# Patient Record
Sex: Female | Born: 1950 | Race: White | Hispanic: No | Marital: Married | Smoking: Never smoker
Health system: Southern US, Community
[De-identification: ages and names within clinical notes are randomized; demographics above are authoritative.]

## PROBLEM LIST (undated history)

## (undated) DIAGNOSIS — N189 Chronic kidney disease, unspecified: Secondary | ICD-10-CM

## (undated) DIAGNOSIS — Z8669 Personal history of other diseases of the nervous system and sense organs: Secondary | ICD-10-CM

## (undated) DIAGNOSIS — H269 Unspecified cataract: Secondary | ICD-10-CM

## (undated) DIAGNOSIS — E785 Hyperlipidemia, unspecified: Secondary | ICD-10-CM

## (undated) DIAGNOSIS — H9190 Unspecified hearing loss, unspecified ear: Secondary | ICD-10-CM

## (undated) DIAGNOSIS — Z9889 Other specified postprocedural states: Secondary | ICD-10-CM

## (undated) DIAGNOSIS — Z8619 Personal history of other infectious and parasitic diseases: Secondary | ICD-10-CM

## (undated) DIAGNOSIS — R112 Nausea with vomiting, unspecified: Secondary | ICD-10-CM

## (undated) DIAGNOSIS — M8580 Other specified disorders of bone density and structure, unspecified site: Secondary | ICD-10-CM

## (undated) DIAGNOSIS — T8859XA Other complications of anesthesia, initial encounter: Secondary | ICD-10-CM

## (undated) HISTORY — DX: Unspecified cataract: H26.9

## (undated) HISTORY — PX: INNER EAR SURGERY: SHX679

## (undated) HISTORY — DX: Unspecified hearing loss, unspecified ear: H91.90

## (undated) HISTORY — DX: Personal history of other infectious and parasitic diseases: Z86.19

## (undated) HISTORY — DX: Other specified disorders of bone density and structure, unspecified site: M85.80

## (undated) HISTORY — DX: Chronic kidney disease, unspecified: N18.9

## (undated) HISTORY — DX: Hyperlipidemia, unspecified: E78.5

## (undated) HISTORY — PX: COLONOSCOPY: SHX174

## (undated) HISTORY — DX: Personal history of other diseases of the nervous system and sense organs: Z86.69

---

## 1999-11-30 ENCOUNTER — Other Ambulatory Visit: Admission: RE | Admit: 1999-11-30 | Discharge: 1999-11-30 | Payer: Self-pay | Admitting: Obstetrics and Gynecology

## 2001-01-20 ENCOUNTER — Other Ambulatory Visit: Admission: RE | Admit: 2001-01-20 | Discharge: 2001-01-20 | Payer: Self-pay | Admitting: Obstetrics and Gynecology

## 2005-06-30 ENCOUNTER — Emergency Department (HOSPITAL_COMMUNITY): Admission: EM | Admit: 2005-06-30 | Discharge: 2005-06-30 | Payer: Self-pay | Admitting: Emergency Medicine

## 2006-03-06 ENCOUNTER — Encounter: Admission: RE | Admit: 2006-03-06 | Discharge: 2006-03-06 | Payer: Self-pay | Admitting: Internal Medicine

## 2006-04-15 ENCOUNTER — Other Ambulatory Visit: Admission: RE | Admit: 2006-04-15 | Discharge: 2006-04-15 | Payer: Self-pay | Admitting: Internal Medicine

## 2006-04-15 ENCOUNTER — Encounter: Payer: Self-pay | Admitting: Internal Medicine

## 2006-04-15 ENCOUNTER — Ambulatory Visit: Payer: Self-pay | Admitting: Internal Medicine

## 2006-04-15 LAB — CONVERTED CEMR LAB: Pap Smear: NORMAL

## 2008-07-01 ENCOUNTER — Ambulatory Visit: Payer: Self-pay | Admitting: Internal Medicine

## 2008-07-01 DIAGNOSIS — H9190 Unspecified hearing loss, unspecified ear: Secondary | ICD-10-CM | POA: Insufficient documentation

## 2008-07-01 DIAGNOSIS — M79609 Pain in unspecified limb: Secondary | ICD-10-CM | POA: Insufficient documentation

## 2008-07-01 DIAGNOSIS — G4700 Insomnia, unspecified: Secondary | ICD-10-CM

## 2008-07-05 ENCOUNTER — Telehealth: Payer: Self-pay | Admitting: Internal Medicine

## 2008-07-05 LAB — CONVERTED CEMR LAB
Basophils Absolute: 0 10*3/uL (ref 0.0–0.1)
Basophils Relative: 1 % (ref 0–1)
Free T4: 1.1 ng/dL (ref 0.89–1.80)
HCT: 39.9 % (ref 36.0–46.0)
Lymphocytes Relative: 36 % (ref 12–46)
MCHC: 32.3 g/dL (ref 30.0–36.0)
MCV: 91.5 fL (ref 78.0–100.0)
Monocytes Absolute: 0.5 10*3/uL (ref 0.1–1.0)
Neutro Abs: 3 10*3/uL (ref 1.7–7.7)
Neutrophils Relative %: 52 % (ref 43–77)
T3, Free: 2.9 pg/mL (ref 2.3–4.2)

## 2008-08-01 ENCOUNTER — Ambulatory Visit: Payer: Self-pay | Admitting: Internal Medicine

## 2008-08-09 ENCOUNTER — Telehealth: Payer: Self-pay | Admitting: Family Medicine

## 2008-08-16 ENCOUNTER — Telehealth: Payer: Self-pay | Admitting: Internal Medicine

## 2008-08-16 ENCOUNTER — Encounter: Payer: Self-pay | Admitting: Internal Medicine

## 2008-08-26 ENCOUNTER — Telehealth: Payer: Self-pay | Admitting: *Deleted

## 2008-08-26 ENCOUNTER — Ambulatory Visit: Payer: Self-pay | Admitting: Internal Medicine

## 2008-08-26 DIAGNOSIS — J069 Acute upper respiratory infection, unspecified: Secondary | ICD-10-CM | POA: Insufficient documentation

## 2008-10-03 ENCOUNTER — Telehealth: Payer: Self-pay | Admitting: Internal Medicine

## 2009-02-21 ENCOUNTER — Ambulatory Visit: Payer: Self-pay | Admitting: Internal Medicine

## 2009-02-21 LAB — CONVERTED CEMR LAB
AST: 23 units/L (ref 0–37)
Albumin: 4.2 g/dL (ref 3.5–5.2)
Bilirubin Urine: NEGATIVE
Bilirubin, Direct: 0.1 mg/dL (ref 0.0–0.3)
Blood in Urine, dipstick: NEGATIVE
CO2: 31 meq/L (ref 19–32)
Cholesterol: 227 mg/dL — ABNORMAL HIGH (ref 0–200)
Creatinine, Ser: 0.9 mg/dL (ref 0.4–1.2)
Eosinophils Relative: 2.9 % (ref 0.0–5.0)
GFR calc non Af Amer: 68.37 mL/min (ref >60)
Glucose, Bld: 102 mg/dL — ABNORMAL HIGH (ref 70–99)
Glucose, Urine, Semiquant: NEGATIVE
Monocytes Absolute: 0.4 10*3/uL (ref 0.1–1.0)
Monocytes Relative: 8.8 % (ref 3.0–12.0)
Neutrophils Relative %: 48.2 % (ref 43.0–77.0)
Sodium: 146 meq/L — ABNORMAL HIGH (ref 135–145)
Total Bilirubin: 0.8 mg/dL (ref 0.3–1.2)
Total Protein: 7.2 g/dL (ref 6.0–8.3)
Urobilinogen, UA: 0.2
VLDL: 29.8 mg/dL (ref 0.0–40.0)
WBC: 4.4 10*3/uL — ABNORMAL LOW (ref 4.5–10.5)

## 2009-02-28 ENCOUNTER — Encounter: Payer: Self-pay | Admitting: Internal Medicine

## 2009-02-28 ENCOUNTER — Other Ambulatory Visit: Admission: RE | Admit: 2009-02-28 | Discharge: 2009-02-28 | Payer: Self-pay | Admitting: Internal Medicine

## 2009-02-28 ENCOUNTER — Ambulatory Visit: Payer: Self-pay | Admitting: Internal Medicine

## 2009-02-28 DIAGNOSIS — R82998 Other abnormal findings in urine: Secondary | ICD-10-CM

## 2009-02-28 DIAGNOSIS — T50995A Adverse effect of other drugs, medicaments and biological substances, initial encounter: Secondary | ICD-10-CM

## 2009-02-28 DIAGNOSIS — N952 Postmenopausal atrophic vaginitis: Secondary | ICD-10-CM

## 2009-03-01 ENCOUNTER — Encounter: Payer: Self-pay | Admitting: Internal Medicine

## 2009-03-01 LAB — CONVERTED CEMR LAB
Creatinine, Urine: 175.8 mg/dL
Total Protein, Urine: 3

## 2009-03-13 ENCOUNTER — Telehealth: Payer: Self-pay | Admitting: *Deleted

## 2009-09-13 ENCOUNTER — Telehealth: Payer: Self-pay | Admitting: *Deleted

## 2010-03-16 ENCOUNTER — Telehealth: Payer: Self-pay | Admitting: *Deleted

## 2010-04-16 ENCOUNTER — Telehealth: Payer: Self-pay | Admitting: *Deleted

## 2010-04-25 ENCOUNTER — Ambulatory Visit: Payer: Self-pay | Admitting: Internal Medicine

## 2010-04-25 LAB — CONVERTED CEMR LAB
AST: 25 units/L (ref 0–37)
Albumin: 4.7 g/dL (ref 3.5–5.2)
Basophils Absolute: 0 10*3/uL (ref 0.0–0.1)
Basophils Relative: 0.9 % (ref 0.0–3.0)
Bilirubin, Direct: 0.1 mg/dL (ref 0.0–0.3)
Blood in Urine, dipstick: NEGATIVE
Chloride: 104 meq/L (ref 96–112)
Eosinophils Absolute: 0.2 10*3/uL (ref 0.0–0.7)
Eosinophils Relative: 3.9 % (ref 0.0–5.0)
GFR calc non Af Amer: 74.76 mL/min (ref >60)
Glucose, Bld: 100 mg/dL — ABNORMAL HIGH (ref 70–99)
Glucose, Urine, Semiquant: NEGATIVE
Hemoglobin: 13.9 g/dL (ref 12.0–15.0)
Ketones, urine, test strip: NEGATIVE
Lymphocytes Relative: 41 % (ref 12.0–46.0)
MCHC: 34.3 g/dL (ref 30.0–36.0)
Nitrite: NEGATIVE
Platelets: 321 10*3/uL (ref 150.0–400.0)
Protein, U semiquant: NEGATIVE
Sodium: 143 meq/L (ref 135–145)
Specific Gravity, Urine: >=1.03
TSH: 1.37 microintl units/mL (ref 0.35–5.50)
Total CHOL/HDL Ratio: 4
WBC Urine, dipstick: NEGATIVE

## 2010-05-01 ENCOUNTER — Ambulatory Visit: Payer: Self-pay | Admitting: Internal Medicine

## 2010-05-01 DIAGNOSIS — E785 Hyperlipidemia, unspecified: Secondary | ICD-10-CM | POA: Insufficient documentation

## 2010-06-06 ENCOUNTER — Emergency Department (HOSPITAL_BASED_OUTPATIENT_CLINIC_OR_DEPARTMENT_OTHER): Admission: EM | Admit: 2010-06-06 | Discharge: 2010-06-06 | Payer: Self-pay | Admitting: Emergency Medicine

## 2010-06-11 ENCOUNTER — Encounter (INDEPENDENT_AMBULATORY_CARE_PROVIDER_SITE_OTHER): Payer: Self-pay | Admitting: *Deleted

## 2010-08-21 ENCOUNTER — Encounter: Payer: Self-pay | Admitting: Internal Medicine

## 2010-09-24 ENCOUNTER — Telehealth: Payer: Self-pay | Admitting: *Deleted

## 2010-11-18 ENCOUNTER — Encounter: Payer: Self-pay | Admitting: Internal Medicine

## 2010-11-23 ENCOUNTER — Telehealth: Payer: Self-pay | Admitting: *Deleted

## 2010-11-27 NOTE — Progress Notes (Signed)
Summary: refill on zolpidem   Phone Note From Pharmacy   [id number] Reason for Call: Needs renewal Details for Reason: zolpidem 10mg  Summary of Call: last filled on 08/21/10 #30 Initial call taken by: Romualdo Bolk, CMA (AAMA),  September 24, 2010 10:56 AM  Follow-up for Phone Call        refill x 2   Additional Follow-up for Phone Call Additional follow up Details #1::        Rx faxed back to pharmacy. Additional Follow-up by: Romualdo Bolk, CMA (AAMA),  September 24, 2010 1:03 PM    Prescriptions: AMBIEN 10 MG TABS (ZOLPIDEM TARTRATE) 1 by mouth once daily  #30 x 1   Entered by:   Romualdo Bolk, CMA (AAMA)   Authorized by:   Madelin Headings MD   Signed by:   Romualdo Bolk, CMA (AAMA) on 09/24/2010   Method used:   Handwritten   RxID:   [id number]

## 2010-11-27 NOTE — Assessment & Plan Note (Signed)
Summary: CPX // RS   Vital Signs:  Patient profile:   60 year old female Menstrual status:  postmenopausal Height:      63.25 inches Weight:      167 pounds BMI:     29.46 Pulse rate:   84 / minute Pulse rhythm:   regular BP sitting:   110 / 80  (left arm) Cuff size:   regular  Vitals Entered By: Raechel Ache, RN (May 01, 2010 10:15 AM)  Nutrition Counseling: Patient's BMI is greater than 25 and therefore counseled on weight management options. CC: CPX, labs done. LMP - Character: unsure Menarche (age onset years): 11    Menstrual Status postmenopausal Last PAP Result NEGATIVE FOR INTRAEPITHELIAL LESIONS OR MALIGNANCY.   History of Present Illness: Tracy Pena comes in today  for preventive visit . Since last visit  here  there have been no major changes in health status   Sleep: taking ambien every night   and helps .  Not as much exercise and eating more sweets at times . no major health concerns . To get mammo next week  Never had a colonscopy although referred in the past .  delayed for various reasons.  Preventive Screening-Counseling & Management  Alcohol-Tobacco     Alcohol drinks/day: 1     Alcohol type: wine     Smoking Status: never  Caffeine-Diet-Exercise     Caffeine use/day: 2 cups coffee     Does Patient Exercise: yes     Type of exercise: biking, walking     Times/week: 3  Hep-HIV-STD-Contraception     Dental Visit-last 6 months yes     Sun Exposure-Excessive: no  Safety-Violence-Falls     Seat Belt Use: yes     Firearms in the Home: firearms in the home     Firearm Counseling: not indicated; uses recommended firearm safety measures     Smoke Detectors: yes      Blood Transfusions:  no.    Allergies: No Known Drug Allergies  Past History:  Past medical, surgical, family and social histories (including risk factors) reviewed, and no changes noted (except as noted below).  Past Medical History: Chickenpox as a  child childbirth  Decreased hearing       Past Surgical History: Reviewed history from 07/01/2008 and no changes required. G3 P3 Ear Surgery at [facility] for cholesteatoma  artificial ear drum  Family History: Reviewed history from 02/28/2009 and no changes required. Family History Lung cancer- Mother died Father older age Thyroid  daughter graves.   other thyroid in family No osteoporosis  No premature vascular disease  No colon cancer   Social History: Reviewed history from 02/28/2009 and no changes required. Former Smoker- tried retired x 2 years  HHof 2  and dog    used to sleep 6-8 hours pm  help take care of grandchild born in February. off an on   Caffeine use/day:  2 cups coffee Dental Care w/in 6 mos.:  yes Sun Exposure-Excessive:  no Blood Transfusions:  no  Review of Systems  The patient denies anorexia, fever, weight loss, vision loss, hoarseness, chest pain, syncope, dyspnea on exertion, peripheral edema, prolonged cough, headaches, hemoptysis, abdominal pain, melena, hematochezia, severe indigestion/heartburn, hematuria, incontinence, genital sores, muscle weakness, suspicious skin lesions, transient blindness, difficulty walking, depression, unusual weight change, abnormal bleeding, enlarged lymph nodes, angioedema, and breast masses.         decrease hearing no change  Physical Exam General Appearance: well  developed, well nourished, no acute distress Eyes: conjunctiva and lids normal, PERRLA, EOMI, fWNL Ears, Nose, Mouth, Throat: TM clear, nares clear, oral exam WNL Neck: supple, no lymphadenopathy, no thyromegaly, no JVD Respiratory: clear to auscultation and percussion, respiratory effort normal Cardiovascular: regular rate and rhythm, S1-S2, no murmur, rub or gallop, no bruits, peripheral pulses normal and symmetric, no cyanosis, clubbing, edema or varicosities Chest: no scars, masses, tenderness; no asymmetry, skin changes, nipple discharge    Gastrointestinal: soft, non-tender; no hepatosplenomegaly, masses; active bowel sounds all quadrants,  Lymphatic: no cervical, axillary or inguinal adenopathy Musculoskeletal: gait normal, muscle tone and strength WNL, no joint swelling, effusions, discoloration, crepitus  Skin: clear, good turgor, color WNL, no  lesions, or ulcerations Neurologic: normal mental status, normal reflexes, normal strength, sensation, and motion Psychiatric: alert; oriented to person, place and time Other Exam:  see labs   elevated TG  ldl  rest normal     Impression & Recommendations:  Problem # 1:  Preventive Health Care (ICD-V70.0) Discussed nutrition,exercise,diet,healthy weight, vitamin D and calcium.  never had dexa no family hx  will get dexa next year   needs colonscopy   Problem # 2:  SLEEPLESSNESS (ICD-780.52) counseled  continue  Her updated medication list for this problem includes:    Ambien 10 Mg Tabs (Zolpidem tartrate) .Marland Kitchen... 1 by mouth once daily  Problem # 3:  HYPERLIPIDEMIA (ICD-272.4) counseled   Labs Reviewed: SGOT: 25 (04/25/2010)   SGPT: 20 (04/25/2010)   HDL:64.80 (04/25/2010), 55.10 (02/21/2009)  Chol:247 (04/25/2010), 227 (02/21/2009)  Trig:186.0 (04/25/2010), 149.0 (02/21/2009)  Complete Medication List: 1)  Ambien 10 Mg Tabs (Zolpidem tartrate) .Marland Kitchen.. 1 by mouth once daily  Other Orders: Gastroenterology Referral (GI)  Patient Instructions: 1)  get dexa next year  call for  RX .  2)  Increase exercise  ,decrease sugars and processed foods. 3)  weight loss would help. 4)  Will refer for screening colonoscopy 5)  Cpx with labs in a year   or as needed .       Preventive Care Screening  Prior Values:    Pap Smear:  NEGATIVE FOR INTRAEPITHELIAL LESIONS OR MALIGNANCY. (02/28/2009)    Mammogram:  normal (10/28/2005)    Last Tetanus Booster:  Historical (10/28/2004)

## 2010-11-27 NOTE — Progress Notes (Signed)
Summary: [employer] denied coverage of lunesta  Phone Note From Other Clinic   Caller: [employer] Call For: Tracy Pena  Summary of Call: [employer] denied coverage of lunesta.  Paperwork for denial sent to [facility]  Initial call taken by: Roselle Locus,  August 16, 2008 9:39 AM  Follow-up for Phone Call        I called pt's home number and pt aware of this and wants to try ambien. [facility] [address] Per Dr. Fabian Sharp- Ambien 10mg  1 by mouth at bedtime with 0 refills. Rx called in. Follow-up by: Romualdo Bolk, CMA,  August 18, 2008 12:25 PM    New/Updated Medications: AMBIEN 10 MG TABS (ZOLPIDEM TARTRATE) 1 by mouth once daily   Prescriptions: AMBIEN 10 MG TABS (ZOLPIDEM TARTRATE) 1 by mouth once daily  #30 x 0   Entered by:   Romualdo Bolk, CMA   Authorized by:   Madelin Headings MD   Signed by:   Romualdo Bolk, CMA on 08/18/2008   Method used:   Telephoned to ...       [facility] [phone]* (retail)       [address]       [place], [place]  [address]       Ph: [phone] or [phone]       Fax: [phone]   RxID:   [id number]

## 2010-11-27 NOTE — Progress Notes (Signed)
Summary: refill on zolpidem  Phone Note From Pharmacy   [id number] Reason for Call: Needs renewal Details for Reason: Zolpidem 10mg  Summary of Call: last filled on 03/16/10 #30 Initial call taken by: Romualdo Bolk, CMA (AAMA),  April 16, 2010 5:13 PM  Follow-up for Phone Call        ok to refill x 5  Follow-up by: Madelin Headings MD,  April 17, 2010 6:19 PM  Additional Follow-up for Phone Call Additional follow up Details #1::        Rx faxed to pharmacy. Additional Follow-up by: Romualdo Bolk, CMA (AAMA),  April 18, 2010 9:27 AM    Prescriptions: AMBIEN 10 MG TABS (ZOLPIDEM TARTRATE) 1 by mouth once daily  #30 x 4   Entered by:   Romualdo Bolk, CMA (AAMA)   Authorized by:   Madelin Headings MD   Signed by:   Romualdo Bolk, CMA (AAMA) on 04/18/2010   Method used:   Handwritten   RxID:   [id number]

## 2010-11-27 NOTE — Progress Notes (Signed)
Summary: refill on zolpidem  Phone Note From Pharmacy   [id number] Reason for Call: Needs renewal Details for Reason: zolpidem 10mg  Summary of Call: last filled on 02/12/10 #30 Initial call taken by: Romualdo Bolk, CMA (AAMA),  Mar 16, 2010 11:56 AM  Follow-up for Phone Call        ok x 1  Follow-up by: Madelin Headings MD,  Mar 16, 2010 4:49 PM  Additional Follow-up for Phone Call Additional follow up Details #1::        Rx faxed back to pharmacy. Additional Follow-up by: Romualdo Bolk, CMA (AAMA),  Mar 16, 2010 4:53 PM    Prescriptions: AMBIEN 10 MG TABS (ZOLPIDEM TARTRATE) 1 by mouth once daily  #30 x 0   Entered by:   Romualdo Bolk, CMA (AAMA)   Authorized by:   Madelin Headings MD   Signed by:   Romualdo Bolk, CMA (AAMA) on 03/16/2010   Method used:   Handwritten   RxID:   [id number]

## 2010-11-27 NOTE — Letter (Signed)
Summary: Previsit letter  [facility]  [address] [place], [place] [address]   Phone: [phone]  Fax: [phone]       06/11/2010 MRN: [id number]  Tracy Pena [address] [place], [place]  [address]  Dear [name],  Welcome to the Gastroenterology Division at [facility].    You are scheduled to see a nurse for your pre-procedure visit on 07/11/2010 at 10:30AM on the 3rd floor at [facility], [address]  We ask that you try to arrive at our office 15 minutes prior to your appointment time to allow for check-in.  Your nurse visit will consist of discussing your medical and surgical history, your immediate family medical history, and your medications.    Please bring a complete list of all your medications or, if you prefer, bring the medication bottles and we will list them.  We will need to be aware of both prescribed and over the counter drugs.  We will need to know exact dosage information as well.  If you are on blood thinners (Coumadin, Plavix, Aggrenox, Ticlid, etc.) please call our office today/prior to your appointment, as we need to consult with your physician about holding your medication.   Please be prepared to read and sign documents such as consent forms, a financial agreement, and acknowledgement forms.  If necessary, and with your consent, a friend or relative is welcome to sit-in on the nurse visit with you.  Please bring your insurance card so that we may make a copy of it.  If your insurance requires a referral to see a specialist, please bring your referral form from your primary care physician.  No co-pay is required for this nurse visit.     If you cannot keep your appointment, please call [phone] to cancel or reschedule prior to your appointment date.  This allows [place] the opportunity to schedule an appointment for another patient in need of care.    Thank you for choosing  Gastroenterology for your  medical needs.  We appreciate the opportunity to care for you.  Please visit [place] at our website  to learn more about our practice.                     Sincerely.                                                                                                                   The Gastroenterology Division

## 2010-11-29 NOTE — Progress Notes (Signed)
Summary: refill on zolpidem  Phone Note From Pharmacy   [id number] Reason for Call: Needs renewal Details for Reason: zolpidem 10mg  Summary of Call: last filled on 10/25/10 #30 Initial call taken by: Romualdo Bolk, CMA (AAMA),  November 23, 2010 4:13 PM  Follow-up for Phone Call        ok x 6   then due for yearly check  before  runs out. Follow-up by: Madelin Headings MD,  November 23, 2010 4:52 PM  Additional Follow-up for Phone Call Additional follow up Details #1::        Rx faxed to pharmacy. Additional Follow-up by: Romualdo Bolk, CMA (AAMA),  November 23, 2010 4:59 PM    Prescriptions: AMBIEN 10 MG TABS (ZOLPIDEM TARTRATE) 1 by mouth once daily  #30 x 5   Entered by:   Romualdo Bolk, CMA (AAMA)   Authorized by:   Madelin Headings MD   Signed by:   Romualdo Bolk, CMA (AAMA) on 11/23/2010   Method used:   Handwritten   RxID:   [id number]

## 2011-02-06 ENCOUNTER — Ambulatory Visit (AMBULATORY_SURGERY_CENTER): Payer: 59 | Admitting: *Deleted

## 2011-02-06 VITALS — Ht 64.0 in | Wt 163.0 lb

## 2011-02-06 DIAGNOSIS — Z1211 Encounter for screening for malignant neoplasm of colon: Secondary | ICD-10-CM

## 2011-02-06 MED ORDER — PEG-KCL-NACL-NASULF-NA ASC-C 100 G PO SOLR: ORAL | Status: DC

## 2011-02-19 ENCOUNTER — Encounter: Payer: Self-pay | Admitting: Internal Medicine

## 2011-02-20 ENCOUNTER — Encounter: Payer: Self-pay | Admitting: Internal Medicine

## 2011-02-20 ENCOUNTER — Ambulatory Visit (AMBULATORY_SURGERY_CENTER): Payer: 59 | Admitting: Internal Medicine

## 2011-02-20 VITALS — BP 149/62 | HR 75 | Temp 98.2°F | Resp 16 | Ht 64.5 in | Wt 160.0 lb

## 2011-02-20 DIAGNOSIS — Z1211 Encounter for screening for malignant neoplasm of colon: Secondary | ICD-10-CM

## 2011-02-20 MED ORDER — SODIUM CHLORIDE 0.9 % IV SOLN: 500.0000 mL | INTRAVENOUS | Status: DC

## 2011-02-20 NOTE — Patient Instructions (Signed)
Findings:  Normal  Recommendations:  High Fiber Diet                                   Repeat colonoscopy in 10 years

## 2011-02-21 ENCOUNTER — Telehealth: Payer: Self-pay | Admitting: *Deleted

## 2011-02-21 NOTE — Telephone Encounter (Signed)

## 2011-05-22 ENCOUNTER — Telehealth: Payer: Self-pay | Admitting: *Deleted

## 2011-05-22 MED ORDER — ZOLPIDEM TARTRATE 10 MG PO TABS: 10.0000 mg | ORAL_TABLET | Freq: Every evening | ORAL | Status: DC | PRN

## 2011-05-22 NOTE — Telephone Encounter (Signed)
Zolpidem 10mg  last filled on 04/23/11 #30 Nov 11/12 lov 05/01/10

## 2011-05-22 NOTE — Telephone Encounter (Signed)
Ok to refill x 3  Until next appt.  

## 2011-05-27 ENCOUNTER — Telehealth: Payer: Self-pay | Admitting: Internal Medicine

## 2011-05-27 NOTE — Telephone Encounter (Signed)
Pt needs refill on zolpidem (AMBIEN) 10 MG tablet is completely out and has not slept in days.

## 2011-05-27 NOTE — Telephone Encounter (Signed)
Pt aware that we called this in on 7/25. I told pt that I would call the pharmacy to check on this for her. I spoke with Maralyn Sago and they didn't receive it. I called it in.

## 2011-08-27 ENCOUNTER — Telehealth: Payer: Self-pay | Admitting: *Deleted

## 2011-08-27 NOTE — Telephone Encounter (Signed)
Refill on zolpidem 10 mg

## 2011-08-28 MED ORDER — ZOLPIDEM TARTRATE 10 MG PO TABS: 10.0000 mg | ORAL_TABLET | Freq: Every evening | ORAL | Status: DC | PRN

## 2011-08-29 ENCOUNTER — Other Ambulatory Visit (INDEPENDENT_AMBULATORY_CARE_PROVIDER_SITE_OTHER): Payer: 59

## 2011-08-29 DIAGNOSIS — Z0000 Encounter for general adult medical examination without abnormal findings: Secondary | ICD-10-CM

## 2011-08-29 LAB — POCT URINALYSIS DIPSTICK
Bilirubin, UA: NEGATIVE
Blood, UA: NEGATIVE
Ketones, UA: NEGATIVE
Leukocytes, UA: NEGATIVE
Nitrite, UA: NEGATIVE
Urobilinogen, UA: 0.2

## 2011-08-29 LAB — CBC WITH DIFFERENTIAL/PLATELET
Basophils Relative: 0.5 % (ref 0.0–3.0)
MCHC: 34.2 g/dL (ref 30.0–36.0)
Monocytes Relative: 10.6 % (ref 3.0–12.0)
Platelets: 309 10*3/uL (ref 150.0–400.0)
RBC: 4.36 Mil/uL (ref 3.87–5.11)
WBC: 5.6 10*3/uL (ref 4.5–10.5)

## 2011-08-29 LAB — BASIC METABOLIC PANEL
BUN: 20 mg/dL (ref 6–23)
CO2: 27 mEq/L (ref 19–32)
Chloride: 104 mEq/L (ref 96–112)
Glucose, Bld: 85 mg/dL (ref 70–99)
Potassium: 5.2 mEq/L — ABNORMAL HIGH (ref 3.5–5.1)
Sodium: 141 mEq/L (ref 135–145)

## 2011-08-29 LAB — HEPATIC FUNCTION PANEL
Alkaline Phosphatase: 70 U/L (ref 39–117)
Total Bilirubin: 0.5 mg/dL (ref 0.3–1.2)
Total Protein: 7.5 g/dL (ref 6.0–8.3)

## 2011-08-29 LAB — LIPID PANEL
Total CHOL/HDL Ratio: 3
Triglycerides: 170 mg/dL — ABNORMAL HIGH (ref 0.0–149.0)

## 2011-08-29 LAB — LDL CHOLESTEROL, DIRECT: Direct LDL: 139 mg/dL

## 2011-09-17 ENCOUNTER — Encounter: Payer: Self-pay | Admitting: Internal Medicine

## 2011-09-17 ENCOUNTER — Ambulatory Visit (INDEPENDENT_AMBULATORY_CARE_PROVIDER_SITE_OTHER): Payer: 59 | Admitting: Internal Medicine

## 2011-09-17 VITALS — BP 120/70 | HR 60 | Ht 63.25 in | Wt 165.0 lb

## 2011-09-17 DIAGNOSIS — H6690 Otitis media, unspecified, unspecified ear: Secondary | ICD-10-CM

## 2011-09-17 DIAGNOSIS — E785 Hyperlipidemia, unspecified: Secondary | ICD-10-CM

## 2011-09-17 DIAGNOSIS — Z136 Encounter for screening for cardiovascular disorders: Secondary | ICD-10-CM

## 2011-09-17 DIAGNOSIS — Z0000 Encounter for general adult medical examination without abnormal findings: Secondary | ICD-10-CM

## 2011-09-17 DIAGNOSIS — R945 Abnormal results of liver function studies: Secondary | ICD-10-CM

## 2011-09-17 DIAGNOSIS — H9190 Unspecified hearing loss, unspecified ear: Secondary | ICD-10-CM

## 2011-09-17 MED ORDER — AMOXICILLIN-POT CLAVULANATE 875-125 MG PO TABS: 1.0000 | ORAL_TABLET | Freq: Two times a day (BID) | ORAL | Status: AC

## 2011-09-17 NOTE — Patient Instructions (Addendum)
Take antibiotic.  For ear infection.   Continue lifestyle intervention healthy eating and exercise . Your exam is normal today except for your ear infection.  If not  Better at end of medicine we may need to have you  See ear doctor.

## 2011-09-17 NOTE — Progress Notes (Signed)
  Subjective:    Patient ID: Tracy Pena, female    DOB: 1950/11/04, 60 y.o.   MRN: [id number]  HPI  Patient comes in today for Preventive Health Care visit  Since last visit  Update: ocass burning stomach. No NVD no bleeding or progression Stiches  In hand  Had td (  68 ed. ) Ear declease hearing and some drainage   Sleep issues  Review of Systems ROS:  GEN/ HEENTNo fever, significant weight changes sweats headaches vision problems  CV/ PULM; No chest pain shortness of breath cough, syncope,edema  change in exercise tolerance. GI /GU: No adominal pain, vomiting, change in bowel habits. No blood in the stool. No significant GU symptoms. SKIN/HEME: ,no acute skin rashes suspicious lesions or bleeding. No lymphadenopathy, nodules, masses.  NEURO/ PSYCH:  No neurologic signs such as weakness numbness No depression anxiety. IMM/ Allergy: No unusual infections.  Allergy .   REST of 12 system review negative  Past history family history social history reviewed in the  Old electronic medical record. See media  data  Epic has not been updated yet     Objective:   Physical Exam Physical Exam: Vital signs reviewed WUJ:WJXB is a well-developed well-nourished alert cooperative  white female who appears her stated age in no acute distress.  HEENT: normocephalic  traumatic , Eyes: PERRL EOM's full, conjunctiva clear, Nares: paten,t no deformity discharge or tenderness., congested  Ears: no deformity EAC's discharge in ef tear  TMs distorted  landmarks. Mouth: clear OP, no lesions, edema.  Moist mucous membranes. Dentition in adequate repair. NECK: supple without masses, thyromegaly or bruits. CHEST/PULM:  Clear to auscultation and percussion breath sounds equal no wheeze , rales or rhonchi. No chest wall deformities or tenderness. CV: PMI is nondisplaced, S1 S2 no gallops, murmurs, rubs. Peripheral pulses are full without delay.No JVD .  Breast: normal by inspection . No dimpling,  discharge, masses, tenderness or discharge .  ABDOMEN: Bowel sounds normal nontender  No guard or rebound, no hepato splenomegal no CVA tenderness.  No hernia. Extremtities:  No clubbing cyanosis or edema, no acute joint swelling or redness no focal atrophy NEURO:  Oriented x3, cranial nerves 3-12 appear to be intact, no obvious focal weakness,gait within normal limits no abnormal reflexes or asymmetrical SKIN: No acute rashes normal turgor, color, no bruising or petechiae. PSYCH: Oriented, good eye contact, no obvious depression anxiety, cognition and judgment appear normal.  Lab Results  Component Value Date   WBC 5.6 08/29/2011   HGB 13.6 08/29/2011   HCT 39.8 08/29/2011   PLT 309.0 08/29/2011   GLUCOSE 85 08/29/2011   CHOL 226* 08/29/2011   TRIG 170.0* 08/29/2011   HDL 64.80 08/29/2011   LDLDIRECT 139.0 08/29/2011   ALT 29 08/29/2011   AST 42* 08/29/2011   NA 141 08/29/2011   K 5.2* 08/29/2011   CL 104 08/29/2011   CREATININE 0.8 08/29/2011   BUN 20 08/29/2011   CO2 27 08/29/2011   TSH 1.52 08/29/2011   EKG NSR nio acute changes     Assessment & Plan:  Preventive Health Care Counseled regarding healthy nutrition, exercise, sleep, injury prevention, calcium vit d and healthy weight . Last pap normal 2010 due next year  Ear infection hx of cholesteatoma surgery   Treat and if not better needs ent to see LFts minor change will repeat  Sleep stable  LIPIDS  Counseled.  lifestyle intervention healthy eating and exercise .

## 2011-09-22 DIAGNOSIS — Z0000 Encounter for general adult medical examination without abnormal findings: Secondary | ICD-10-CM | POA: Insufficient documentation

## 2011-09-30 ENCOUNTER — Other Ambulatory Visit: Payer: Self-pay | Admitting: *Deleted

## 2011-09-30 NOTE — Telephone Encounter (Signed)
Ok x 2  

## 2011-09-30 NOTE — Telephone Encounter (Signed)
Refill on ambien 10mg  LOV 09/17/11 NOV 10/17/11

## 2011-10-01 MED ORDER — ZOLPIDEM TARTRATE 10 MG PO TABS: 10.0000 mg | ORAL_TABLET | Freq: Every evening | ORAL | Status: DC | PRN

## 2011-10-01 NOTE — Telephone Encounter (Signed)
Rx called in 

## 2011-10-10 DIAGNOSIS — R945 Abnormal results of liver function studies: Secondary | ICD-10-CM | POA: Insufficient documentation

## 2011-10-11 ENCOUNTER — Encounter: Payer: Self-pay | Admitting: Internal Medicine

## 2011-10-17 ENCOUNTER — Other Ambulatory Visit (INDEPENDENT_AMBULATORY_CARE_PROVIDER_SITE_OTHER): Payer: 59

## 2011-10-17 DIAGNOSIS — C712 Malignant neoplasm of temporal lobe: Secondary | ICD-10-CM

## 2011-10-17 DIAGNOSIS — R945 Abnormal results of liver function studies: Secondary | ICD-10-CM

## 2011-10-17 DIAGNOSIS — K769 Liver disease, unspecified: Secondary | ICD-10-CM

## 2011-10-17 LAB — HEPATIC FUNCTION PANEL
Bilirubin, Direct: 0 mg/dL (ref 0.0–0.3)
Total Bilirubin: 0.5 mg/dL (ref 0.3–1.2)
Total Protein: 6.9 g/dL (ref 6.0–8.3)

## 2011-10-21 ENCOUNTER — Encounter: Payer: Self-pay | Admitting: *Deleted

## 2011-10-21 NOTE — Progress Notes (Signed)
Quick Note:    Letter sent to pt.  ______

## 2011-10-28 ENCOUNTER — Telehealth: Payer: Self-pay | Admitting: *Deleted

## 2011-10-28 NOTE — Telephone Encounter (Signed)
Pt requesting new rx of estrogen cream(unsure of the name).  Pt states she was prescribed this medication a few years ago and failed to mentioned this at her recent ov with Dr. Fabian Sharp.

## 2011-10-30 MED ORDER — ESTROGENS, CONJUGATED 0.625 MG/GM VA CREA: TOPICAL_CREAM | Freq: Every day | VAGINAL | Status: AC

## 2011-10-30 NOTE — Telephone Encounter (Signed)
I am not sure which cream she has not but we can prescribe Premarin vaginal cream  use a half a gram or a fingertip amount 3 times a week or as directed Dispense one tube refill x3

## 2011-10-30 NOTE — Telephone Encounter (Signed)
Rx sent to pharmacy   

## 2011-11-18 ENCOUNTER — Other Ambulatory Visit: Payer: Self-pay

## 2011-11-18 MED ORDER — ZOLPIDEM TARTRATE 10 MG PO TABS: 10.0000 mg | ORAL_TABLET | Freq: Every evening | ORAL | Status: DC | PRN

## 2011-11-18 NOTE — Telephone Encounter (Signed)
Rx called in to pharmacy for zolpidem 10 mg.

## 2012-03-30 ENCOUNTER — Other Ambulatory Visit: Payer: Self-pay

## 2012-03-30 MED ORDER — ZOLPIDEM TARTRATE 10 MG PO TABS: 10.0000 mg | ORAL_TABLET | Freq: Every evening | ORAL | Status: DC | PRN

## 2012-03-30 NOTE — Telephone Encounter (Signed)
Ok to refill x 6   have her schedule preventive visit and lab for November 2013 before she runs out.

## 2012-03-30 NOTE — Telephone Encounter (Signed)
Rx request for zolpidem tartrate 10 mg.  Pt last seen 11/18/2011.  #30x3 rf.  Pt last seen 09/17/11.  Pls advise.

## 2012-03-30 NOTE — Telephone Encounter (Signed)
Rx sent to pharmacy. Pt aware to schedule cpx and labs.

## 2012-09-11 ENCOUNTER — Other Ambulatory Visit (INDEPENDENT_AMBULATORY_CARE_PROVIDER_SITE_OTHER): Payer: 59

## 2012-09-11 DIAGNOSIS — Z0000 Encounter for general adult medical examination without abnormal findings: Secondary | ICD-10-CM

## 2012-09-11 LAB — BASIC METABOLIC PANEL
Chloride: 107 mEq/L (ref 96–112)
Creatinine, Ser: 0.9 mg/dL (ref 0.4–1.2)
Glucose, Bld: 100 mg/dL — ABNORMAL HIGH (ref 70–99)
Sodium: 141 mEq/L (ref 135–145)

## 2012-09-11 LAB — POCT URINALYSIS DIPSTICK
Blood, UA: NEGATIVE
Protein, UA: NEGATIVE
Spec Grav, UA: 1.025
Urobilinogen, UA: 0.2
pH, UA: 5.5

## 2012-09-11 LAB — HEPATIC FUNCTION PANEL
ALT: 19 U/L (ref 0–35)
AST: 22 U/L (ref 0–37)
Albumin: 4.3 g/dL (ref 3.5–5.2)
Alkaline Phosphatase: 55 U/L (ref 39–117)
Total Protein: 7.1 g/dL (ref 6.0–8.3)

## 2012-09-11 LAB — CBC WITH DIFFERENTIAL/PLATELET
Basophils Relative: 0.7 % (ref 0.0–3.0)
HCT: 39.4 % (ref 36.0–46.0)
Hemoglobin: 13.1 g/dL (ref 12.0–15.0)
Lymphocytes Relative: 43.8 % (ref 12.0–46.0)
Lymphs Abs: 2 10*3/uL (ref 0.7–4.0)
Monocytes Relative: 8.5 % (ref 3.0–12.0)
Neutro Abs: 1.9 10*3/uL (ref 1.4–7.7)
RBC: 4.34 Mil/uL (ref 3.87–5.11)

## 2012-09-11 LAB — LIPID PANEL
Total CHOL/HDL Ratio: 3
Triglycerides: 145 mg/dL (ref 0.0–149.0)

## 2012-09-11 LAB — LDL CHOLESTEROL, DIRECT: Direct LDL: 151.2 mg/dL

## 2012-09-11 LAB — TSH: TSH: 2.07 u[IU]/mL (ref 0.35–5.50)

## 2012-09-18 ENCOUNTER — Other Ambulatory Visit (HOSPITAL_COMMUNITY)
Admission: RE | Admit: 2012-09-18 | Discharge: 2012-09-18 | Disposition: A | Discharge status: Home / Self Care | Payer: 59 | Source: Ambulatory Visit | Attending: Internal Medicine | Admitting: Internal Medicine

## 2012-09-18 ENCOUNTER — Encounter: Payer: Self-pay | Admitting: Internal Medicine

## 2012-09-18 ENCOUNTER — Ambulatory Visit (INDEPENDENT_AMBULATORY_CARE_PROVIDER_SITE_OTHER): Payer: 59 | Admitting: Internal Medicine

## 2012-09-18 VITALS — BP 150/82 | HR 77 | Temp 97.9°F | Ht 62.75 in | Wt 149.0 lb

## 2012-09-18 DIAGNOSIS — N952 Postmenopausal atrophic vaginitis: Secondary | ICD-10-CM

## 2012-09-18 DIAGNOSIS — H9190 Unspecified hearing loss, unspecified ear: Secondary | ICD-10-CM

## 2012-09-18 DIAGNOSIS — Z01419 Encounter for gynecological examination (general) (routine) without abnormal findings: Secondary | ICD-10-CM | POA: Insufficient documentation

## 2012-09-18 DIAGNOSIS — Z1151 Encounter for screening for human papillomavirus (HPV): Secondary | ICD-10-CM | POA: Insufficient documentation

## 2012-09-18 DIAGNOSIS — E785 Hyperlipidemia, unspecified: Secondary | ICD-10-CM

## 2012-09-18 DIAGNOSIS — Z8669 Personal history of other diseases of the nervous system and sense organs: Secondary | ICD-10-CM

## 2012-09-18 DIAGNOSIS — Z0000 Encounter for general adult medical examination without abnormal findings: Secondary | ICD-10-CM

## 2012-09-18 DIAGNOSIS — G4700 Insomnia, unspecified: Secondary | ICD-10-CM

## 2012-09-18 DIAGNOSIS — R030 Elevated blood-pressure reading, without diagnosis of hypertension: Secondary | ICD-10-CM

## 2012-09-18 MED ORDER — TRAZODONE HCL 50 MG PO TABS: 25.0000 mg | ORAL_TABLET | Freq: Every evening | ORAL | Status: DC | PRN

## 2012-09-18 MED ORDER — ESTRADIOL 0.1 MG/GM VA CREA: TOPICAL_CREAM | VAGINAL | Status: DC

## 2012-09-18 MED ORDER — ZOLPIDEM TARTRATE 10 MG PO TABS: ORAL_TABLET | ORAL | Status: DC

## 2012-09-18 NOTE — Patient Instructions (Addendum)
Continue lifestyle intervention healthy eating and exercise . Intensify to help cholesterol. decrease the ambien to 5 mg    And then switch over the trazadone  May take a few weeks to see if can help.   Can try  Change estrogen topical  To estradiol and use least amount needed.    Get mammogram.   Get bp readings and if  Elevated  3 -5 separate times  FU with [place]. 140/90 is elevated .  Preventive Care for Adults, Female A healthy lifestyle and preventive care can promote health and wellness. Preventive health guidelines for women include the following key practices.  A routine yearly physical is a good way to check with your caregiver about your health and preventive screening. It is a chance to share any concerns and updates on your health, and to receive a thorough exam.  Visit your dentist for a routine exam and preventive care every 6 months. Brush your teeth twice a day and floss once a day. Good oral hygiene prevents tooth decay and gum disease.  The frequency of eye exams is based on your age, health, family medical history, use of contact lenses, and other factors. Follow your caregiver's recommendations for frequency of eye exams.  Eat a healthy diet. Foods like vegetables, fruits, whole grains, low-fat dairy products, and lean protein foods contain the nutrients you need without too many calories. Decrease your intake of foods high in solid fats, added sugars, and salt. Eat the right amount of calories for you.Get information about a proper diet from your caregiver, if necessary.  Regular physical exercise is one of the most important things you can do for your health. Most adults should get at least 150 minutes of moderate-intensity exercise (any activity that increases your heart rate and causes you to sweat) each week. In addition, most adults need muscle-strengthening exercises on 2 or more days a week.  Maintain a healthy weight. The body mass index (BMI) is a screening tool to  identify possible weight problems. It provides an estimate of body fat based on height and weight. Your caregiver can help determine your BMI, and can help you achieve or maintain a healthy weight.For adults 20 years and older:  A BMI below 18.5 is considered underweight.  A BMI of 18.5 to 24.9 is normal.  A BMI of 25 to 29.9 is considered overweight.  A BMI of 30 and above is considered obese.  Maintain normal blood lipids and cholesterol levels by exercising and minimizing your intake of saturated fat. Eat a balanced diet with plenty of fruit and vegetables. Blood tests for lipids and cholesterol should begin at age 5 and be repeated every 5 years. If your lipid or cholesterol levels are high, you are over 50, or you are at high risk for heart disease, you may need your cholesterol levels checked more frequently.Ongoing high lipid and cholesterol levels should be treated with medicines if diet and exercise are not effective.  If you smoke, find out from your caregiver how to quit. If you do not use tobacco, do not start.  If you are pregnant, do not drink alcohol. If you are breastfeeding, be very cautious about drinking alcohol. If you are not pregnant and choose to drink alcohol, do not exceed 1 drink per day. One drink is considered to be 12 ounces (355 mL) of beer, 5 ounces (148 mL) of wine, or 1.5 ounces (44 mL) of liquor.  Avoid use of street drugs. Do not share needles  with anyone. Ask for help if you need support or instructions about stopping the use of drugs.  High blood pressure causes heart disease and increases the risk of stroke. Your blood pressure should be checked at least every 1 to 2 years. Ongoing high blood pressure should be treated with medicines if weight loss and exercise are not effective.  If you are 27 to 61 years old, ask your caregiver if you should take aspirin to prevent strokes.  Diabetes screening involves taking a blood sample to check your fasting blood  sugar level. This should be done once every 3 years, after age 63, if you are within normal weight and without risk factors for diabetes. Testing should be considered at a younger age or be carried out more frequently if you are overweight and have at least 1 risk factor for diabetes.  Breast cancer screening is essential preventive care for women. You should practice "breast self-awareness." This means understanding the normal appearance and feel of your breasts and may include breast self-examination. Any changes detected, no matter how small, should be reported to a caregiver. Women in their 39s and 30s should have a clinical breast exam (CBE) by a caregiver as part of a regular health exam every 1 to 3 years. After age 31, women should have a CBE every year. Starting at age 39, women should consider having a mammography (breast X-ray test) every year. Women who have a family history of breast cancer should talk to their caregiver about genetic screening. Women at a high risk of breast cancer should talk to their caregivers about having magnetic resonance imaging (MRI) and a mammography every year.  The Pap test is a screening test for cervical cancer. A Pap test can show cell changes on the cervix that might become cervical cancer if left untreated. A Pap test is a procedure in which cells are obtained and examined from the lower end of the uterus (cervix).  Women should have a Pap test starting at age 64.  Between ages 81 and 53, Pap tests should be repeated every 2 years.  Beginning at age 58, you should have a Pap test every 3 years as long as the past 3 Pap tests have been normal.  Some women have medical problems that increase the chance of getting cervical cancer. Talk to your caregiver about these problems. It is especially important to talk to your caregiver if a new problem develops soon after your last Pap test. In these cases, your caregiver may recommend more frequent screening and Pap  tests.  The above recommendations are the same for women who have or have not gotten the vaccine for human papillomavirus (HPV).  If you had a hysterectomy for a problem that was not cancer or a condition that could lead to cancer, then you no longer need Pap tests. Even if you no longer need a Pap test, a regular exam is a good idea to make sure no other problems are starting.  If you are between ages 43 and 62, and you have had normal Pap tests going back 10 years, you no longer need Pap tests. Even if you no longer need a Pap test, a regular exam is a good idea to make sure no other problems are starting.  If you have had past treatment for cervical cancer or a condition that could lead to cancer, you need Pap tests and screening for cancer for at least 20 years after your treatment.  If Pap tests  have been discontinued, risk factors (such as a new sexual partner) need to be reassessed to determine if screening should be resumed.  The HPV test is an additional test that may be used for cervical cancer screening. The HPV test looks for the virus that can cause the cell changes on the cervix. The cells collected during the Pap test can be tested for HPV. The HPV test could be used to screen women aged 81 years and older, and should be used in women of any age who have unclear Pap test results. After the age of 64, women should have HPV testing at the same frequency as a Pap test.  Colorectal cancer can be detected and often prevented. Most routine colorectal cancer screening begins at the age of 66 and continues through age 58. However, your caregiver may recommend screening at an earlier age if you have risk factors for colon cancer. On a yearly basis, your caregiver may provide home test kits to check for hidden blood in the stool. Use of a small camera at the end of a tube, to directly examine the colon (sigmoidoscopy or colonoscopy), can detect the earliest forms of colorectal cancer. Talk to your  caregiver about this at age 27, when routine screening begins. Direct examination of the colon should be repeated every 5 to 10 years through age 74, unless early forms of pre-cancerous polyps or small growths are found.  Hepatitis C blood testing is recommended for all people born from [birth year] through 1965 and any individual with known risks for hepatitis C.  Practice safe sex. Use condoms and avoid high-risk sexual practices to reduce the spread of sexually transmitted infections (STIs). STIs include gonorrhea, chlamydia, syphilis, trichomonas, herpes, HPV, and human immunodeficiency virus (HIV). Herpes, HIV, and HPV are viral illnesses that have no cure. They can result in disability, cancer, and death. Sexually active women aged 47 and younger should be checked for chlamydia. Older women with new or multiple partners should also be tested for chlamydia. Testing for other STIs is recommended if you are sexually active and at increased risk.  Osteoporosis is a disease in which the bones lose minerals and strength with aging. This can result in serious bone fractures. The risk of osteoporosis can be identified using a bone density scan. Women ages 56 and over and women at risk for fractures or osteoporosis should discuss screening with their caregivers. Ask your caregiver whether you should take a calcium supplement or vitamin D to reduce the rate of osteoporosis.  Menopause can be associated with physical symptoms and risks. Hormone replacement therapy is available to decrease symptoms and risks. You should talk to your caregiver about whether hormone replacement therapy is right for you.  Use sunscreen with sun protection factor (SPF) of 30 or more. Apply sunscreen liberally and repeatedly throughout the day. You should seek shade when your shadow is shorter than you. Protect yourself by wearing long sleeves, pants, a wide-brimmed hat, and sunglasses year round, whenever you are outdoors.  Once a month,  do a whole body skin exam, using a mirror to look at the skin on your back. Notify your caregiver of new moles, moles that have irregular borders, moles that are larger than a pencil eraser, or moles that have changed in shape or color.  Stay current with required immunizations.  Influenza. You need a dose every fall (or winter). The composition of the flu vaccine changes each year, so being vaccinated once is not enough.  Pneumococcal polysaccharide.  You need 1 to 2 doses if you smoke cigarettes or if you have certain chronic medical conditions. You need 1 dose at age 1 (or older) if you have never been vaccinated.  Tetanus, diphtheria, pertussis (Tdap, Td). Get 1 dose of Tdap vaccine if you are younger than age 87, are over 36 and have contact with an infant, are a Research scientist (physical sciences), are pregnant, or simply want to be protected from whooping cough. After that, you need a Td booster dose every 10 years. Consult your caregiver if you have not had at least 3 tetanus and diphtheria-containing shots sometime in your life or have a deep or dirty wound.  HPV. You need this vaccine if you are a woman age 58 or younger. The vaccine is given in 3 doses over 6 months.  Measles, mumps, rubella (MMR). You need at least 1 dose of MMR if you were born in 1957 or later. You may also need a second dose.  Meningococcal. If you are age 57 to 18 and a first-year college student living in a residence hall, or have one of several medical conditions, you need to get vaccinated against meningococcal disease. You may also need additional booster doses.  Zoster (shingles). If you are age 4 or older, you should get this vaccine.  Varicella (chickenpox). If you have never had chickenpox or you were vaccinated but received only 1 dose, talk to your caregiver to find out if you need this vaccine.  Hepatitis A. You need this vaccine if you have a specific risk factor for hepatitis A virus infection or you simply wish to be  protected from this disease. The vaccine is usually given as 2 doses, 6 to 18 months apart.  Hepatitis B. You need this vaccine if you have a specific risk factor for hepatitis B virus infection or you simply wish to be protected from this disease. The vaccine is given in 3 doses, usually over 6 months. Preventive Services / Frequency Ages 38 to 24  Blood pressure check.** / Every 1 to 2 years.  Lipid and cholesterol check.** / Every 5 years beginning at age 59.  Clinical breast exam.** / Every year after age 26.  Mammogram.** / Every year beginning at age 64 and continuing for as long as you are in good health. Consult with your caregiver.  Pap test.** / Every 3 years starting at age 94 through age 26 or 21 with a history of 3 consecutive normal Pap tests.  HPV screening.** / Every 3 years from ages 26 through ages 27 to 78 with a history of 3 consecutive normal Pap tests.  Fecal occult blood test (FOBT) of stool. / Every year beginning at age 76 and continuing until age 79. You may not need to do this test if you get a colonoscopy every 10 years.  Flexible sigmoidoscopy or colonoscopy.** / Every 5 years for a flexible sigmoidoscopy or every 10 years for a colonoscopy beginning at age 25 and continuing until age 87.  Hepatitis C blood test.** / For all people born from [birth year] through 1965 and any individual with known risks for hepatitis C.  Skin self-exam. / Monthly.  Influenza immunization.** / Every year.  Pneumococcal polysaccharide immunization.** / 1 to 2 doses if you smoke cigarettes or if you have certain chronic medical conditions.  Tetanus, diphtheria, pertussis (Tdap, Td) immunization.** / A one-time dose of Tdap vaccine. After that, you need a Td booster dose every 10 years.  Measles, mumps, rubella (MMR) immunization. /  You need at least 1 dose of MMR if you were born in 1957 or later. You may also need a second dose.  Varicella immunization.** / Consult your  caregiver.  Meningococcal immunization.** / Consult your caregiver.  Hepatitis A immunization.** / Consult your caregiver. 2 doses, 6 to 18 months apart.  Hepatitis B immunization.** / Consult your caregiver. 3 doses, usually over 6 months. Ages 61 and over  Blood pressure check.** / Every 1 to 2 years.  Lipid and cholesterol check.** / Every 5 years beginning at age 57.  Clinical breast exam.** / Every year after age 62.  Mammogram.** / Every year beginning at age 77 and continuing for as long as you are in good health. Consult with your caregiver.  Pap test.** / Every 3 years starting at age 6 through age 53 or 61 with a 3 consecutive normal Pap tests. Testing can be stopped between 65 and 70 with 3 consecutive normal Pap tests and no abnormal Pap or HPV tests in the past 10 years.  HPV screening.** / Every 3 years from ages 26 through ages 64 or 27 with a history of 3 consecutive normal Pap tests. Testing can be stopped between 65 and 70 with 3 consecutive normal Pap tests and no abnormal Pap or HPV tests in the past 10 years.  Fecal occult blood test (FOBT) of stool. / Every year beginning at age 82 and continuing until age 36. You may not need to do this test if you get a colonoscopy every 10 years.  Flexible sigmoidoscopy or colonoscopy.** / Every 5 years for a flexible sigmoidoscopy or every 10 years for a colonoscopy beginning at age 15 and continuing until age 65.  Hepatitis C blood test.** / For all people born from [birth year] through 1965 and any individual with known risks for hepatitis C.  Osteoporosis screening.** / A one-time screening for women ages 54 and over and women at risk for fractures or osteoporosis.  Skin self-exam. / Monthly.  Influenza immunization.** / Every year.  Pneumococcal polysaccharide immunization.** / 1 dose at age 44 (or older) if you have never been vaccinated.  Tetanus, diphtheria, pertussis (Tdap, Td) immunization. / A one-time dose of Tdap  vaccine if you are over 65 and have contact with an infant, are a Research scientist (physical sciences), or simply want to be protected from whooping cough. After that, you need a Td booster dose every 10 years.  Varicella immunization.** / Consult your caregiver.  Meningococcal immunization.** / Consult your caregiver.  Hepatitis A immunization.** / Consult your caregiver. 2 doses, 6 to 18 months apart.  Hepatitis B immunization.** / Check with your caregiver. 3 doses, usually over 6 months. ** Family history and personal history of risk and conditions may change your caregiver's recommendations. Document Released: 12/10/2001 Document Revised: 01/06/2012 Document Reviewed: 03/11/2011 [facility] Patient Information 2013 [place], [place].   How to Take Your Blood Pressure  These instructions are only for electronic home blood pressure machines. You will need:   An automatic or semi-automatic blood pressure machine.  Fresh batteries for the blood pressure machine. HOW DO I USE THESE TOOLS TO CHECK MY BLOOD PRESSURE?   There are 2 numbers that make up your blood pressure. For example: 120/80.  The first number (120 in our example) is called the "systolic pressure." It is a measure of the pressure in your blood vessels when your heart is pumping blood.  The second number (80 in our example) is called the "diastolic pressure." It is a  measure of the pressure in your blood vessels when your heart is resting between beats.  Before you buy a home blood pressure machine, check the size of your arm so you can buy the right size cuff. Here is how to check the size of your arm:  Use a tape measure that shows both inches and centimeters.  Wrap the tape measure around the middle upper part of your arm. You may need someone to help you measure right.  Write down your arm measurement in both inches and centimeters.  To measure your blood pressure right, it is important to have the right size cuff.  If your arm is up  to 13 inches (37 to 34 centimeters), get an adult cuff size.  If your arm is 13 to 17 inches (35 to 44 centimeters), get a large adult cuff size.  If your arm is 17 to 20 inches (45 to 52 centimeters), get an adult thigh cuff.  Try to rest or relax for at least 30 minutes before you check your blood pressure.  Do not smoke.  Do not have any drinks with caffeine, such as:  Pop.  Coffee.  Tea.  Check your blood pressure in a quiet room.  Sit down and stretch out your arm on a table. Keep your arm at about the level of your heart. Let your arm relax. GETTING BLOOD PRESSURE READINGS  Make sure you remove any tight-fighting clothing from your arm. Wrap the cuff around your upper arm. Wrap it just above the bend, and above where you felt the pulse. You should be able to slip a finger between the cuff and your arm. If you cannot slip a finger in the cuff, it is too tight and should be removed and rewrapped.  Some units requires you to manually pump up the arm cuff.  Automatic units inflate the cuff when you press a button.  Cuff deflation is automatic in both models.  After the cuff is inflated, the unit measures your blood pressure and pulse. The readings are displayed on a monitor. Hold still and breathe normally while the cuff is inflated.  Getting a reading takes less than a minute.  Some models store readings in a memory. Some provide a printout of readings.  Get readings at different times of the day. You should wait at least 5 minutes between readings. Take readings with you to your next doctor's visit. Document Released: 09/26/2008 Document Revised: 01/06/2012 Document Reviewed: 09/26/2008 [facility] Patient Information 2013 [place], [place].  Insomnia Insomnia is frequent trouble falling and/or staying asleep. Insomnia can be a long term problem or a short term problem. Both are common. Insomnia can be a short term problem when the wakefulness is related to a certain stress  or worry. Long term insomnia is often related to ongoing stress during waking hours and/or poor sleeping habits. Overtime, sleep deprivation itself can make the problem worse. Every little thing feels more severe because you are overtired and your ability to cope is decreased. CAUSES   Stress, anxiety, and depression.  Poor sleeping habits.  Distractions such as TV in the bedroom.  Naps close to bedtime.  Engaging in emotionally charged conversations before bed.  Technical reading before sleep.  Alcohol and other sedatives. They may make the problem worse. They can hurt normal sleep patterns and normal dream activity.  Stimulants such as caffeine for several hours prior to bedtime.  Pain syndromes and shortness of breath can cause insomnia.  Exercise late at night.  Changing time zones may cause sleeping problems (jet lag). It is sometimes helpful to have someone observe your sleeping patterns. They should look for periods of not breathing during the night (sleep apnea). They should also look to see how long those periods last. If you live alone or observers are uncertain, you can also be observed at a sleep clinic where your sleep patterns will be professionally monitored. Sleep apnea requires a checkup and treatment. Give your caregivers your medical history. Give your caregivers observations your family has made about your sleep.  SYMPTOMS   Not feeling rested in the morning.  Anxiety and restlessness at bedtime.  Difficulty falling and staying asleep. TREATMENT   Your caregiver may prescribe treatment for an underlying medical disorders. Your caregiver can give advice or help if you are using alcohol or other drugs for self-medication. Treatment of underlying problems will usually eliminate insomnia problems.  Medications can be prescribed for short time use. They are generally not recommended for lengthy use.  Over-the-counter sleep medicines are not recommended for lengthy  use. They can be habit forming.  You can promote easier sleeping by making lifestyle changes such as:  Using relaxation techniques that help with breathing and reduce muscle tension.  Exercising earlier in the day.  Changing your diet and the time of your last meal. No night time snacks.  Establish a regular time to go to bed.  Counseling can help with stressful problems and worry.  Soothing music and white noise may be helpful if there are background noises you cannot remove.  Stop tedious detailed work at least one hour before bedtime. HOME CARE INSTRUCTIONS   Keep a diary. Inform your caregiver about your progress. This includes any medication side effects. See your caregiver regularly. Take note of:  Times when you are asleep.  Times when you are awake during the night.  The quality of your sleep.  How you feel the next day. This information will help your caregiver care for you.  Get out of bed if you are still awake after 15 minutes. Read or do some quiet activity. Keep the lights down. Wait until you feel sleepy and go back to bed.  Keep regular sleeping and waking hours. Avoid naps.  Exercise regularly.  Avoid distractions at bedtime. Distractions include watching television or engaging in any intense or detailed activity like attempting to balance the household checkbook.  Develop a bedtime ritual. Keep a familiar routine of bathing, brushing your teeth, climbing into bed at the same time each night, listening to soothing music. Routines increase the success of falling to sleep faster.  Use relaxation techniques. This can be using breathing and muscle tension release routines. It can also include visualizing peaceful scenes. You can also help control troubling or intruding thoughts by keeping your mind occupied with boring or repetitive thoughts like the old concept of counting sheep. You can make it more creative like imagining planting one beautiful flower after  another in your backyard garden.  During your day, work to eliminate stress. When this is not possible use some of the previous suggestions to help reduce the anxiety that accompanies stressful situations. MAKE SURE YOU:   Understand these instructions.  Will watch your condition.  Will get help right away if you are not doing well or get worse. Document Released: 10/11/2000 Document Revised: 01/06/2012 Document Reviewed: 11/11/2007 [facility] Patient Information 2013 [place], [place].

## 2012-09-18 NOTE — Progress Notes (Signed)
Chief Complaint  Patient presents with  . Annual Exam  . Insomnia    HPI: Patient comes in today for Preventive Health Care visit  No major change in health status since last visit . Went to [place] had ear congestion taking sudafed.  Using abien every night and feels dependent.  ? About est vag cream  risk  Has lost weight not as much exercise as in past.  ROS:  GEN/ HEENT: No fever, significant weight changes sweats headaches vision problems; hearing  Decrease after pane flightchanges, CV/ PULM; No chest pain shortness of breath cough, syncope,edema  change in exercise tolerance. GI /GU: No adominal pain, vomiting, change in bowel habits. No blood in the stool. No significant GU symptoms. SKIN/HEME: ,no acute skin rashes suspicious lesions or bleeding. No lymphadenopathy, nodules, masses.  NEURO/ PSYCH:  No neurologic signs such as weakness numbness. No depression anxiety. IMM/ Allergy: No unusual infections.  Allergy .   REST of 12 system review negative except as per HPI   Past Medical History  Diagnosis Date  . Hx of varicella     as a child  . Decreased hearing   . H/O cholesteatoma     surgery [facility]    Family History  Problem Relation Age of Onset  . Lung cancer Mother   . Thyroid disease Daughter     [place]    History   Social History  . Marital Status: Married    Spouse Name: N/A    Number of Children: N/A  . Years of Education: N/A   Social History Main Topics  . Smoking status: Never Smoker   . Smokeless tobacco: Never Used  . Alcohol Use: 4.8 oz/week    8 Glasses of wine per week  . Drug Use: No  . Sexually Active: None   Other Topics Concern  . None   Social History Narrative   hh of 2 No tobacco  Few wine per weekend. Pet dog No ets. RetiredUsed to sleep 6-8 hours pmHelp take care of grandchild born in [place] on and offExercise walking.      Outpatient Prescriptions Prior to Visit  Medication Sig Dispense Refill  . Ibuprofen 200 MG CAPS Take 1  capsule by mouth as needed.        . zolpidem (AMBIEN) 10 MG tablet Take 1 tablet (10 mg total) by mouth at bedtime as needed. sleep  30 tablet  5  . conjugated estrogens (PREMARIN) vaginal cream Place vaginally daily.  42.5 g  3     EXAM:  BP 150/82  Pulse 77  Temp 97.9 F (36.6 C) (Oral)  Ht 5' 2.75" (1.594 m)  Wt 149 lb (67.586 kg)  BMI 26.60 kg/m2  SpO2 98%  Body mass index is 26.60 kg/(m^2). Left 144/80 left  Physical Exam: Vital signs reviewed ZOX:WRUE is a well-developed well-nourished alert cooperative   female who appears her stated age in no acute distress.  HEENT: normocephalic atraumatic , Eyes: PERRL EOM's full, conjunctiva clear, Nares: paten,t no deformity discharge or tenderness., Ears: no deformity EAC's clear TMs with abnormal lm some clear wetness in right. Mouth: clear OP, no lesions, edema.  Moist mucous membranes. Dentition in adequate repair. NECK: supple without masses, thyromegaly or bruits. CHEST/PULM:  Clear to auscultation and percussion breath sounds equal no wheeze , rales or rhonchi. No chest wall deformities or tenderness. Breast: normal by inspection . No dimpling, discharge, masses, tenderness or discharge . CV: PMI is nondisplaced, S1 S2 no gallops,  murmurs, rubs. Peripheral pulses are full without delay.No JVD .  ABDOMEN: Bowel sounds normal nontender  No guard or rebound, no hepato splenomegal no CVA tenderness.  No hernia. Extremtities:  No clubbing cyanosis or edema, no acute joint swelling or redness no focal atrophy NEURO:  Oriented x3, cranial nerves 3-12 appear to be intact, no obvious focal weakness,gait within normal limits no abnormal reflexes or asymmetrical SKIN: No acute rashes normal turgor, color, no bruising or petechiae. PSYCH: Oriented, good eye contact, no obvious depression anxiety, cognition and judgment appear normal. LN: no cervical axillary inguinal adenopathy Pelvic: NL ext GU, labia clear without lesions or rash . Vagina  no lesions .Cervix: clear  UTERUS: Neg CMT Adnexa:  clear no masses . PAP done Rectal negative  mass  Lab Results  Component Value Date   WBC 4.5 09/11/2012   HGB 13.1 09/11/2012   HCT 39.4 09/11/2012   PLT 262.0 09/11/2012   GLUCOSE 100* 09/11/2012   CHOL 250* 09/11/2012   TRIG 145.0 09/11/2012   HDL 74.40 09/11/2012   LDLDIRECT 151.2 09/11/2012   ALT 19 09/11/2012   AST 22 09/11/2012   NA 141 09/11/2012   K 4.9 09/11/2012   CL 107 09/11/2012   CREATININE 0.9 09/11/2012   BUN 19 09/11/2012   CO2 29 09/11/2012   TSH 2.07 09/11/2012    ASSESSMENT AND PLAN:  Discussed the following assessment and plan:  1. Visit for preventive health examination  PAP [[place]]  2. VAGINITIS, ATROPHIC, POSTMENOPAUSAL     risk benefit less is best use but not very high risk   3. HYPERLIPIDEMIA     lsi  4. DECREASED HEARING     [facility] not getting better tm not normal   5. 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 Routine gynecological examination  PAP [[place]]  7. Elevated blood pressure reading     coming down monitor and if elevated persistent will treat  8. H/O cholesteatoma     Preventive Health Care Counseled regarding healthy nutrition, exercise, sleep, injury prevention, calcium vit d and healthy weight . zostavax /pap/ flu/had this care teams   Patient Instructions  Continue lifestyle intervention healthy eating and exercise . Intensify to help cholesterol. decrease the ambien to 5 mg    And then switch over the trazadone  May take a few weeks to see if can help.   Can try  Change estrogen topical  To estradiol and use least amount needed.    Get mammogram.   Get bp readings and if  Elevated  3 -5 separate times  FU with [place]. 140/90 is elevated .  Preventive Care for Adults, Female A healthy lifestyle and preventive care can promote health and wellness. Preventive  health guidelines for women include the following key practices.  A routine yearly physical is a good way to check with your caregiver about your health and preventive screening. It is a chance to share any concerns and updates on your health, and to receive a thorough exam.  Visit your dentist for a routine exam and preventive care every 6 months. Brush your teeth twice a day and floss once a day. Good oral hygiene prevents tooth decay and gum disease.  The frequency of eye exams is based on your age, health, family medical history, use of contact lenses, and other factors. Follow your caregiver's recommendations for frequency  of eye exams.  Eat a healthy diet. Foods like vegetables, fruits, whole grains, low-fat dairy products, and lean protein foods contain the nutrients you need without too many calories. Decrease your intake of foods high in solid fats, added sugars, and salt. Eat the right amount of calories for you.Get information about a proper diet from your caregiver, if necessary.  Regular physical exercise is one of the most important things you can do for your health. Most adults should get at least 150 minutes of moderate-intensity exercise (any activity that increases your heart rate and causes you to sweat) each week. In addition, most adults need muscle-strengthening exercises on 2 or more days a week.  Maintain a healthy weight. The body mass index (BMI) is a screening tool to identify possible weight problems. It provides an estimate of body fat based on height and weight. Your caregiver can help determine your BMI, and can help you achieve or maintain a healthy weight.For adults 20 years and older:  A BMI below 18.5 is considered underweight.  A BMI of 18.5 to 24.9 is normal.  A BMI of 25 to 29.9 is considered overweight.  A BMI of 30 and above is considered obese.  Maintain normal blood lipids and cholesterol levels by exercising and minimizing your intake of saturated  fat. Eat a balanced diet with plenty of fruit and vegetables. Blood tests for lipids and cholesterol should begin at age 73 and be repeated every 5 years. If your lipid or cholesterol levels are high, you are over 50, or you are at high risk for heart disease, you may need your cholesterol levels checked more frequently.Ongoing high lipid and cholesterol levels should be treated with medicines if diet and exercise are not effective.  If you smoke, find out from your caregiver how to quit. If you do not use tobacco, do not start.  If you are pregnant, do not drink alcohol. If you are breastfeeding, be very cautious about drinking alcohol. If you are not pregnant and choose to drink alcohol, do not exceed 1 drink per day. One drink is considered to be 12 ounces (355 mL) of beer, 5 ounces (148 mL) of wine, or 1.5 ounces (44 mL) of liquor.  Avoid use of street drugs. Do not share needles with anyone. Ask for help if you need support or instructions about stopping the use of drugs.  High blood pressure causes heart disease and increases the risk of stroke. Your blood pressure should be checked at least every 1 to 2 years. Ongoing high blood pressure should be treated with medicines if weight loss and exercise are not effective.  If you are 27 to 61 years old, ask your caregiver if you should take aspirin to prevent strokes.  Diabetes screening involves taking a blood sample to check your fasting blood sugar level. This should be done once every 3 years, after age 25, if you are within normal weight and without risk factors for diabetes. Testing should be considered at a younger age or be carried out more frequently if you are overweight and have at least 1 risk factor for diabetes.  Breast cancer screening is essential preventive care for women. You should practice "breast self-awareness." This means understanding the normal appearance and feel of your breasts and may include breast self-examination. Any  changes detected, no matter how small, should be reported to a caregiver. Women in their 35s and 30s should have a clinical breast exam (CBE) by a caregiver as part of  a regular health exam every 1 to 3 years. After age 68, women should have a CBE every year. Starting at age 37, women should consider having a mammography (breast X-ray test) every year. Women who have a family history of breast cancer should talk to their caregiver about genetic screening. Women at a high risk of breast cancer should talk to their caregivers about having magnetic resonance imaging (MRI) and a mammography every year.  The Pap test is a screening test for cervical cancer. A Pap test can show cell changes on the cervix that might become cervical cancer if left untreated. A Pap test is a procedure in which cells are obtained and examined from the lower end of the uterus (cervix).  Women should have a Pap test starting at age 28.  Between ages 43 and 18, Pap tests should be repeated every 2 years.  Beginning at age 82, you should have a Pap test every 3 years as long as the past 3 Pap tests have been normal.  Some women have medical problems that increase the chance of getting cervical cancer. Talk to your caregiver about these problems. It is especially important to talk to your caregiver if a new problem develops soon after your last Pap test. In these cases, your caregiver may recommend more frequent screening and Pap tests.  The above recommendations are the same for women who have or have not gotten the vaccine for human papillomavirus (HPV).  If you had a hysterectomy for a problem that was not cancer or a condition that could lead to cancer, then you no longer need Pap tests. Even if you no longer need a Pap test, a regular exam is a good idea to make sure no other problems are starting.  If you are between ages 62 and 78, and you have had normal Pap tests going back 10 years, you no longer need Pap tests. Even if  you no longer need a Pap test, a regular exam is a good idea to make sure no other problems are starting.  If you have had past treatment for cervical cancer or a condition that could lead to cancer, you need Pap tests and screening for cancer for at least 20 years after your treatment.  If Pap tests have been discontinued, risk factors (such as a new sexual partner) need to be reassessed to determine if screening should be resumed.  The HPV test is an additional test that may be used for cervical cancer screening. The HPV test looks for the virus that can cause the cell changes on the cervix. The cells collected during the Pap test can be tested for HPV. The HPV test could be used to screen women aged 21 years and older, and should be used in women of any age who have unclear Pap test results. After the age of 81, women should have HPV testing at the same frequency as a Pap test.  Colorectal cancer can be detected and often prevented. Most routine colorectal cancer screening begins at the age of 55 and continues through age 71. However, your caregiver may recommend screening at an earlier age if you have risk factors for colon cancer. On a yearly basis, your caregiver may provide home test kits to check for hidden blood in the stool. Use of a small camera at the end of a tube, to directly examine the colon (sigmoidoscopy or colonoscopy), can detect the earliest forms of colorectal cancer. Talk to your caregiver about this at  age 26, when routine screening begins. Direct examination of the colon should be repeated every 5 to 10 years through age 48, unless early forms of pre-cancerous polyps or small growths are found.  Hepatitis C blood testing is recommended for all people born from [birth year] through 1965 and any individual with known risks for hepatitis C.  Practice safe sex. Use condoms and avoid high-risk sexual practices to reduce the spread of sexually transmitted infections (STIs). STIs include  gonorrhea, chlamydia, syphilis, trichomonas, herpes, HPV, and human immunodeficiency virus (HIV). Herpes, HIV, and HPV are viral illnesses that have no cure. They can result in disability, cancer, and death. Sexually active women aged 64 and younger should be checked for chlamydia. Older women with new or multiple partners should also be tested for chlamydia. Testing for other STIs is recommended if you are sexually active and at increased risk.  Osteoporosis is a disease in which the bones lose minerals and strength with aging. This can result in serious bone fractures. The risk of osteoporosis can be identified using a bone density scan. Women ages 10 and over and women at risk for fractures or osteoporosis should discuss screening with their caregivers. Ask your caregiver whether you should take a calcium supplement or vitamin D to reduce the rate of osteoporosis.  Menopause can be associated with physical symptoms and risks. Hormone replacement therapy is available to decrease symptoms and risks. You should talk to your caregiver about whether hormone replacement therapy is right for you.  Use sunscreen with sun protection factor (SPF) of 30 or more. Apply sunscreen liberally and repeatedly throughout the day. You should seek shade when your shadow is shorter than you. Protect yourself by wearing long sleeves, pants, a wide-brimmed hat, and sunglasses year round, whenever you are outdoors.  Once a month, do a whole body skin exam, using a mirror to look at the skin on your back. Notify your caregiver of new moles, moles that have irregular borders, moles that are larger than a pencil eraser, or moles that have changed in shape or color.  Stay current with required immunizations.  Influenza. You need a dose every fall (or winter). The composition of the flu vaccine changes each year, so being vaccinated once is not enough.  Pneumococcal polysaccharide. You need 1 to 2 doses if you smoke cigarettes  or if you have certain chronic medical conditions. You need 1 dose at age 82 (or older) if you have never been vaccinated.  Tetanus, diphtheria, pertussis (Tdap, Td). Get 1 dose of Tdap vaccine if you are younger than age 88, are over 42 and have contact with an infant, are a Research scientist (physical sciences), are pregnant, or simply want to be protected from whooping cough. After that, you need a Td booster dose every 10 years. Consult your caregiver if you have not had at least 3 tetanus and diphtheria-containing shots sometime in your life or have a deep or dirty wound.  HPV. You need this vaccine if you are a woman age 41 or younger. The vaccine is given in 3 doses over 6 months.  Measles, mumps, rubella (MMR). You need at least 1 dose of MMR if you were born in 1957 or later. You may also need a second dose.  Meningococcal. If you are age 83 to 67 and a first-year college student living in a residence hall, or have one of several medical conditions, you need to get vaccinated against meningococcal disease. You may also need additional booster doses.  Zoster (  shingles). If you are age 102 or older, you should get this vaccine.  Varicella (chickenpox). If you have never had chickenpox or you were vaccinated but received only 1 dose, talk to your caregiver to find out if you need this vaccine.  Hepatitis A. You need this vaccine if you have a specific risk factor for hepatitis A virus infection or you simply wish to be protected from this disease. The vaccine is usually given as 2 doses, 6 to 18 months apart.  Hepatitis B. You need this vaccine if you have a specific risk factor for hepatitis B virus infection or you simply wish to be protected from this disease. The vaccine is given in 3 doses, usually over 6 months. Preventive Services / Frequency Ages 68 to 14  Blood pressure check.** / Every 1 to 2 years.  Lipid and cholesterol check.** / Every 5 years beginning at age 60.  Clinical breast exam.** /  Every year after age 34.  Mammogram.** / Every year beginning at age 63 and continuing for as long as you are in good health. Consult with your caregiver.  Pap test.** / Every 3 years starting at age 36 through age 21 or 81 with a history of 3 consecutive normal Pap tests.  HPV screening.** / Every 3 years from ages 67 through ages 78 to 32 with a history of 3 consecutive normal Pap tests.  Fecal occult blood test (FOBT) of stool. / Every year beginning at age 26 and continuing until age 40. You may not need to do this test if you get a colonoscopy every 10 years.  Flexible sigmoidoscopy or colonoscopy.** / Every 5 years for a flexible sigmoidoscopy or every 10 years for a colonoscopy beginning at age 51 and continuing until age 24.  Hepatitis C blood test.** / For all people born from [birth year] through 1965 and any individual with known risks for hepatitis C.  Skin self-exam. / Monthly.  Influenza immunization.** / Every year.  Pneumococcal polysaccharide immunization.** / 1 to 2 doses if you smoke cigarettes or if you have certain chronic medical conditions.  Tetanus, diphtheria, pertussis (Tdap, Td) immunization.** / A one-time dose of Tdap vaccine. After that, you need a Td booster dose every 10 years.  Measles, mumps, rubella (MMR) immunization. / You need at least 1 dose of MMR if you were born in 1957 or later. You may also need a second dose.  Varicella immunization.** / Consult your caregiver.  Meningococcal immunization.** / Consult your caregiver.  Hepatitis A immunization.** / Consult your caregiver. 2 doses, 6 to 18 months apart.  Hepatitis B immunization.** / Consult your caregiver. 3 doses, usually over 6 months. Ages 69 and over  Blood pressure check.** / Every 1 to 2 years.  Lipid and cholesterol check.** / Every 5 years beginning at age 3.  Clinical breast exam.** / Every year after age 65.  Mammogram.** / Every year beginning at age 66 and continuing for as long  as you are in good health. Consult with your caregiver.  Pap test.** / Every 3 years starting at age 27 through age 9 or 31 with a 3 consecutive normal Pap tests. Testing can be stopped between 65 and 70 with 3 consecutive normal Pap tests and no abnormal Pap or HPV tests in the past 10 years.  HPV screening.** / Every 3 years from ages 51 through ages 67 or 75 with a history of 3 consecutive normal Pap tests. Testing can be stopped between 65 and  70 with 3 consecutive normal Pap tests and no abnormal Pap or HPV tests in the past 10 years.  Fecal occult blood test (FOBT) of stool. / Every year beginning at age 24 and continuing until age 82. You may not need to do this test if you get a colonoscopy every 10 years.  Flexible sigmoidoscopy or colonoscopy.** / Every 5 years for a flexible sigmoidoscopy or every 10 years for a colonoscopy beginning at age 71 and continuing until age 37.  Hepatitis C blood test.** / For all people born from [birth year] through 1965 and any individual with known risks for hepatitis C.  Osteoporosis screening.** / A one-time screening for women ages 23 and over and women at risk for fractures or osteoporosis.  Skin self-exam. / Monthly.  Influenza immunization.** / Every year.  Pneumococcal polysaccharide immunization.** / 1 dose at age 32 (or older) if you have never been vaccinated.  Tetanus, diphtheria, pertussis (Tdap, Td) immunization. / A one-time dose of Tdap vaccine if you are over 65 and have contact with an infant, are a Research scientist (physical sciences), or simply want to be protected from whooping cough. After that, you need a Td booster dose every 10 years.  Varicella immunization.** / Consult your caregiver.  Meningococcal immunization.** / Consult your caregiver.  Hepatitis A immunization.** / Consult your caregiver. 2 doses, 6 to 18 months apart.  Hepatitis B immunization.** / Check with your caregiver. 3 doses, usually over 6 months. ** Family history and personal  history of risk and conditions may change your caregiver's recommendations. Document Released: 12/10/2001 Document Revised: 01/06/2012 Document Reviewed: 03/11/2011 [facility] Patient Information 2013 [place], [place].   How to Take Your Blood Pressure  These instructions are only for electronic home blood pressure machines. You will need:   An automatic or semi-automatic blood pressure machine.  Fresh batteries for the blood pressure machine. HOW DO I USE THESE TOOLS TO CHECK MY BLOOD PRESSURE?   There are 2 numbers that make up your blood pressure. For example: 120/80.  The first number (120 in our example) is called the "systolic pressure." It is a measure of the pressure in your blood vessels when your heart is pumping blood.  The second number (80 in our example) is called the "diastolic pressure." It is a measure of the pressure in your blood vessels when your heart is resting between beats.  Before you buy a home blood pressure machine, check the size of your arm so you can buy the right size cuff. Here is how to check the size of your arm:  Use a tape measure that shows both inches and centimeters.  Wrap the tape measure around the middle upper part of your arm. You may need someone to help you measure right.  Write down your arm measurement in both inches and centimeters.  To measure your blood pressure right, it is important to have the right size cuff.  If your arm is up to 13 inches (37 to 34 centimeters), get an adult cuff size.  If your arm is 13 to 17 inches (35 to 44 centimeters), get a large adult cuff size.  If your arm is 17 to 20 inches (45 to 52 centimeters), get an adult thigh cuff.  Try to rest or relax for at least 30 minutes before you check your blood pressure.  Do not smoke.  Do not have any drinks with caffeine, such as:  Pop.  Coffee.  Tea.  Check your blood pressure in a quiet  room.  Sit down and stretch out your arm on a table. Keep your  arm at about the level of your heart. Let your arm relax. GETTING BLOOD PRESSURE READINGS  Make sure you remove any tight-fighting clothing from your arm. Wrap the cuff around your upper arm. Wrap it just above the bend, and above where you felt the pulse. You should be able to slip a finger between the cuff and your arm. If you cannot slip a finger in the cuff, it is too tight and should be removed and rewrapped.  Some units requires you to manually pump up the arm cuff.  Automatic units inflate the cuff when you press a button.  Cuff deflation is automatic in both models.  After the cuff is inflated, the unit measures your blood pressure and pulse. The readings are displayed on a monitor. Hold still and breathe normally while the cuff is inflated.  Getting a reading takes less than a minute.  Some models store readings in a memory. Some provide a printout of readings.  Get readings at different times of the day. You should wait at least 5 minutes between readings. Take readings with you to your next doctor's visit. Document Released: 09/26/2008 Document Revised: 01/06/2012 Document Reviewed: 09/26/2008 [facility] Patient Information 2013 [place], [place].  Insomnia Insomnia is frequent trouble falling and/or staying asleep. Insomnia can be a long term problem or a short term problem. Both are common. Insomnia can be a short term problem when the wakefulness is related to a certain stress or worry. Long term insomnia is often related to ongoing stress during waking hours and/or poor sleeping habits. Overtime, sleep deprivation itself can make the problem worse. Every little thing feels more severe because you are overtired and your ability to cope is decreased. CAUSES   Stress, anxiety, and depression.  Poor sleeping habits.  Distractions such as TV in the bedroom.  Naps close to bedtime.  Engaging in emotionally charged conversations before bed.  Technical reading before  sleep.  Alcohol and other sedatives. They may make the problem worse. They can hurt normal sleep patterns and normal dream activity.  Stimulants such as caffeine for several hours prior to bedtime.  Pain syndromes and shortness of breath can cause insomnia.  Exercise late at night.  Changing time zones may cause sleeping problems (jet lag). It is sometimes helpful to have someone observe your sleeping patterns. They should look for periods of not breathing during the night (sleep apnea). They should also look to see how long those periods last. If you live alone or observers are uncertain, you can also be observed at a sleep clinic where your sleep patterns will be professionally monitored. Sleep apnea requires a checkup and treatment. Give your caregivers your medical history. Give your caregivers observations your family has made about your sleep.  SYMPTOMS   Not feeling rested in the morning.  Anxiety and restlessness at bedtime.  Difficulty falling and staying asleep. TREATMENT   Your caregiver may prescribe treatment for an underlying medical disorders. Your caregiver can give advice or help if you are using alcohol or other drugs for self-medication. Treatment of underlying problems will usually eliminate insomnia problems.  Medications can be prescribed for short time use. They are generally not recommended for lengthy use.  Over-the-counter sleep medicines are not recommended for lengthy use. They can be habit forming.  You can promote easier sleeping by making lifestyle changes such as:  Using relaxation techniques that help with breathing and  reduce muscle tension.  Exercising earlier in the day.  Changing your diet and the time of your last meal. No night time snacks.  Establish a regular time to go to bed.  Counseling can help with stressful problems and worry.  Soothing music and white noise may be helpful if there are background noises you cannot remove.  Stop  tedious detailed work at least one hour before bedtime. HOME CARE INSTRUCTIONS   Keep a diary. Inform your caregiver about your progress. This includes any medication side effects. See your caregiver regularly. Take note of:  Times when you are asleep.  Times when you are awake during the night.  The quality of your sleep.  How you feel the next day. This information will help your caregiver care for you.  Get out of bed if you are still awake after 15 minutes. Read or do some quiet activity. Keep the lights down. Wait until you feel sleepy and go back to bed.  Keep regular sleeping and waking hours. Avoid naps.  Exercise regularly.  Avoid distractions at bedtime. Distractions include watching television or engaging in any intense or detailed activity like attempting to balance the household checkbook.  Develop a bedtime ritual. Keep a familiar routine of bathing, brushing your teeth, climbing into bed at the same time each night, listening to soothing music. Routines increase the success of falling to sleep faster.  Use relaxation techniques. This can be using breathing and muscle tension release routines. It can also include visualizing peaceful scenes. You can also help control troubling or intruding thoughts by keeping your mind occupied with boring or repetitive thoughts like the old concept of counting sheep. You can make it more creative like imagining planting one beautiful flower after another in your backyard garden.  During your day, work to eliminate stress. When this is not possible use some of the previous suggestions to help reduce the anxiety that accompanies stressful situations. MAKE SURE YOU:   Understand these instructions.  Will watch your condition.  Will get help right away if you are not doing well or get worse. Document Released: 10/11/2000 Document Revised: 01/06/2012 Document Reviewed: 11/11/2007 [facility] Patient Information 2013 [place],  [place].      Neta Mends. Panosh M.D.

## 2012-09-23 NOTE — Progress Notes (Signed)
Quick Note:  Tell patient PAP is normal. Neg high risk HPV ______ 

## 2012-09-25 ENCOUNTER — Encounter: Payer: Self-pay | Admitting: Family Medicine

## 2012-12-17 ENCOUNTER — Other Ambulatory Visit: Payer: Self-pay | Admitting: Internal Medicine

## 2012-12-30 ENCOUNTER — Telehealth: Payer: Self-pay | Admitting: Internal Medicine

## 2012-12-30 NOTE — Telephone Encounter (Signed)
Caller: Tracy Pena/Patient; Phone: [phone]; Reason for Call: Patient c/o vaginal dryness and pain with intercourse despite using premarin cream since November 2013.  States she saw an ad for Osphena, a "non-estrogen oral medication, " and would like Dr.  Fabian Sharp to consider it for her.  Declines new triage; info to office for provider review/callback.  May reach patient at [phone].  Krs/can

## 2012-12-31 NOTE — Telephone Encounter (Signed)
Pt is sch for tomorrow °

## 2012-12-31 NOTE — Telephone Encounter (Signed)
Can you please call the patient and have her come in for an office visit.  She will need to discuss medication options with WP.  Thanks!!

## 2012-12-31 NOTE — Telephone Encounter (Signed)
This is a newer medicine that is oral. OV

## 2013-01-01 ENCOUNTER — Ambulatory Visit: Payer: 59 | Admitting: Internal Medicine

## 2013-01-26 ENCOUNTER — Telehealth: Payer: Self-pay | Admitting: Internal Medicine

## 2013-01-26 MED ORDER — ESTRADIOL 0.1 MG/GM VA CREA: TOPICAL_CREAM | VAGINAL | Status: DC

## 2013-01-26 NOTE — Telephone Encounter (Signed)
Sent to the pharmacy with 1 extra refill.

## 2013-01-26 NOTE — Telephone Encounter (Signed)
Patient states she saw an ad for new Vaginal dryness cream Osphena.  She considered trying it and had even called the office for possibly trying.  Was told she would need an appt.  After doing research she has decided against the Osphena and would like to to continue with Estrace Vaginal Cream.  She wants Dr. Fabian Sharp to know at one time they filled Rx for premarin cream.  She wants to make sure she gets Estrace cream.  Rx to [facility] pharmacy on record.  Patient Number- [phone]

## 2013-03-30 ENCOUNTER — Telehealth: Payer: Self-pay | Admitting: Family Medicine

## 2013-03-30 ENCOUNTER — Other Ambulatory Visit: Payer: Self-pay | Admitting: Internal Medicine

## 2013-03-30 MED ORDER — ZOLPIDEM TARTRATE 10 MG PO TABS: ORAL_TABLET | ORAL | Status: DC

## 2013-03-30 NOTE — Telephone Encounter (Signed)
Called in and left on voicemail.  

## 2013-03-30 NOTE — Telephone Encounter (Signed)
Last Seen: 09/18/12 Last Filled on: 12/17/12 #30 with 2 additional refills No follow up scheduled. Please advise. Thanks!!

## 2013-03-30 NOTE — Telephone Encounter (Signed)
Call in #10 Ambien with no rf

## 2013-04-09 ENCOUNTER — Ambulatory Visit (INDEPENDENT_AMBULATORY_CARE_PROVIDER_SITE_OTHER): Payer: 59 | Admitting: Internal Medicine

## 2013-04-09 ENCOUNTER — Encounter: Payer: Self-pay | Admitting: Internal Medicine

## 2013-04-09 VITALS — BP 136/74 | HR 66 | Temp 97.8°F | Wt 158.0 lb

## 2013-04-09 DIAGNOSIS — Z79899 Other long term (current) drug therapy: Secondary | ICD-10-CM | POA: Insufficient documentation

## 2013-04-09 DIAGNOSIS — G4700 Insomnia, unspecified: Secondary | ICD-10-CM

## 2013-04-09 MED ORDER — ZOLPIDEM TARTRATE 10 MG PO TABS: ORAL_TABLET | ORAL | Status: DC

## 2013-04-09 NOTE — Patient Instructions (Signed)
Continue to try  lsi   Will look into other options.  cpx with labs  6 months and med check then .,

## 2013-04-09 NOTE — Progress Notes (Signed)
Chief Complaint  Patient presents with  . Follow-up    Meds.  Would like to renew her Ambien.    HPI:fu med evaluation No major change in health status since last visit . Bp  ? Ok  Not really checking at this point  Medications:  ambien     Taking 10 .     And not with grand kids.  Doesn't sleep at all without .   friend uses xanax ? Would that be better ROS: See pertinent positives and negatives per HPI.  Past Medical History  Diagnosis Date  . Hx of varicella     as a child  . Decreased hearing   . H/O cholesteatoma     surgery [facility]    Family History  Problem Relation Age of Onset  . Lung cancer Mother   . Thyroid disease Daughter     graves    History   Social History  . Marital Status: Married    Spouse Name: N/A    Number of Children: N/A  . Years of Education: N/A   Social History Main Topics  . Smoking status: Never Smoker   . Smokeless tobacco: Never Used  . Alcohol Use: 4.8 oz/week    8 Glasses of wine per week  . Drug Use: No  . Sexually Active: None   Other Topics Concern  . None   Social History Narrative   hh of 2    No tobacco  Few wine per weekend.    Pet dog    No ets.    Retired   Used to sleep 6-8 hours pm   Help take care of grandchild born in [place] on and off   Exercise walking.      Outpatient Encounter Prescriptions as of 04/09/2013  Medication Sig Dispense Refill  . estradiol (ESTRACE VAGINAL) 0.1 MG/GM vaginal cream Use 2 gram as directed or 3 x per week.  42.5 g  1  . Ibuprofen 200 MG CAPS Take 1 capsule by mouth as needed.        . zolpidem (AMBIEN) 10 MG tablet TAKE 1/2-1 TABLET BY MOUTH AT BEDTIME FOR SLEEP  30 tablet  5  . [DISCONTINUED] traZODone (DESYREL) 50 MG tablet Take 0.5-1.5 tablets (25-75 mg total) by mouth at bedtime as needed for sleep.  45 tablet  3  . [DISCONTINUED] zolpidem (AMBIEN) 10 MG tablet TAKE 1/2-1 TABLET BY MOUTH AT BEDTIME FOR SLEEP  10 tablet  0   No facility-administered encounter medications on file  as of 04/09/2013.    EXAM:  BP 136/74  Pulse 66  Temp(Src) 97.8 F (36.6 C) (Oral)  Wt 158 lb (71.668 kg)  BMI 28.21 kg/m2  SpO2 97%  Body mass index is 28.21 kg/(m^2).  GENERAL: vitals reviewed and listed above, alert, oriented, appears well hydrated and in no acute distress  PSYCH: pleasant and cooperative, no obvious depression or anxiety  ASSESSMENT AND PLAN:  Discussed the following assessment and plan:  SLEEPLESSNESS - pretty much dependent ofn ambien 10 no other se is cautious no driving etc . trazadone no help otherwise doing well. consdier other optinos in future  Risk benefit of medication discussed.  -Patient advised to return or notify health care team  if symptoms worsen or persist or new concerns arise.  Patient Instructions  Continue to try  lsi   Will look into other options.  cpx with labs  6 months and med check then .,     Burna Mortimer  KRegis Bill M.D.

## 2013-09-02 ENCOUNTER — Other Ambulatory Visit: Payer: Self-pay

## 2013-09-10 ENCOUNTER — Encounter: Payer: Self-pay | Admitting: *Deleted

## 2013-09-13 ENCOUNTER — Other Ambulatory Visit (INDEPENDENT_AMBULATORY_CARE_PROVIDER_SITE_OTHER): Payer: 59

## 2013-09-13 DIAGNOSIS — Z0000 Encounter for general adult medical examination without abnormal findings: Secondary | ICD-10-CM

## 2013-09-13 DIAGNOSIS — E785 Hyperlipidemia, unspecified: Secondary | ICD-10-CM

## 2013-09-13 LAB — LDL CHOLESTEROL, DIRECT: Direct LDL: 142.7 mg/dL

## 2013-09-13 LAB — HEPATIC FUNCTION PANEL
Alkaline Phosphatase: 52 U/L (ref 39–117)
Bilirubin, Direct: 0.1 mg/dL (ref 0.0–0.3)
Total Protein: 6.9 g/dL (ref 6.0–8.3)

## 2013-09-13 LAB — CBC WITH DIFFERENTIAL/PLATELET
Basophils Relative: 0.6 % (ref 0.0–3.0)
Eosinophils Absolute: 0.2 10*3/uL (ref 0.0–0.7)
Eosinophils Relative: 3.7 % (ref 0.0–5.0)
Lymphocytes Relative: 29 % (ref 12.0–46.0)
Neutrophils Relative %: 55.2 % (ref 43.0–77.0)
RBC: 4.24 Mil/uL (ref 3.87–5.11)
WBC: 5.4 10*3/uL (ref 4.5–10.5)

## 2013-09-13 LAB — LIPID PANEL
Total CHOL/HDL Ratio: 3
Triglycerides: 79 mg/dL (ref 0.0–149.0)

## 2013-09-13 LAB — BASIC METABOLIC PANEL
Calcium: 9.4 mg/dL (ref 8.4–10.5)
Creatinine, Ser: 0.9 mg/dL (ref 0.4–1.2)
GFR: 64.83 mL/min (ref >60.00)

## 2013-09-20 ENCOUNTER — Encounter: Payer: Self-pay | Admitting: Internal Medicine

## 2013-09-20 ENCOUNTER — Ambulatory Visit (INDEPENDENT_AMBULATORY_CARE_PROVIDER_SITE_OTHER): Payer: 59 | Admitting: Internal Medicine

## 2013-09-20 VITALS — BP 150/86 | HR 77 | Temp 98.0°F | Ht 63.25 in | Wt 163.0 lb

## 2013-09-20 DIAGNOSIS — F411 Generalized anxiety disorder: Secondary | ICD-10-CM | POA: Insufficient documentation

## 2013-09-20 DIAGNOSIS — R030 Elevated blood-pressure reading, without diagnosis of hypertension: Secondary | ICD-10-CM

## 2013-09-20 DIAGNOSIS — Z0000 Encounter for general adult medical examination without abnormal findings: Secondary | ICD-10-CM

## 2013-09-20 DIAGNOSIS — G4700 Insomnia, unspecified: Secondary | ICD-10-CM

## 2013-09-20 DIAGNOSIS — N952 Postmenopausal atrophic vaginitis: Secondary | ICD-10-CM

## 2013-09-20 DIAGNOSIS — H9190 Unspecified hearing loss, unspecified ear: Secondary | ICD-10-CM

## 2013-09-20 MED ORDER — CITALOPRAM HYDROBROMIDE 20 MG PO TABS: ORAL_TABLET | ORAL | Status: DC

## 2013-09-20 MED ORDER — ZOLPIDEM TARTRATE 10 MG PO TABS: ORAL_TABLET | ORAL | Status: DC

## 2013-09-20 NOTE — Patient Instructions (Signed)
Begin low dose SSRI to see if it helps her anxiety eventually it might help  Sleep.  Recheck in about 4 weeks.  Caution with the Ambien do not take with alcohol or drive within 9 -10 hours.    Generalized Anxiety Disorder Generalized anxiety disorder (GAD) is a mental disorder. It interferes with life functions, including relationships, work, and school. GAD is different from normal anxiety, which everyone experiences at some point in their lives in response to specific life events and activities. Normal anxiety actually helps [place] prepare for and get through these life events and activities. Normal anxiety goes away after the event or activity is over.  GAD causes anxiety that is not necessarily related to specific events or activities. It also causes excess anxiety in proportion to specific events or activities. The anxiety associated with GAD is also difficult to control. GAD can vary from mild to severe. People with severe GAD can have intense waves of anxiety with physical symptoms (panic attacks).  SYMPTOMS The anxiety and worry associated with GAD are difficult to control. This anxiety and worry are related to many life events and activities and also occur more days than not for 6 months or longer. People with GAD also have three or more of the following symptoms (one or more in children):  Restlessness.   Fatigue.  Difficulty concentrating.   Irritability.  Muscle tension.  Difficulty sleeping or unsatisfying sleep. DIAGNOSIS GAD is diagnosed through an assessment by your caregiver. Your caregiver will ask you questions aboutyour mood,physical symptoms, and events in your life. Your caregiver may ask you about your medical history and use of alcohol or drugs, including prescription medications. Your caregiver may also do a physical exam and blood tests. Certain medical conditions and the use of certain substances can cause symptoms similar to those associated with GAD. Your  caregiver may refer you to a mental health specialist for further evaluation. TREATMENT The following therapies are usually used to treat GAD:   Medication Antidepressant medication usually is prescribed for long-term daily control. Antianxiety medications may be added in severe cases, especially when panic attacks occur.   Talk therapy (psychotherapy) Certain types of talk therapy can be helpful in treating GAD by providing support, education, and guidance. A form of talk therapy called cognitive behavioral therapy can teach you healthy ways to think about and react to daily life events and activities.  Stress managementtechniques These include yoga, meditation, and exercise and can be very helpful when they are practiced regularly. A mental health specialist can help determine which treatment is best for you. Some people see improvement with one therapy. However, other people require a combination of therapies. Document Released: 02/08/2013 Document Reviewed: 02/08/2013 [facility] Patient Information 2014 [place], [place].

## 2013-09-20 NOTE — Progress Notes (Signed)
Chief Complaint  Patient presents with  . Annual Exam    anxiety sleep    HPI: Patient comes in today for Preventive Health Care visit   Since her last visit no major changes in her health history although her left ear pops at times when she is congested.  Is found yourself bit more anxious especially when she's in the car and not driving worries about those things.  She uses a sleep aid Ambien to help her sleep at night when she changed generics didn't work as well.  There is a family history of anxiety 2 daughters are on SSRIs one is on the Celexa.  She doesn't have full-blown panic attacks but does think anxiety keeps her from sleeping and doing certain things.  She uses the estrogen cream for up to 2-3 weeks and it helps a lot and then stops has questions about whether oral medicines would be better.  ROS:  GEN/ HEENT: No fever, significant weight changes sweats headaches vision problems hearing changes, CV/ PULM; No chest pain shortness of breath cough, syncope,edema  change in exercise tolerance. GI /GU: No adominal pain, vomiting, change in bowel habits. No blood in the stool. No significant GU symptoms. SKIN/HEME: ,no acute skin rashes suspicious lesions or bleeding. No lymphadenopathy, nodules, masses.  NEURO/ PSYCH:  No neurologic signs such as weakness numbness. No depression anxiety. IMM/ Allergy: No unusual infections.  Allergy .   REST of 12 system review negative except as per HPI   Past Medical History  Diagnosis Date  . Hx of varicella     as a child  . Decreased hearing   . H/O cholesteatoma     surgery [facility]   Past Surgical History  Procedure Laterality Date  . Repeat cesarean section      had 3  . Inner ear surgery      at [facility] for Cholesteatoma artificial ear drum    Family History  Problem Relation Age of Onset  . Lung cancer Mother   . Thyroid disease Daughter     graves    History   Social History  . Marital Status: Married    Spouse  Name: N/A    Number of Children: N/A  . Years of Education: N/A   Social History Main Topics  . Smoking status: Never Smoker   . Smokeless tobacco: Never Used  . Alcohol Use: 4.8 oz/week    8 Glasses of wine per week  . Drug Use: No  . Sexual Activity: None   Other Topics Concern  . None   Social History Narrative   hh of 2    No tobacco  Few wine per weekend.    Pet dog    No ets.    Retired   Used to sleep 6-8 hours pm   Help take care of grandchild born in [place] on and off   Exercise walking.      Outpatient Encounter Prescriptions as of 09/20/2013  Medication Sig  . estradiol (ESTRACE VAGINAL) 0.1 MG/GM vaginal cream Use 2 gram as directed or 3 x per week.  . Ibuprofen 200 MG CAPS Take 1 capsule by mouth as needed.    . zolpidem (AMBIEN) 10 MG tablet TAKE 1/2-1 TABLET BY MOUTH AT BEDTIME FOR SLEEP  . [DISCONTINUED] zolpidem (AMBIEN) 10 MG tablet TAKE 1/2-1 TABLET BY MOUTH AT BEDTIME FOR SLEEP  . citalopram (CELEXA) 20 MG tablet Take half a pill or 10 mg each day for one to 2  weeks then increase to one a day    EXAM:  BP 150/86  Pulse 77  Temp(Src) 98 F (36.7 C) (Oral)  Ht 5' 3.25" (1.607 m)  Wt 163 lb (73.936 kg)  BMI 28.63 kg/m2  SpO2 98%  Body mass index is 28.63 kg/(m^2).  Physical Exam: Vital signs reviewed NGE:XBMW is a well-developed well-nourished alert cooperative   female who appears her stated age in no acute distress.  HEENT: normocephalic atraumatic , Eyes: PERRL EOM's full, conjunctiva clear, Nares: paten,t no deformity discharge or tenderness., Ears: no deformity EAC's clear TMs right refill left appears with normal landmarks. Mouth: clear OP, no lesions, edema.  Moist mucous membranes. Dentition in adequate repair. NECK: supple without masses, thyromegaly or bruits. CHEST/PULM:  Clear to auscultation and percussion breath sounds equal no wheeze , rales or rhonchi. No chest wall deformities or tenderness. Breast: normal by inspection . No  dimpling, discharge, masses, tenderness or discharge . CV: PMI is nondisplaced, S1 S2 no gallops, murmurs, rubs. Peripheral pulses are full without delay.No JVD .  ABDOMEN: Bowel sounds normal nontender  No guard or rebound, no hepato splenomegal no CVA tenderness.  No hernia. Extremtities:  No clubbing cyanosis or edema, no acute joint swelling or redness no focal atrophy NEURO:  Oriented x3, cranial nerves 3-12 appear to be intact, no obvious focal weakness,gait within normal limits no abnormal reflexes or asymmetrical SKIN: No acute rashes normal turgor, color, no bruising or petechiae. PSYCH: Oriented, good eye contact, no obvious depression minimal anxiety, cognition and judgment appear normal. LN: no cervical axillary inguinal adenopathy  Lab Results  Component Value Date   WBC 5.4 09/13/2013   HGB 12.8 09/13/2013   HCT 37.8 09/13/2013   PLT 269.0 09/13/2013   GLUCOSE 90 09/13/2013   CHOL 222* 09/13/2013   TRIG 79.0 09/13/2013   HDL 64.70 09/13/2013   LDLDIRECT 142.7 09/13/2013   ALT 19 09/13/2013   AST 22 09/13/2013   NA 139 09/13/2013   K 4.1 09/13/2013   CL 105 09/13/2013   CREATININE 0.9 09/13/2013   BUN 26* 09/13/2013   CO2 29 09/13/2013   TSH 1.92 09/13/2013    ASSESSMENT AND PLAN:  Discussed the following assessment and plan:  Visit for preventive health examination - Get mammogram check on shingles vaccine.  Elevated blood pressure reading  Unspecified hearing loss  Anxiety state, unspecified - Possibly aggravating her insomnia.  Insomnia, unspecified - Possibly aggravated by anxiety dependent numbness at this time risk-benefit discussed. Taking 10 mg add SSRI follow  Postmenopausal atrophic vaginitis - Reviewed how to use medication at this time would avoid oral medicine risk more than benefit The cousin about possibilities increasing his anxiety we'll try a low dose SSRI and followup consideration of altering her sleep medicine if helpful. Follow up in  about a month. Monitor blood pressure readings we'll recheck a followup. Laboratory studies reviewed today Patient Care Team: Madelin Headings, MD as PCP - General Hart Carwin, MD (Gastroenterology) Patient Instructions  Begin low dose SSRI to see if it helps her anxiety eventually it might help  Sleep.  Recheck in about 4 weeks.  Caution with the Ambien do not take with alcohol or drive within 9 -10 hours.    Generalized Anxiety Disorder Generalized anxiety disorder (GAD) is a mental disorder. It interferes with life functions, including relationships, work, and school. GAD is different from normal anxiety, which everyone experiences at some point in their lives in response to specific life events  and activities. Normal anxiety actually helps [place] prepare for and get through these life events and activities. Normal anxiety goes away after the event or activity is over.  GAD causes anxiety that is not necessarily related to specific events or activities. It also causes excess anxiety in proportion to specific events or activities. The anxiety associated with GAD is also difficult to control. GAD can vary from mild to severe. People with severe GAD can have intense waves of anxiety with physical symptoms (panic attacks).  SYMPTOMS The anxiety and worry associated with GAD are difficult to control. This anxiety and worry are related to many life events and activities and also occur more days than not for 6 months or longer. People with GAD also have three or more of the following symptoms (one or more in children):  Restlessness.   Fatigue.  Difficulty concentrating.   Irritability.  Muscle tension.  Difficulty sleeping or unsatisfying sleep. DIAGNOSIS GAD is diagnosed through an assessment by your caregiver. Your caregiver will ask you questions aboutyour mood,physical symptoms, and events in your life. Your caregiver may ask you about your medical history and use of alcohol or  drugs, including prescription medications. Your caregiver may also do a physical exam and blood tests. Certain medical conditions and the use of certain substances can cause symptoms similar to those associated with GAD. Your caregiver may refer you to a mental health specialist for further evaluation. TREATMENT The following therapies are usually used to treat GAD:   Medication Antidepressant medication usually is prescribed for long-term daily control. Antianxiety medications may be added in severe cases, especially when panic attacks occur.   Talk therapy (psychotherapy) Certain types of talk therapy can be helpful in treating GAD by providing support, education, and guidance. A form of talk therapy called cognitive behavioral therapy can teach you healthy ways to think about and react to daily life events and activities.  Stress managementtechniques These include yoga, meditation, and exercise and can be very helpful when they are practiced regularly. A mental health specialist can help determine which treatment is best for you. Some people see improvement with one therapy. However, other people require a combination of therapies. Document Released: 02/08/2013 Document Reviewed: 02/08/2013 [facility] Patient Information 2014 [place], [place].     Neta Mends. Panosh M.D.  Health Maintenance  Topic Date Due  . Zostavax  03/25/2011  . Mammogram  09/16/2012  . Influenza Vaccine  05/28/2014  . Tetanus/tdap  10/28/2014  . Pap Smear  09/19/2015  . Colonoscopy  02/19/2021   Health Maintenance Review

## 2013-10-13 ENCOUNTER — Other Ambulatory Visit: Payer: Self-pay | Admitting: Internal Medicine

## 2013-10-13 ENCOUNTER — Telehealth: Payer: Self-pay | Admitting: Internal Medicine

## 2013-10-13 NOTE — Telephone Encounter (Signed)
Pt needs new rx generic ambien [facility]. Pt was gave a written rx and accidentally shred rx

## 2013-10-14 NOTE — Telephone Encounter (Signed)
Called the pharmacy and spoke to technician.  Pt is due for refills.  Technician also stated that she checked with controlled medication log.  Called in refills according to what was printed on 09/20/13.

## 2014-02-04 ENCOUNTER — Encounter: Payer: Self-pay | Admitting: Internal Medicine

## 2014-02-04 NOTE — Progress Notes (Signed)
tox screen from nov 14 pos tramadol and negative for [facility]

## 2014-02-14 ENCOUNTER — Other Ambulatory Visit: Payer: Self-pay | Admitting: Internal Medicine

## 2014-03-01 ENCOUNTER — Encounter: Payer: Self-pay | Admitting: Internal Medicine

## 2014-03-11 ENCOUNTER — Other Ambulatory Visit: Payer: Self-pay | Admitting: Internal Medicine

## 2014-03-11 NOTE — Telephone Encounter (Signed)
Due for FU by end of JUne  Can refill x 1 for 30 #  Get fu appt before runs out.

## 2014-03-14 NOTE — Telephone Encounter (Signed)
Called to the pharmacy and left on voicemail. 

## 2014-05-10 ENCOUNTER — Other Ambulatory Visit: Payer: Self-pay | Admitting: Internal Medicine

## 2014-05-10 NOTE — Telephone Encounter (Signed)
Called to the pharmacy and left on voicemail.  #30 per WP.

## 2014-06-03 ENCOUNTER — Other Ambulatory Visit: Payer: Self-pay | Admitting: Internal Medicine

## 2014-06-03 NOTE — Telephone Encounter (Signed)
Overdue for ov  medcheck  Ok to refill 30 ov before any more refills

## 2014-06-03 NOTE — Telephone Encounter (Signed)
Called to the pharmacy and left on voicemail. 

## 2014-07-09 ENCOUNTER — Other Ambulatory Visit: Payer: Self-pay | Admitting: Internal Medicine

## 2014-07-12 NOTE — Telephone Encounter (Signed)
Last 3 refill requests   wer dependent on  ROV med check  No  appts  have been made and she is overdue.

## 2014-07-14 ENCOUNTER — Ambulatory Visit (INDEPENDENT_AMBULATORY_CARE_PROVIDER_SITE_OTHER): Payer: 59 | Admitting: Internal Medicine

## 2014-07-14 ENCOUNTER — Encounter: Payer: Self-pay | Admitting: Internal Medicine

## 2014-07-14 VITALS — BP 138/80 | Temp 98.0°F | Ht 63.25 in | Wt 167.0 lb

## 2014-07-14 DIAGNOSIS — F411 Generalized anxiety disorder: Secondary | ICD-10-CM

## 2014-07-14 DIAGNOSIS — Z79899 Other long term (current) drug therapy: Secondary | ICD-10-CM

## 2014-07-14 DIAGNOSIS — G4700 Insomnia, unspecified: Secondary | ICD-10-CM

## 2014-07-14 DIAGNOSIS — R030 Elevated blood-pressure reading, without diagnosis of hypertension: Secondary | ICD-10-CM

## 2014-07-14 MED ORDER — ZOLPIDEM TARTRATE 10 MG PO TABS: ORAL_TABLET | ORAL | Status: DC

## 2014-07-14 MED ORDER — TRAZODONE HCL 50 MG PO TABS: 25.0000 mg | ORAL_TABLET | Freq: Every evening | ORAL | Status: DC | PRN

## 2014-07-14 NOTE — Progress Notes (Signed)
Pre visit review using our clinic review tool, if applicable. No additional management support is needed unless otherwise documented below in the visit note.  Chief Complaint  Patient presents with  . Follow-up    HPI: Tracy Pena is 63 y.o. On medication for anxiety and sleep disturbance and was called in as over due for medication evaluation No major change in health status since last visit . Seen nov 14   Stopped  celexa   After a month   Se  Felt spaced out and too sedate  Since then taking ambien mostly 10 mg and doestn sleep without it . idfferent manugffactureres work better tha other  Says her mind is very active at night and hard to sleep. With this  Aware of habit factors  durind day is busy and anxiety not effecting as much  Talked about trazadone. Never tried this  Exercise  Walking 3 x per week.   ETOH ocass wine RD no No other change in health  ROS: See pertinent positives and negatives per HPI.  Past Medical History  Diagnosis Date  . Hx of varicella     as a child  . Decreased hearing   . H/O cholesteatoma     surgery [facility]    Family History  Problem Relation Age of Onset  . Lung cancer Mother   . Thyroid disease Daughter     graves    History   Social History  . Marital Status: Married    Spouse Name: N/A    Number of Children: N/A  . Years of Education: N/A   Social History Main Topics  . Smoking status: Never Smoker   . Smokeless tobacco: Never Used  . Alcohol Use: 4.8 oz/week    8 Glasses of wine per week  . Drug Use: No  . Sexual Activity: None   Other Topics Concern  . None   Social History Narrative   hh of 2    No tobacco  Few wine per weekend.    Pet dog    No ets.    Retired   Used to sleep 6-8 hours pm   Help take care of grandchild born in [place] on and off   Exercise walking.      Outpatient Encounter Prescriptions as of 07/14/2014  Medication Sig  . ESTRACE VAGINAL 0.1 MG/GM vaginal cream Use 2 grams as directed  or 3 times per week  . Ibuprofen 200 MG CAPS Take 1 capsule by mouth as needed.    . zolpidem (AMBIEN) 10 MG tablet TAKE 1/2 TO 1 TABLET BY MOUTH AT BEDTIME wean as directed  . [DISCONTINUED] zolpidem (AMBIEN) 10 MG tablet TAKE 1/2 TO 1 TABLET BY MOUTH AT BEDTIME  . traZODone (DESYREL) 50 MG tablet Take 0.5-1 tablets (25-50 mg total) by mouth at bedtime as needed for sleep.  . [DISCONTINUED] citalopram (CELEXA) 20 MG tablet Take half a pill or 10 mg each day for one to 2 weeks then increase to one a day    EXAM:  BP 138/80  Temp(Src) 98 F (36.7 C) (Oral)  Ht 5' 3.25" (1.607 m)  Wt 167 lb (75.751 kg)  BMI 29.33 kg/m2  Body mass index is 29.33 kg/(m^2).  GENERAL: vitals reviewed and listed above, alert, oriented, appears well hydrated and in no acute distress HPSYCH: pleasant and cooperative, no obvious depression  Oriented x 3 and no noted deficits in memory, attention, and speech.  Lab Results  Component Value Date  WBC 5.4 09/13/2013   HGB 12.8 09/13/2013   HCT 37.8 09/13/2013   PLT 269.0 09/13/2013   GLUCOSE 90 09/13/2013   CHOL 222* 09/13/2013   TRIG 79.0 09/13/2013   HDL 64.70 09/13/2013   LDLDIRECT 142.7 09/13/2013   ALT 19 09/13/2013   AST 22 09/13/2013   NA 139 09/13/2013   K 4.1 09/13/2013   CL 105 09/13/2013   CREATININE 0.9 09/13/2013   BUN 26* 09/13/2013   CO2 29 09/13/2013   TSH 1.92 09/13/2013    ASSESSMENT AND PLAN:  Discussed the following assessment and plan:  SLEEPLESSNESS - Discussed at length old to get off medication on a regular basis other ways to deal with the evening anxiety mind games  Medication management  Elevated blood pressure reading - Better  Anxiety state, unspecified - side effect flattening apathy with celexa 20  consdier other or lower dose in future   Discuss transition wean from Ambien we'll get rebound insomnia and use of trazodone in increasing dosing. She can look into cognitive behavioral therapy and take melatonin in  addition for long-term plan of getting off medication. Consider other SSRI or Remeron at night or other options and eventually no medicine to maintain sleep. At this point she is dependent on the Ambien uncertain its newer medicines would be any better because we would switch on medicine for another. Long-term goal would be no regular medicines needed for sleep.   -Patient advised to return or notify health care team  if symptoms worsen ,persist or new concerns arise.  Patient Instructions  Try decreasing ambien to 5 mg    And then alternate every 2 days with the trazadone  2 days.  Try the melatonin 2-3 hours before  Sleep..  And then  Plan doing more days with trazadone  Over weeks. We can consider other anxiety  Attend to sleep hygiene.   Look into counseling  Cognitive behavioral therapy ( CBT)  As repoirts that it does ehlp with sleep training .    Insomnia Insomnia is frequent trouble falling and/or staying asleep. Insomnia can be a long term problem or a short term problem. Both are common. Insomnia can be a short term problem when the wakefulness is related to a certain stress or worry. Long term insomnia is often related to ongoing stress during waking hours and/or poor sleeping habits. Overtime, sleep deprivation itself can make the problem worse. Every little thing feels more severe because you are overtired and your ability to cope is decreased. CAUSES   Stress, anxiety, and depression.  Poor sleeping habits.  Distractions such as TV in the bedroom.  Naps close to bedtime.  Engaging in emotionally charged conversations before bed.  Technical reading before sleep.  Alcohol and other sedatives. They may make the problem worse. They can hurt normal sleep patterns and normal dream activity.  Stimulants such as caffeine for several hours prior to bedtime.  Pain syndromes and shortness of breath can cause insomnia.  Exercise late at night.  Changing time zones may cause  sleeping problems (jet lag). It is sometimes helpful to have someone observe your sleeping patterns. They should look for periods of not breathing during the night (sleep apnea). They should also look to see how long those periods last. If you live alone or observers are uncertain, you can also be observed at a sleep clinic where your sleep patterns will be professionally monitored. Sleep apnea requires a checkup and treatment. Give your caregivers your medical history.  Give your caregivers observations your family has made about your sleep.  SYMPTOMS   Not feeling rested in the morning.  Anxiety and restlessness at bedtime.  Difficulty falling and staying asleep. TREATMENT   Your caregiver may prescribe treatment for an underlying medical disorders. Your caregiver can give advice or help if you are using alcohol or other drugs for self-medication. Treatment of underlying problems will usually eliminate insomnia problems.  Medications can be prescribed for short time use. They are generally not recommended for lengthy use.  Over-the-counter sleep medicines are not recommended for lengthy use. They can be habit forming.  You can promote easier sleeping by making lifestyle changes such as:  Using relaxation techniques that help with breathing and reduce muscle tension.  Exercising earlier in the day.  Changing your diet and the time of your last meal. No night time snacks.  Establish a regular time to go to bed.  Counseling can help with stressful problems and worry.  Soothing music and white noise may be helpful if there are background noises you cannot remove.  Stop tedious detailed work at least one hour before bedtime. HOME CARE INSTRUCTIONS   Keep a diary. Inform your caregiver about your progress. This includes any medication side effects. See your caregiver regularly. Take note of:  Times when you are asleep.  Times when you are awake during the night.  The quality of  your sleep.  How you feel the next day. This information will help your caregiver care for you.  Get out of bed if you are still awake after 15 minutes. Read or do some quiet activity. Keep the lights down. Wait until you feel sleepy and go back to bed.  Keep regular sleeping and waking hours. Avoid naps.  Exercise regularly.  Avoid distractions at bedtime. Distractions include watching television or engaging in any intense or detailed activity like attempting to balance the household checkbook.  Develop a bedtime ritual. Keep a familiar routine of bathing, brushing your teeth, climbing into bed at the same time each night, listening to soothing music. Routines increase the success of falling to sleep faster.  Use relaxation techniques. This can be using breathing and muscle tension release routines. It can also include visualizing peaceful scenes. You can also help control troubling or intruding thoughts by keeping your mind occupied with boring or repetitive thoughts like the old concept of counting sheep. You can make it more creative like imagining planting one beautiful flower after another in your backyard garden.  During your day, work to eliminate stress. When this is not possible use some of the previous suggestions to help reduce the anxiety that accompanies stressful situations. MAKE SURE YOU:   Understand these instructions.  Will watch your condition.  Will get help right away if you are not doing well or get worse. Document Released: 10/11/2000 Document Revised: 01/06/2012 Document Reviewed: 11/11/2007 [facility] Patient Information 2015 [place], [place]. This information is not intended to replace advice given to you by your health care provider. Make sure you discuss any questions you have with your health care provider.    Standley Brooking. Keiran Sias M.D.

## 2014-07-14 NOTE — Patient Instructions (Signed)
Try decreasing ambien to 5 mg    And then alternate every 2 days with the trazadone  2 days.  Try the melatonin 2-3 hours before  Sleep..  And then  Plan doing more days with trazadone  Over weeks. We can consider other anxiety  Attend to sleep hygiene.   Look into counseling  Cognitive behavioral therapy ( CBT)  As repoirts that it does ehlp with sleep training .    Insomnia Insomnia is frequent trouble falling and/or staying asleep. Insomnia can be a long term problem or a short term problem. Both are common. Insomnia can be a short term problem when the wakefulness is related to a certain stress or worry. Long term insomnia is often related to ongoing stress during waking hours and/or poor sleeping habits. Overtime, sleep deprivation itself can make the problem worse. Every little thing feels more severe because you are overtired and your ability to cope is decreased. CAUSES   Stress, anxiety, and depression.  Poor sleeping habits.  Distractions such as TV in the bedroom.  Naps close to bedtime.  Engaging in emotionally charged conversations before bed.  Technical reading before sleep.  Alcohol and other sedatives. They may make the problem worse. They can hurt normal sleep patterns and normal dream activity.  Stimulants such as caffeine for several hours prior to bedtime.  Pain syndromes and shortness of breath can cause insomnia.  Exercise late at night.  Changing time zones may cause sleeping problems (jet lag). It is sometimes helpful to have someone observe your sleeping patterns. They should look for periods of not breathing during the night (sleep apnea). They should also look to see how long those periods last. If you live alone or observers are uncertain, you can also be observed at a sleep clinic where your sleep patterns will be professionally monitored. Sleep apnea requires a checkup and treatment. Give your caregivers your medical history. Give your caregivers  observations your family has made about your sleep.  SYMPTOMS   Not feeling rested in the morning.  Anxiety and restlessness at bedtime.  Difficulty falling and staying asleep. TREATMENT   Your caregiver may prescribe treatment for an underlying medical disorders. Your caregiver can give advice or help if you are using alcohol or other drugs for self-medication. Treatment of underlying problems will usually eliminate insomnia problems.  Medications can be prescribed for short time use. They are generally not recommended for lengthy use.  Over-the-counter sleep medicines are not recommended for lengthy use. They can be habit forming.  You can promote easier sleeping by making lifestyle changes such as:  Using relaxation techniques that help with breathing and reduce muscle tension.  Exercising earlier in the day.  Changing your diet and the time of your last meal. No night time snacks.  Establish a regular time to go to bed.  Counseling can help with stressful problems and worry.  Soothing music and white noise may be helpful if there are background noises you cannot remove.  Stop tedious detailed work at least one hour before bedtime. HOME CARE INSTRUCTIONS   Keep a diary. Inform your caregiver about your progress. This includes any medication side effects. See your caregiver regularly. Take note of:  Times when you are asleep.  Times when you are awake during the night.  The quality of your sleep.  How you feel the next day. This information will help your caregiver care for you.  Get out of bed if you are still awake after  15 minutes. Read or do some quiet activity. Keep the lights down. Wait until you feel sleepy and go back to bed.  Keep regular sleeping and waking hours. Avoid naps.  Exercise regularly.  Avoid distractions at bedtime. Distractions include watching television or engaging in any intense or detailed activity like attempting to balance the household  checkbook.  Develop a bedtime ritual. Keep a familiar routine of bathing, brushing your teeth, climbing into bed at the same time each night, listening to soothing music. Routines increase the success of falling to sleep faster.  Use relaxation techniques. This can be using breathing and muscle tension release routines. It can also include visualizing peaceful scenes. You can also help control troubling or intruding thoughts by keeping your mind occupied with boring or repetitive thoughts like the old concept of counting sheep. You can make it more creative like imagining planting one beautiful flower after another in your backyard garden.  During your day, work to eliminate stress. When this is not possible use some of the previous suggestions to help reduce the anxiety that accompanies stressful situations. MAKE SURE YOU:   Understand these instructions.  Will watch your condition.  Will get help right away if you are not doing well or get worse. Document Released: 10/11/2000 Document Revised: 01/06/2012 Document Reviewed: 11/11/2007 [facility] Patient Information 2015 [place], [place]. This information is not intended to replace advice given to you by your health care provider. Make sure you discuss any questions you have with your health care provider.

## 2014-07-15 NOTE — Telephone Encounter (Signed)
She has had appt  See OV

## 2014-08-12 ENCOUNTER — Other Ambulatory Visit: Payer: Self-pay

## 2014-08-24 ENCOUNTER — Other Ambulatory Visit: Payer: Self-pay | Admitting: Internal Medicine

## 2014-08-24 NOTE — Telephone Encounter (Signed)
Ok ambien x 1  Estrace x 6 months

## 2014-08-24 NOTE — Telephone Encounter (Signed)
Ambien called to the pharmacy and left on voicemail.  Estrace sent by e-scribe.

## 2014-08-29 ENCOUNTER — Encounter: Payer: Self-pay | Admitting: Internal Medicine

## 2014-10-18 ENCOUNTER — Other Ambulatory Visit: Payer: Self-pay | Admitting: Internal Medicine

## 2014-10-24 NOTE — Telephone Encounter (Signed)
Called to the pharmacy and left on machine. 

## 2014-10-24 NOTE — Telephone Encounter (Signed)
Ok x 1

## 2014-11-07 ENCOUNTER — Other Ambulatory Visit: Payer: 59 | Admitting: Internal Medicine

## 2014-11-07 ENCOUNTER — Other Ambulatory Visit (INDEPENDENT_AMBULATORY_CARE_PROVIDER_SITE_OTHER): Payer: 59

## 2014-11-07 DIAGNOSIS — Z0000 Encounter for general adult medical examination without abnormal findings: Secondary | ICD-10-CM

## 2014-11-07 LAB — LIPID PANEL
Cholesterol: 236 mg/dL — ABNORMAL HIGH (ref 0–200)
HDL: 57.6 mg/dL (ref >39.00)
LDL Cholesterol: 153 mg/dL — ABNORMAL HIGH (ref 0–99)
NONHDL: 178.4
TRIGLYCERIDES: 127 mg/dL (ref 0.0–149.0)
Total CHOL/HDL Ratio: 4
VLDL: 25.4 mg/dL (ref 0.0–40.0)

## 2014-11-07 LAB — CBC WITH DIFFERENTIAL/PLATELET
BASOS ABS: 0 10*3/uL (ref 0.0–0.1)
Basophils Relative: 0.6 % (ref 0.0–3.0)
EOS ABS: 0.2 10*3/uL (ref 0.0–0.7)
EOS PCT: 2.6 % (ref 0.0–5.0)
HEMATOCRIT: 40.4 % (ref 36.0–46.0)
Hemoglobin: 13.4 g/dL (ref 12.0–15.0)
Lymphocytes Relative: 38.8 % (ref 12.0–46.0)
Lymphs Abs: 2.7 10*3/uL (ref 0.7–4.0)
MCHC: 33.1 g/dL (ref 30.0–36.0)
MCV: 88.7 fl (ref 78.0–100.0)
MONO ABS: 0.5 10*3/uL (ref 0.1–1.0)
MONOS PCT: 8 % (ref 3.0–12.0)
NEUTROS ABS: 3.4 10*3/uL (ref 1.4–7.7)
Neutrophils Relative %: 50 % (ref 43.0–77.0)
Platelets: 346 10*3/uL (ref 150.0–400.0)
RBC: 4.55 Mil/uL (ref 3.87–5.11)
RDW: 14.8 % (ref 11.5–15.5)
WBC: 6.8 10*3/uL (ref 4.0–10.5)

## 2014-11-07 LAB — BASIC METABOLIC PANEL
BUN: 21 mg/dL (ref 6–23)
CO2: 27 mEq/L (ref 19–32)
Calcium: 9.6 mg/dL (ref 8.4–10.5)
Chloride: 106 mEq/L (ref 96–112)
Creatinine, Ser: 0.9 mg/dL (ref 0.4–1.2)
GFR: 65.4 mL/min (ref >60.00)
Glucose, Bld: 88 mg/dL (ref 70–99)
Potassium: 4.2 mEq/L (ref 3.5–5.1)
Sodium: 141 mEq/L (ref 135–145)

## 2014-11-07 LAB — HEPATIC FUNCTION PANEL
ALBUMIN: 4.3 g/dL (ref 3.5–5.2)
ALT: 16 U/L (ref 0–35)
AST: 20 U/L (ref 0–37)
Alkaline Phosphatase: 62 U/L (ref 39–117)
BILIRUBIN TOTAL: 0.4 mg/dL (ref 0.2–1.2)
Bilirubin, Direct: 0 mg/dL (ref 0.0–0.3)
TOTAL PROTEIN: 7.2 g/dL (ref 6.0–8.3)

## 2014-11-07 LAB — TSH: TSH: 1.52 u[IU]/mL (ref 0.35–4.50)

## 2014-11-14 ENCOUNTER — Ambulatory Visit (INDEPENDENT_AMBULATORY_CARE_PROVIDER_SITE_OTHER): Payer: 59 | Admitting: Internal Medicine

## 2014-11-14 ENCOUNTER — Encounter: Payer: Self-pay | Admitting: Internal Medicine

## 2014-11-14 VITALS — BP 150/72 | Temp 97.9°F | Ht 63.0 in | Wt 158.5 lb

## 2014-11-14 DIAGNOSIS — R030 Elevated blood-pressure reading, without diagnosis of hypertension: Secondary | ICD-10-CM

## 2014-11-14 DIAGNOSIS — G4700 Insomnia, unspecified: Secondary | ICD-10-CM

## 2014-11-14 DIAGNOSIS — Z79899 Other long term (current) drug therapy: Secondary | ICD-10-CM

## 2014-11-14 DIAGNOSIS — Z0000 Encounter for general adult medical examination without abnormal findings: Secondary | ICD-10-CM

## 2014-11-14 DIAGNOSIS — Z2821 Immunization not carried out because of patient refusal: Secondary | ICD-10-CM

## 2014-11-14 DIAGNOSIS — E785 Hyperlipidemia, unspecified: Secondary | ICD-10-CM

## 2014-11-14 MED ORDER — MIRTAZAPINE 15 MG PO TABS: 15.0000 mg | ORAL_TABLET | Freq: Every day | ORAL | Status: DC

## 2014-11-14 NOTE — Progress Notes (Signed)
Pre visit review using our clinic review tool, if applicable. No additional management support is needed unless otherwise documented below in the visit note.' Chief Complaint  Patient presents with  . Annual Exam    HPI: Patient  Tracy Pena  64 y.o. comes in today for Preventive Health Care visit   Bp took this am sudafed and m in law was caretaking .   Back other son.  Less stress  Not exercising until aJan   fam hx of ht in [place]  Sleep couldn't get off ambien taking 5  Tried trazadone fitfull sleep  Tends to use back lighting late .  Health Maintenance  Topic Date Due  . ZOSTAVAX  03/25/2011  . MAMMOGRAM  09/16/2012  . TETANUS/TDAP  10/28/2014  . INFLUENZA VACCINE  01/26/2015 (Originally 05/28/2014)  . PAP SMEAR  09/19/2015  . COLONOSCOPY  02/19/2021   Health Maintenance Review LIFESTYLE:  Exercise:  Walking and to [facility]  New  Tobacco/ETS: no  Alcohol: 1 few weeks  Sugar beverages: no Sleep: hours  About 8   Sleep of no sleep .  Drug use: no    Colonoscopy:  utd  PAP: MAMMO: due  recent tresses    ROS:  GEN/ HEENT: No fever, significant weight changes sweats headaches vision problems hearing changes, CV/ PULM; No chest pain shortness of breath cough, syncope,edema  change in exercise tolerance. GI /GU: No adominal pain, vomiting, change in bowel habits. No blood in the stool. No significant GU symptoms. SKIN/HEME: ,no acute skin rashes suspicious lesions or bleeding. No lymphadenopathy, nodules, masses.  NEURO/ PSYCH:  No neurologic signs such as weakness numbness. No depression anxiety. IMM/ Allergy: No unusual infections.  Allergy .   REST of 12 system review negative except as per HPI   Past Medical History  Diagnosis Date  . Hx of varicella     as a child  . Decreased hearing   . H/O cholesteatoma     surgery [facility]    Past Surgical History  Procedure Laterality Date  . Repeat cesarean section      had 3  . Inner ear surgery      at [facility] for  Cholesteatoma artificial ear drum    Family History  Problem Relation Age of Onset  . Lung cancer Mother   . Thyroid disease Daughter     graves    History   Social History  . Marital Status: Married    Spouse Name: N/A    Number of Children: N/A  . Years of Education: N/A   Social History Main Topics  . Smoking status: Never Smoker   . Smokeless tobacco: Never Used  . Alcohol Use: 4.8 oz/week    8 Glasses of wine per week  . Drug Use: No  . Sexual Activity: None   Other Topics Concern  . None   Social History Narrative   hh of 2    No tobacco  Few wine per weekend.    Pet dog    No ets.    Retired   Used to sleep 6-8 hours pm   Help take care of grandchild born in [place] on and off   Exercise walking.      Outpatient Encounter Prescriptions as of 11/14/2014  Medication Sig  . ESTRACE VAGINAL 0.1 MG/GM vaginal cream USE TWO GRAMS VAGINALLY THREE TIMES WEEKLY OR AS DIRECTED  . Ibuprofen 200 MG CAPS Take 1 capsule by mouth as needed.    Marland Kitchen  zolpidem (AMBIEN) 10 MG tablet TAKE 1/2 TO 1 TABLET BY MOUTH AT BEDTIME  . mirtazapine (REMERON) 15 MG tablet Take 1-2 tablets (15-30 mg total) by mouth at bedtime. For sleep  . [DISCONTINUED] traZODone (DESYREL) 50 MG tablet Take 0.5-1 tablets (25-50 mg total) by mouth at bedtime as needed for sleep.    EXAM:  BP 150/72 mmHg  Temp(Src) 97.9 F (36.6 C) (Oral)  Ht 5\' 3"  (1.6 m)  Wt 158 lb 8 oz (71.895 kg)  BMI 28.08 kg/m2  Body mass index is 28.08 kg/(m^2).  Physical Exam: Vital signs reviewed YIR:SWNI is a well-developed well-nourished alert cooperative    who appearsr stated age in no acute distress.  HEENT: normocephalic atraumatic , Eyes: PERRL EOM's full, conjunctiva clear, Nares: paten,t no deformity discharge or tenderness., Ears: no deformity EAC's clear TMs distorted from surgery implants  normal landmarks. Mouth: clear OP, no lesions, edema.  Moist mucous membranes. Dentition in adequate repair. NECK: supple  without masses, thyromegaly or bruits. CHEST/PULM:  Clear to auscultation and percussion breath sounds equal no wheeze , rales or rhonchi. No chest wall deformities or tenderness. CV: PMI is nondisplaced, S1 S2 no gallops, murmurs, rubs. Peripheral pulses are full without delay.No JVD .  Breast: normal by inspection . No dimpling, discharge, masses, tenderness or discharge . ABDOMEN: Bowel sounds normal nontender  No guard or rebound, no hepato splenomegal no CVA tenderness.  No hernia. Extremtities:  No clubbing cyanosis or edema, no acute joint swelling or redness no focal atrophy NEURO:  Oriented x3, cranial nerves 3-12 appear to be intact, no obvious focal weakness,gait within normal limits no abnormal reflexes or asymmetrical SKIN: No acute rashes normal turgor, color, no bruising or petechiae. PSYCH: Oriented, good eye contact, no obvious depression anxiety, cognition and judgment appear normal. LN: no cervical axillary inguinal adenopathy  Lab Results  Component Value Date   WBC 6.8 11/07/2014   HGB 13.4 11/07/2014   HCT 40.4 11/07/2014   PLT 346.0 11/07/2014   GLUCOSE 88 11/07/2014   CHOL 236* 11/07/2014   TRIG 127.0 11/07/2014   HDL 57.60 11/07/2014   LDLDIRECT 142.7 09/13/2013   LDLCALC 153* 11/07/2014   ALT 16 11/07/2014   AST 20 11/07/2014   NA 141 11/07/2014   K 4.2 11/07/2014   CL 106 11/07/2014   CREATININE 0.9 11/07/2014   BUN 21 11/07/2014   CO2 27 11/07/2014   TSH 1.52 11/07/2014   150/72 right repeat  ASSESSMENT AND PLAN:  Discussed the following assessment and plan:  Visit for preventive health examination  Medication management  Elevated blood pressure reading - had sudafed this am fam hx check readings may need to begin low dose acei send in readings my chart   Insomnia - tried trazadone couldnt get off ambien taking 5 mg trial remeron a few days a week to wean if possiblelight dark   disc back screen  also exercise   Hyperlipidemia  Influenza  vaccination declined  Patient Care Team: Burnis Medin, MD as PCP - General [place] Dragon, MD (Gastroenterology) Patient Instructions   Limit use of the ambien  To intermittent use because of dependent quality and risk of side effects  Can try alternating with remeron.  Readdress sleep hygiene .   Get mammogram and ear check.  Take blood pressure readings twice a day for 7- 10 days and then periodically .To ensure below 140/90   .Send in readings    ( my chart ok) We may need  to begin antihypertensive meds to reduce risk of heart attack and stroke.    Wt Readings from Last 3 Encounters:  11/14/14 158 lb 8 oz (71.895 kg)  07/14/14 167 lb (75.751 kg)  09/20/13 163 lb (73.936 kg)   BP Readings from Last 3 Encounters:  11/14/14 160/80  07/14/14 138/80  09/20/13 150/86     Healthy lifestyle includes : At least 150 minutes of exercise weeks  , weight at healthy levels, which is usually   BMI 19-25. Avoid trans fats and processed foods;  Increase fresh fruits and veges to 5 servings per day. And avoid sweet beverages including tea and juice. Mediterranean diet with olive oil and nuts have been noted to be heart and brain healthy . Avoid tobacco products . Limit  alcohol to  7 per week for women and 14 servings for men.  Get adequate sleep . Wear seat belts . Don't text and drive .       Standley Brooking. Panosh M.D.

## 2014-11-14 NOTE — Patient Instructions (Addendum)
Limit use of the ambien  To intermittent use because of dependent quality and risk of side effects  Can try alternating with remeron.  Readdress sleep hygiene .   Get mammogram and ear check.  Take blood pressure readings twice a day for 7- 10 days and then periodically .To ensure below 140/90   .Send in readings    ( my chart ok) We may need to begin antihypertensive meds to reduce risk of heart attack and stroke.    Wt Readings from Last 3 Encounters:  11/14/14 158 lb 8 oz (71.895 kg)  07/14/14 167 lb (75.751 kg)  09/20/13 163 lb (73.936 kg)   BP Readings from Last 3 Encounters:  11/14/14 160/80  07/14/14 138/80  09/20/13 150/86     Healthy lifestyle includes : At least 150 minutes of exercise weeks  , weight at healthy levels, which is usually   BMI 19-25. Avoid trans fats and processed foods;  Increase fresh fruits and veges to 5 servings per day. And avoid sweet beverages including tea and juice. Mediterranean diet with olive oil and nuts have been noted to be heart and brain healthy . Avoid tobacco products . Limit  alcohol to  7 per week for women and 14 servings for men.  Get adequate sleep . Wear seat belts . Don't text and drive .

## 2014-11-15 ENCOUNTER — Telehealth: Payer: Self-pay | Admitting: Internal Medicine

## 2014-11-15 NOTE — Telephone Encounter (Signed)
PA for mirtazapine was denied.  Patient's plan states medication is not approved for insomnia.

## 2014-11-17 NOTE — Telephone Encounter (Signed)
Spoke to the pt.  She will contact her insurance company to see if the ramelteon is covered.  Will wait on her call back.

## 2014-11-17 NOTE — Telephone Encounter (Signed)
Please see below.

## 2014-11-17 NOTE — Telephone Encounter (Signed)
Patient has stress  Adding to sleep problems    If that doesn't work then tell patient  This generic med wont be approved  The other med is ramelteon  Please see if this is covered by insuance before writing med  Also could consider seeing  Sleep specialist  .and do cognitive therapy for sleep  Help.

## 2014-11-23 ENCOUNTER — Other Ambulatory Visit: Payer: Self-pay | Admitting: Internal Medicine

## 2014-11-25 ENCOUNTER — Encounter: Payer: Self-pay | Admitting: Internal Medicine

## 2014-11-28 NOTE — Telephone Encounter (Signed)
Please send this message : Begin 5 mg lisinopril 1 po qd  Disp 90 refill x 1  Plan ROV in 2--3 months.  Sounds like your brain is dependent on ambien to sleep and  Other options seem to not work for  [facility] . Can go back on ambien  5 mg as needed  but   Continue to try weaning  add exercise day light in day . Sleep hygiene. Can discuss more at her ROV in 2-3 months

## 2014-11-30 ENCOUNTER — Other Ambulatory Visit: Payer: Self-pay | Admitting: Family Medicine

## 2014-11-30 MED ORDER — LISINOPRIL 5 MG PO TABS: 5.0000 mg | ORAL_TABLET | Freq: Every day | ORAL | Status: DC

## 2014-11-30 NOTE — Telephone Encounter (Signed)
LMOM for the pt to return my call. 

## 2014-11-30 NOTE — Telephone Encounter (Signed)
Pt took the ramelteon rx one night and felt like she had been hit by a truck. Pt has decided to go "cold [place]" off of the [place]. It has been 5 days. She is doing ok.

## 2014-12-20 NOTE — Telephone Encounter (Signed)
Can rx  20 # 5 mg  ambien  And take 3 days -4 days per week.  fo now

## 2014-12-21 ENCOUNTER — Other Ambulatory Visit: Payer: Self-pay | Admitting: Family Medicine

## 2014-12-21 MED ORDER — ZOLPIDEM TARTRATE 5 MG PO TABS: ORAL_TABLET | ORAL | Status: DC

## 2015-01-17 ENCOUNTER — Other Ambulatory Visit: Payer: Self-pay | Admitting: Internal Medicine

## 2015-01-18 NOTE — Telephone Encounter (Signed)
Ok to refill  X 1 

## 2015-01-19 NOTE — Telephone Encounter (Signed)
Called to the pharmacy and left on machine. 

## 2015-02-27 ENCOUNTER — Other Ambulatory Visit: Payer: Self-pay | Admitting: Internal Medicine

## 2015-02-28 NOTE — Telephone Encounter (Signed)
Sent to the pharmacy by e-scribe. 

## 2015-03-03 ENCOUNTER — Other Ambulatory Visit: Payer: Self-pay | Admitting: Internal Medicine

## 2015-04-10 MED ORDER — ZOLPIDEM TARTRATE 5 MG PO TABS: 5.0000 mg | ORAL_TABLET | Freq: Every evening | ORAL | Status: DC | PRN

## 2015-04-10 NOTE — Addendum Note (Signed)
Addended by: Miles Costain T on: 04/10/2015 08:27 AM   Modules accepted: Orders

## 2015-07-11 ENCOUNTER — Other Ambulatory Visit: Payer: Self-pay | Admitting: Internal Medicine

## 2015-09-26 ENCOUNTER — Other Ambulatory Visit: Payer: Self-pay | Admitting: Family Medicine

## 2015-09-27 NOTE — Telephone Encounter (Signed)
Sent to the pharmacy by e-scribe. 

## 2015-09-27 NOTE — Telephone Encounter (Signed)
Ok to refill for 90 days  

## 2015-11-28 ENCOUNTER — Other Ambulatory Visit: Payer: Self-pay | Admitting: Internal Medicine

## 2015-11-29 NOTE — Telephone Encounter (Signed)
Due for yearly  Visit or wellness   Refill x 1 months and schedule  Visit.

## 2015-11-30 ENCOUNTER — Other Ambulatory Visit: Payer: Self-pay | Admitting: Family Medicine

## 2015-11-30 ENCOUNTER — Telehealth: Payer: Self-pay | Admitting: Family Medicine

## 2015-11-30 DIAGNOSIS — Z0000 Encounter for general adult medical examination without abnormal findings: Secondary | ICD-10-CM

## 2015-11-30 NOTE — Telephone Encounter (Signed)
Sent to the pharmacy by e-scribe.  Message sent to scheduling to help the pt make a lab and cpx appts.  Lab orders placed.

## 2015-11-30 NOTE — Telephone Encounter (Signed)
Pt is past due for her cpx and lab work.  I have placed the lab orders.  Please help her to make both appointments.  Thanks! 

## 2015-12-01 NOTE — Telephone Encounter (Signed)
lmom for pt to call back

## 2015-12-05 NOTE — Telephone Encounter (Signed)
Pt has been sch

## 2016-01-26 ENCOUNTER — Other Ambulatory Visit: Payer: Self-pay | Admitting: Internal Medicine

## 2016-01-29 NOTE — Telephone Encounter (Signed)
Sent to the pharmacy by e-scribe.  Pt has scheduled cpx on 05/17/16

## 2016-04-25 ENCOUNTER — Other Ambulatory Visit: Payer: Self-pay | Admitting: Internal Medicine

## 2016-04-25 NOTE — Telephone Encounter (Signed)
FILLED FOR 4 MONTHS ON 01/29/2016.  SHOULD HAVE REFILLS ON FILE

## 2016-05-16 NOTE — Progress Notes (Signed)
Chief Complaint  Patient presents with  . Annual Exam    HPI:  Tracy Pena 65 y.o. comes in today for Preventive Medicare wellness visit .Since last visit.forgot    bpMed and not taking for months    But has lost weigh ti nhealthy manner taking trazadone for sleep and nothing else doing better   recnetl intermittent right low back pain  Some better with exercise no fever or progression ? Related to mattress.  No gu sx   Due for mammogram  Health Maintenance Review LIFESTYLE:  TAD ocass etoh  Sugar beverages:n Sleep:7 hours   MEDICARE DOCUMENT QUESTIONS  TO SCAN  Hearing:  Needs to see her ent at [facility] left ear  Draining ( hx cholesteatoma) ? When due   Vision:  No limitations at present . Last eye check UTD  Safety:  Has smoke detector and wears seat belts.  No firearms. No excess sun exposure. Sees dentist regularly.  Falls: n  Advance directive :  Reviewed   Not yet  Has paperwork?   Memory: Felt to be good  , no concern from her or her family.  Depression: No anhedonia unusual crying or depressive symptoms  Nutrition: Eats well balanced diet; adequate calcium and vitamin D. No swallowing chewing problems.  Injury: no major injuries in the last six months.  Other healthcare providers:  Reviewed today .  Social:  Lives with spouse married. Pet dog   Preventive parameters: up-to-date  Reviewed   ADLS:   There are no problems or need for assistance  driving, feeding, obtaining food, dressing, toileting and bathing, managing money using phone. She is independent.    ROS:  GEN/ HEENT: No fever, significant weight changes sweats headaches vision problems hearing changes, CV/ PULM; No chest pain shortness of breath cough, syncope,edema  change in exercise tolerance. GI /GU: No adominal pain, vomiting, change in bowel habits. No blood in the stool. No significant GU symptoms. SKIN/HEME: ,no acute skin rashes suspicious lesions or bleeding. No  lymphadenopathy, nodules, masses.  NEURO/ PSYCH:  No neurologic signs such as weakness numbness. No depression anxiety. IMM/ Allergy: No unusual infections.  Allergy .   REST of 12 system review negative except as per HPI   Past Medical History  Diagnosis Date  . Hx of varicella     as a child  . Decreased hearing   . H/O cholesteatoma     surgery [facility]    Family History  Problem Relation Age of Onset  . Lung cancer Mother   . Thyroid disease Daughter     [place]    Social History   Social History  . Marital Status: Married    Spouse Name: N/A  . Number of Children: N/A  . Years of Education: N/A   Social History Main Topics  . Smoking status: Never Smoker   . Smokeless tobacco: Never Used  . Alcohol Use: 4.8 oz/week    8 Glasses of wine per week  . Drug Use: No  . Sexual Activity: Not Asked   Other Topics Concern  . None   Social History Narrative   hh of 2    No tobacco  Few wine per weekend.    Pet dog    No ets.    Retired   Used to sleep 6-8 hours pm   Help take care of grandchild born in [place] on and off   Exercise walking.      Outpatient Encounter Prescriptions as of  05/17/2016  Medication Sig  . ESTRACE VAGINAL 0.1 MG/GM vaginal cream USE TWO GRAMS VAGINALLY THREE TIMES WEEKLY OR AS DIRECTED  . Ibuprofen 200 MG CAPS Take 1 capsule by mouth as needed.    Marland Kitchen lisinopril (PRINIVIL,ZESTRIL) 5 MG tablet TAKE ONE TABLET BY MOUTH EVERY DAY  . traZODone (DESYREL) 50 MG tablet TAKE HALF TO ONE TABLET BY MOUTH AT BEDTIME AS NEEDED FOR SLEEP  . [DISCONTINUED] mirtazapine (REMERON) 15 MG tablet Take 1-2 tablets (15-30 mg total) by mouth at bedtime. For sleep  . [DISCONTINUED] zolpidem (AMBIEN) 5 MG tablet Take 1 tablet (5 mg total) by mouth at bedtime as needed for sleep.   No facility-administered encounter medications on file as of 05/17/2016.    EXAM:  BP 128/78 mmHg  Temp(Src) 98.3 F (36.8 C) (Oral)  Ht 5' 3"  (1.6 m)  Wt 141 lb 4 oz (64.071 kg)  BMI  25.03 kg/m2  Body mass index is 25.03 kg/(m^2).  Physical Exam: Vital signs reviewed FVC:BSWH is a well-developed well-nourished alert cooperative   who appears stated age in no acute distress.  HEENT: normocephalic atraumatic , Eyes: PERRL EOM's full, conjunctiva clear, Nares: paten,t no deformity discharge or tenderness., Ears: no deformity EAC's clear TMs   Abnormal  changes bilateral  Moist on elft  eac . Mouth: clear OP, no lesions, edema.  Moist mucous membranes. Dentition in adequate repair. NECK: supple without masses, thyromegaly or bruits. CHEST/PULM:  Clear to auscultation and percussion breath sounds equal no wheeze , rales or rhonchi. No chest wall deformities or tenderness. CV: PMI is nondisplaced, S1 S2 no gallops, murmurs, rubs. Peripheral pulses are full without delay.No JVD . Breast: normal by inspection . No dimpling, discharge, masses, tenderness or discharge . ABDOMEN: Bowel sounds normal nontender  No guard or rebound, no hepato splenomegal no CVA tenderness.   Extremtities:  No clubbing cyanosis or edema, no acute joint swelling or redness no focal atrophy NEURO:  Oriented x3, cranial nerves 3-12 appear to be intact, no obvious focal weakness,gait within normal limits no abnormal reflexes or asymmetrical SKIN: No acute rashes normal turgor, color, no bruising or petechiae. PSYCH: Oriented, good eye contact, no obvious depression anxiety, cognition and judgment appear normal. LN: no cervical axillary inguinal adenopathy No noted deficits in memory, attention, and speech.   ASSESSMENT AND PLAN:  Discussed the following assessment and plan:  Welcome to Medicare preventive visit  Medication management - Plan: Basic metabolic panel, Lipid panel, TSH, Sedimentation rate, CANCELED: POCT Urinalysis, Dipstick  Elevated blood pressure reading - Plan: Basic metabolic panel, Lipid panel, TSH, Sedimentation rate, CANCELED: POCT Urinalysis, Dipstick  Hyperlipidemia - Plan:  Basic metabolic panel, Lipid panel, TSH, Sedimentation rate, CANCELED: POCT Urinalysis, Dipstick  Right low back pain, with sciatica presence unspecified - Plan: Basic metabolic panel, Lipid panel, TSH, Sedimentation rate, Urinalysis, CANCELED: POCT Urinalysis, Dipstick  Estrogen deficiency - Plan: DG Bone Density  Insomnia - better  H/O cholesteatoma - needs ear fu  disc pt to make appt will do referral if needed Overall she's improved her lifestyle and lost weight which may be the reason her blood pressure is improved. She will send in readings on my chart and if still controlled we'll not have to go back on her lisinopril. Agree with avoidance of mind altering medicines such as Ambien for sleep and attention sleep hygiene. Get bone density. No personal fractures but her history 3 of her sister had fracture of wrist on a minor fall. She is to get  her mammogram. After patient left it was discovered that she was not up-to-date on her immunizations such as Prevnar 13. We'll contact her when lab results are back about getting immunizations. ALSO URINALYSIS Wasn't completed for some reason .  Patient Care Team: Burnis Medin, MD as PCP - General [place] Dragon, MD (Gastroenterology)  Patient Instructions    Continue lifestyle intervention healthy eating and exercise . Get your mammogram   Wt Readings from Last 3 Encounters:  05/17/16 141 lb 4 oz (64.071 kg)  11/14/14 158 lb 8 oz (71.895 kg)  07/14/14 167 lb (75.751 kg)   See your hearing  Ear doic  Back exercise may help .  If  persistent or progressive  Consider PT   Sports medicine .  Will notify you  of labs when available.  Take blood pressure readings twice a day for 7- 10 days and then periodically .To ensure below 140/90   .Send in readings      You may be able to stay off medication  Since losing the weight .  Health Maintenance, Female Adopting a healthy lifestyle and getting preventive care can go a long way to promote health  and wellness. Talk with your health care provider about what schedule of regular examinations is right for you. This is a good chance for you to check in with your provider about disease prevention and staying healthy. In between checkups, there are plenty of things you can do on your own. Experts have done a lot of research about which lifestyle changes and preventive measures are most likely to keep you healthy. Ask your health care provider for more information. WEIGHT AND DIET  Eat a healthy diet  Be sure to include plenty of vegetables, fruits, low-fat dairy products, and lean protein.  Do not eat a lot of foods high in solid fats, added sugars, or salt.  Get regular exercise. This is one of the most important things you can do for your health.  Most adults should exercise for at least 150 minutes each week. The exercise should increase your heart rate and make you sweat (moderate-intensity exercise).  Most adults should also do strengthening exercises at least twice a week. This is in addition to the moderate-intensity exercise.  Maintain a healthy weight  Body mass index (BMI) is a measurement that can be used to identify possible weight problems. It estimates body fat based on height and weight. Your health care provider can help determine your BMI and help you achieve or maintain a healthy weight.  For females 45 years of age and older:   A BMI below 18.5 is considered underweight.  A BMI of 18.5 to 24.9 is normal.  A BMI of 25 to 29.9 is considered overweight.  A BMI of 30 and above is considered obese.  Watch levels of cholesterol and blood lipids  You should start having your blood tested for lipids and cholesterol at 65 years of age, then have this test every 5 years.  You may need to have your cholesterol levels checked more often if:  Your lipid or cholesterol levels are high.  You are older than 65 years of age.  You are at high risk for heart disease.   CANCER SCREENING   Lung Cancer  Lung cancer screening is recommended for adults 94-45 years old who are at high risk for lung cancer because of a history of smoking.  A yearly low-dose CT scan of the lungs is recommended for people  who:  Currently smoke.  Have quit within the past 15 years.  Have at least a 30-pack-year history of smoking. A pack year is smoking an average of one pack of cigarettes a day for 1 year.  Yearly screening should continue until it has been 15 years since you quit.  Yearly screening should stop if you develop a health problem that would prevent you from having lung cancer treatment.  Breast Cancer  Practice breast self-awareness. This means understanding how your breasts normally appear and feel.  It also means doing regular breast self-exams. Let your health care provider know about any changes, no matter how small.  If you are in your 20s or 30s, you should have a clinical breast exam (CBE) by a health care provider every 1-3 years as part of a regular health exam.  If you are 23 or older, have a CBE every year. Also consider having a breast X-ray (mammogram) every year.  If you have a family history of breast cancer, talk to your health care provider about genetic screening.  If you are at high risk for breast cancer, talk to your health care provider about having an MRI and a mammogram every year.  Breast cancer gene (BRCA) assessment is recommended for women who have family members with BRCA-related cancers. BRCA-related cancers include:  Breast.  Ovarian.  Tubal.  Peritoneal cancers.  Results of the assessment will determine the need for genetic counseling and BRCA1 and BRCA2 testing. Cervical Cancer Your health care provider may recommend that you be screened regularly for cancer of the pelvic organs (ovaries, uterus, and vagina). This screening involves a pelvic examination, including checking for microscopic changes to the surface of  your cervix (Pap test). You may be encouraged to have this screening done every 3 years, beginning at age 80.  For women ages 3-65, health care providers may recommend pelvic exams and Pap testing every 3 years, or they may recommend the Pap and pelvic exam, combined with testing for human papilloma virus (HPV), every 5 years. Some types of HPV increase your risk of cervical cancer. Testing for HPV may also be done on women of any age with unclear Pap test results.  Other health care providers may not recommend any screening for nonpregnant women who are considered low risk for pelvic cancer and who do not have symptoms. Ask your health care provider if a screening pelvic exam is right for you.  If you have had past treatment for cervical cancer or a condition that could lead to cancer, you need Pap tests and screening for cancer for at least 20 years after your treatment. If Pap tests have been discontinued, your risk factors (such as having a new sexual partner) need to be reassessed to determine if screening should resume. Some women have medical problems that increase the chance of getting cervical cancer. In these cases, your health care provider may recommend more frequent screening and Pap tests. Colorectal Cancer  This type of cancer can be detected and often prevented.  Routine colorectal cancer screening usually begins at 65 years of age and continues through 65 years of age.  Your health care provider may recommend screening at an earlier age if you have risk factors for colon cancer.  Your health care provider may also recommend using home test kits to check for hidden blood in the stool.  A small camera at the end of a tube can be used to examine your colon directly (sigmoidoscopy or colonoscopy). This  is done to check for the earliest forms of colorectal cancer.  Routine screening usually begins at age 9.  Direct examination of the colon should be repeated every 5-10 years  through 65 years of age. However, you may need to be screened more often if early forms of precancerous polyps or small growths are found. Skin Cancer  Check your skin from head to toe regularly.  Tell your health care provider about any new moles or changes in moles, especially if there is a change in a mole's shape or color.  Also tell your health care provider if you have a mole that is larger than the size of a pencil eraser.  Always use sunscreen. Apply sunscreen liberally and repeatedly throughout the day.  Protect yourself by wearing long sleeves, pants, a wide-brimmed hat, and sunglasses whenever you are outside. HEART DISEASE, DIABETES, AND HIGH BLOOD PRESSURE   High blood pressure causes heart disease and increases the risk of stroke. High blood pressure is more likely to develop in:  People who have blood pressure in the high end of the normal range (130-139/85-89 mm Hg).  People who are overweight or obese.  People who are African American.  If you are 74-5 years of age, have your blood pressure checked every 3-5 years. If you are 62 years of age or older, have your blood pressure checked every year. You should have your blood pressure measured twice--once when you are at a hospital or clinic, and once when you are not at a hospital or clinic. Record the average of the two measurements. To check your blood pressure when you are not at a hospital or clinic, you can use:  An automated blood pressure machine at a pharmacy.  A home blood pressure monitor.  If you are between 82 years and 7 years old, ask your health care provider if you should take aspirin to prevent strokes.  Have regular diabetes screenings. This involves taking a blood sample to check your fasting blood sugar level.  If you are at a normal weight and have a low risk for diabetes, have this test once every three years after 65 years of age.  If you are overweight and have a high risk for diabetes,  consider being tested at a younger age or more often. PREVENTING INFECTION  Hepatitis B  If you have a higher risk for hepatitis B, you should be screened for this virus. You are considered at high risk for hepatitis B if:  You were born in a country where hepatitis B is common. Ask your health care provider which countries are considered high risk.  Your parents were born in a high-risk country, and you have not been immunized against hepatitis B (hepatitis B vaccine).  You have HIV or AIDS.  You use needles to inject street drugs.  You live with someone who has hepatitis B.  You have had sex with someone who has hepatitis B.  You get hemodialysis treatment.  You take certain medicines for conditions, including cancer, organ transplantation, and autoimmune conditions. Hepatitis C  Blood testing is recommended for:  Everyone born from [birth year] through 1965.  Anyone with known risk factors for hepatitis C. Sexually transmitted infections (STIs)  You should be screened for sexually transmitted infections (STIs) including gonorrhea and chlamydia if:  You are sexually active and are younger than 65 years of age.  You are older than 65 years of age and your health care provider tells you that you are at  risk for this type of infection.  Your sexual activity has changed since you were last screened and you are at an increased risk for chlamydia or gonorrhea. Ask your health care provider if you are at risk.  If you do not have HIV, but are at risk, it may be recommended that you take a prescription medicine daily to prevent HIV infection. This is called pre-exposure prophylaxis (PrEP). You are considered at risk if:  You are sexually active and do not regularly use condoms or know the HIV status of your partner(s).  You take drugs by injection.  You are sexually active with a partner who has HIV. Talk with your health care provider about whether you are at high risk of being  infected with HIV. If you choose to begin PrEP, you should first be tested for HIV. You should then be tested every 3 months for as long as you are taking PrEP.  PREGNANCY   If you are premenopausal and you may become pregnant, ask your health care provider about preconception counseling.  If you may become pregnant, take 400 to 800 micrograms (mcg) of folic acid every day.  If you want to prevent pregnancy, talk to your health care provider about birth control (contraception). OSTEOPOROSIS AND MENOPAUSE   Osteoporosis is a disease in which the bones lose minerals and strength with aging. This can result in serious bone fractures. Your risk for osteoporosis can be identified using a bone density scan.  If you are 68 years of age or older, or if you are at risk for osteoporosis and fractures, ask your health care provider if you should be screened.  Ask your health care provider whether you should take a calcium or vitamin D supplement to lower your risk for osteoporosis.  Menopause may have certain physical symptoms and risks.  Hormone replacement therapy may reduce some of these symptoms and risks. Talk to your health care provider about whether hormone replacement therapy is right for you.  HOME CARE INSTRUCTIONS   Schedule regular health, dental, and eye exams.  Stay current with your immunizations.   Do not use any tobacco products including cigarettes, chewing tobacco, or electronic cigarettes.  If you are pregnant, do not drink alcohol.  If you are breastfeeding, limit how much and how often you drink alcohol.  Limit alcohol intake to no more than 1 drink per day for nonpregnant women. One drink equals 12 ounces of beer, 5 ounces of wine, or 1 ounces of hard liquor.  Do not use street drugs.  Do not share needles.  Ask your health care provider for help if you need support or information about quitting drugs.  Tell your health care provider if you often feel  depressed.  Tell your health care provider if you have ever been abused or do not feel safe at home.   This information is not intended to replace advice given to you by your health care provider. Make sure you discuss any questions you have with your health care provider.   Document Released: 04/29/2011 Document Revised: 11/04/2014 Document Reviewed: 09/15/2013 Elsevier Interactive Patient Education 2016 [place] K. Panosh M.D.    Health Maintenance  Topic Date Due  . Hepatitis C Screening  02/16/1951  . HIV Screening  03/[birth year]/1966  . ZOSTAVAX  03/25/2011  . MAMMOGRAM  09/16/2012  . TETANUS/TDAP  10/28/2014  . PAP SMEAR  09/19/2015  . DEXA SCAN  03/24/2016  . PNA vac Low  Risk Adult (1 of 2 - PCV13) 03/24/2016  . INFLUENZA VACCINE  05/28/2016  . COLONOSCOPY  02/19/2021

## 2016-05-17 ENCOUNTER — Ambulatory Visit (INDEPENDENT_AMBULATORY_CARE_PROVIDER_SITE_OTHER): Payer: Medicare Other | Admitting: Internal Medicine

## 2016-05-17 ENCOUNTER — Other Ambulatory Visit: Payer: Self-pay | Admitting: Internal Medicine

## 2016-05-17 ENCOUNTER — Encounter: Payer: Self-pay | Admitting: Internal Medicine

## 2016-05-17 VITALS — BP 128/78 | Temp 98.3°F | Ht 63.0 in | Wt 141.2 lb

## 2016-05-17 DIAGNOSIS — Z79899 Other long term (current) drug therapy: Secondary | ICD-10-CM

## 2016-05-17 DIAGNOSIS — M545 Low back pain: Secondary | ICD-10-CM | POA: Diagnosis not present

## 2016-05-17 DIAGNOSIS — Z8669 Personal history of other diseases of the nervous system and sense organs: Secondary | ICD-10-CM

## 2016-05-17 DIAGNOSIS — E2839 Other primary ovarian failure: Secondary | ICD-10-CM

## 2016-05-17 DIAGNOSIS — Z0000 Encounter for general adult medical examination without abnormal findings: Secondary | ICD-10-CM

## 2016-05-17 DIAGNOSIS — Z1231 Encounter for screening mammogram for malignant neoplasm of breast: Secondary | ICD-10-CM

## 2016-05-17 DIAGNOSIS — E785 Hyperlipidemia, unspecified: Secondary | ICD-10-CM

## 2016-05-17 DIAGNOSIS — G4700 Insomnia, unspecified: Secondary | ICD-10-CM

## 2016-05-17 DIAGNOSIS — R030 Elevated blood-pressure reading, without diagnosis of hypertension: Secondary | ICD-10-CM

## 2016-05-17 LAB — BASIC METABOLIC PANEL
BUN: 20 mg/dL (ref 6–23)
CO2: 31 mEq/L (ref 19–32)
Calcium: 10.3 mg/dL (ref 8.4–10.5)
Chloride: 103 mEq/L (ref 96–112)
Creatinine, Ser: 1.06 mg/dL (ref 0.40–1.20)
GFR: 55.27 mL/min — AB (ref >60.00)
GLUCOSE: 92 mg/dL (ref 70–99)
Potassium: 3.9 mEq/L (ref 3.5–5.1)
Sodium: 140 mEq/L (ref 135–145)

## 2016-05-17 LAB — LIPID PANEL
CHOL/HDL RATIO: 4
Cholesterol: 215 mg/dL — ABNORMAL HIGH (ref 0–200)
HDL: 59 mg/dL (ref >39.00)
LDL Cholesterol: 131 mg/dL — ABNORMAL HIGH (ref 0–99)
NONHDL: 156.09
Triglycerides: 123 mg/dL (ref 0.0–149.0)
VLDL: 24.6 mg/dL (ref 0.0–40.0)

## 2016-05-17 LAB — SEDIMENTATION RATE: Sed Rate: 20 mm/hr (ref 0–30)

## 2016-05-17 LAB — TSH: TSH: 0.87 u[IU]/mL (ref 0.35–4.50)

## 2016-05-17 NOTE — Patient Instructions (Addendum)
Continue lifestyle intervention healthy eating and exercise . Get your mammogram   Wt Readings from Last 3 Encounters:  05/17/16 141 lb 4 oz (64.071 kg)  11/14/14 158 lb 8 oz (71.895 kg)  07/14/14 167 lb (75.751 kg)   See your hearing  Ear doic  Back exercise may help .  If  persistent or progressive  Consider PT   Sports medicine .  Will notify you  of labs when available.  Take blood pressure readings twice a day for 7- 10 days and then periodically .To ensure below 140/90   .Send in readings      You may be able to stay off medication  Since losing the weight .  Health Maintenance, Female Adopting a healthy lifestyle and getting preventive care can go a long way to promote health and wellness. Talk with your health care provider about what schedule of regular examinations is right for you. This is a good chance for you to check in with your provider about disease prevention and staying healthy. In between checkups, there are plenty of things you can do on your own. Experts have done a lot of research about which lifestyle changes and preventive measures are most likely to keep you healthy. Ask your health care provider for more information. WEIGHT AND DIET  Eat a healthy diet  Be sure to include plenty of vegetables, fruits, low-fat dairy products, and lean protein.  Do not eat a lot of foods high in solid fats, added sugars, or salt.  Get regular exercise. This is one of the most important things you can do for your health.  Most adults should exercise for at least 150 minutes each week. The exercise should increase your heart rate and make you sweat (moderate-intensity exercise).  Most adults should also do strengthening exercises at least twice a week. This is in addition to the moderate-intensity exercise.  Maintain a healthy weight  Body mass index (BMI) is a measurement that can be used to identify possible weight problems. It estimates body fat based on height and weight.  Your health care provider can help determine your BMI and help you achieve or maintain a healthy weight.  For females 83 years of age and older:   A BMI below 18.5 is considered underweight.  A BMI of 18.5 to 24.9 is normal.  A BMI of 25 to 29.9 is considered overweight.  A BMI of 30 and above is considered obese.  Watch levels of cholesterol and blood lipids  You should start having your blood tested for lipids and cholesterol at 65 years of age, then have this test every 5 years.  You may need to have your cholesterol levels checked more often if:  Your lipid or cholesterol levels are high.  You are older than 65 years of age.  You are at high risk for heart disease.  CANCER SCREENING   Lung Cancer  Lung cancer screening is recommended for adults 73-69 years old who are at high risk for lung cancer because of a history of smoking.  A yearly low-dose CT scan of the lungs is recommended for people who:  Currently smoke.  Have quit within the past 15 years.  Have at least a 30-pack-year history of smoking. A pack year is smoking an average of one pack of cigarettes a day for 1 year.  Yearly screening should continue until it has been 15 years since you quit.  Yearly screening should stop if you develop a health problem  that would prevent you from having lung cancer treatment.  Breast Cancer  Practice breast self-awareness. This means understanding how your breasts normally appear and feel.  It also means doing regular breast self-exams. Let your health care provider know about any changes, no matter how small.  If you are in your 20s or 30s, you should have a clinical breast exam (CBE) by a health care provider every 1-3 years as part of a regular health exam.  If you are 19 or older, have a CBE every year. Also consider having a breast X-ray (mammogram) every year.  If you have a family history of breast cancer, talk to your health care provider about genetic  screening.  If you are at high risk for breast cancer, talk to your health care provider about having an MRI and a mammogram every year.  Breast cancer gene (BRCA) assessment is recommended for women who have family members with BRCA-related cancers. BRCA-related cancers include:  Breast.  Ovarian.  Tubal.  Peritoneal cancers.  Results of the assessment will determine the need for genetic counseling and BRCA1 and BRCA2 testing. Cervical Cancer Your health care provider may recommend that you be screened regularly for cancer of the pelvic organs (ovaries, uterus, and vagina). This screening involves a pelvic examination, including checking for microscopic changes to the surface of your cervix (Pap test). You may be encouraged to have this screening done every 3 years, beginning at age 46.  For women ages 46-65, health care providers may recommend pelvic exams and Pap testing every 3 years, or they may recommend the Pap and pelvic exam, combined with testing for human papilloma virus (HPV), every 5 years. Some types of HPV increase your risk of cervical cancer. Testing for HPV may also be done on women of any age with unclear Pap test results.  Other health care providers may not recommend any screening for nonpregnant women who are considered low risk for pelvic cancer and who do not have symptoms. Ask your health care provider if a screening pelvic exam is right for you.  If you have had past treatment for cervical cancer or a condition that could lead to cancer, you need Pap tests and screening for cancer for at least 20 years after your treatment. If Pap tests have been discontinued, your risk factors (such as having a new sexual partner) need to be reassessed to determine if screening should resume. Some women have medical problems that increase the chance of getting cervical cancer. In these cases, your health care provider may recommend more frequent screening and Pap tests. Colorectal  Cancer  This type of cancer can be detected and often prevented.  Routine colorectal cancer screening usually begins at 65 years of age and continues through 65 years of age.  Your health care provider may recommend screening at an earlier age if you have risk factors for colon cancer.  Your health care provider may also recommend using home test kits to check for hidden blood in the stool.  A small camera at the end of a tube can be used to examine your colon directly (sigmoidoscopy or colonoscopy). This is done to check for the earliest forms of colorectal cancer.  Routine screening usually begins at age 52.  Direct examination of the colon should be repeated every 5-10 years through 64 years of age. However, you may need to be screened more often if early forms of precancerous polyps or small growths are found. Skin Cancer  Check your skin  from head to toe regularly.  Tell your health care provider about any new moles or changes in moles, especially if there is a change in a mole's shape or color.  Also tell your health care provider if you have a mole that is larger than the size of a pencil eraser.  Always use sunscreen. Apply sunscreen liberally and repeatedly throughout the day.  Protect yourself by wearing long sleeves, pants, a wide-brimmed hat, and sunglasses whenever you are outside. HEART DISEASE, DIABETES, AND HIGH BLOOD PRESSURE   High blood pressure causes heart disease and increases the risk of stroke. High blood pressure is more likely to develop in:  People who have blood pressure in the high end of the normal range (130-139/85-89 mm Hg).  People who are overweight or obese.  People who are African American.  If you are 47-76 years of age, have your blood pressure checked every 3-5 years. If you are 68 years of age or older, have your blood pressure checked every year. You should have your blood pressure measured twice--once when you are at a hospital or clinic,  and once when you are not at a hospital or clinic. Record the average of the two measurements. To check your blood pressure when you are not at a hospital or clinic, you can use:  An automated blood pressure machine at a pharmacy.  A home blood pressure monitor.  If you are between 56 years and 16 years old, ask your health care provider if you should take aspirin to prevent strokes.  Have regular diabetes screenings. This involves taking a blood sample to check your fasting blood sugar level.  If you are at a normal weight and have a low risk for diabetes, have this test once every three years after 65 years of age.  If you are overweight and have a high risk for diabetes, consider being tested at a younger age or more often. PREVENTING INFECTION  Hepatitis B  If you have a higher risk for hepatitis B, you should be screened for this virus. You are considered at high risk for hepatitis B if:  You were born in a country where hepatitis B is common. Ask your health care provider which countries are considered high risk.  Your parents were born in a high-risk country, and you have not been immunized against hepatitis B (hepatitis B vaccine).  You have HIV or AIDS.  You use needles to inject street drugs.  You live with someone who has hepatitis B.  You have had sex with someone who has hepatitis B.  You get hemodialysis treatment.  You take certain medicines for conditions, including cancer, organ transplantation, and autoimmune conditions. Hepatitis C  Blood testing is recommended for:  Everyone born from [birth year] through 1965.  Anyone with known risk factors for hepatitis C. Sexually transmitted infections (STIs)  You should be screened for sexually transmitted infections (STIs) including gonorrhea and chlamydia if:  You are sexually active and are younger than 65 years of age.  You are older than 65 years of age and your health care provider tells you that you are at risk  for this type of infection.  Your sexual activity has changed since you were last screened and you are at an increased risk for chlamydia or gonorrhea. Ask your health care provider if you are at risk.  If you do not have HIV, but are at risk, it may be recommended that you take a prescription medicine daily to prevent  HIV infection. This is called pre-exposure prophylaxis (PrEP). You are considered at risk if:  You are sexually active and do not regularly use condoms or know the HIV status of your partner(s).  You take drugs by injection.  You are sexually active with a partner who has HIV. Talk with your health care provider about whether you are at high risk of being infected with HIV. If you choose to begin PrEP, you should first be tested for HIV. You should then be tested every 3 months for as long as you are taking PrEP.  PREGNANCY   If you are premenopausal and you may become pregnant, ask your health care provider about preconception counseling.  If you may become pregnant, take 400 to 800 micrograms (mcg) of folic acid every day.  If you want to prevent pregnancy, talk to your health care provider about birth control (contraception). OSTEOPOROSIS AND MENOPAUSE   Osteoporosis is a disease in which the bones lose minerals and strength with aging. This can result in serious bone fractures. Your risk for osteoporosis can be identified using a bone density scan.  If you are 55 years of age or older, or if you are at risk for osteoporosis and fractures, ask your health care provider if you should be screened.  Ask your health care provider whether you should take a calcium or vitamin D supplement to lower your risk for osteoporosis.  Menopause may have certain physical symptoms and risks.  Hormone replacement therapy may reduce some of these symptoms and risks. Talk to your health care provider about whether hormone replacement therapy is right for you.  HOME CARE INSTRUCTIONS    Schedule regular health, dental, and eye exams.  Stay current with your immunizations.   Do not use any tobacco products including cigarettes, chewing tobacco, or electronic cigarettes.  If you are pregnant, do not drink alcohol.  If you are breastfeeding, limit how much and how often you drink alcohol.  Limit alcohol intake to no more than 1 drink per day for nonpregnant women. One drink equals 12 ounces of beer, 5 ounces of wine, or 1 ounces of hard liquor.  Do not use street drugs.  Do not share needles.  Ask your health care provider for help if you need support or information about quitting drugs.  Tell your health care provider if you often feel depressed.  Tell your health care provider if you have ever been abused or do not feel safe at home.   This information is not intended to replace advice given to you by your health care provider. Make sure you discuss any questions you have with your health care provider.   Document Released: 04/29/2011 Document Revised: 11/04/2014 Document Reviewed: 09/15/2013 Elsevier Interactive Patient Education [employer].

## 2016-05-17 NOTE — Progress Notes (Signed)
Pre visit review using our clinic review tool, if applicable. No additional management support is needed unless otherwise documented below in the visit note. 

## 2016-05-20 ENCOUNTER — Ambulatory Visit (INDEPENDENT_AMBULATORY_CARE_PROVIDER_SITE_OTHER)
Admission: RE | Admit: 2016-05-20 | Discharge: 2016-05-20 | Disposition: A | Discharge status: Home / Self Care | Payer: Medicare Other | Source: Ambulatory Visit | Attending: Internal Medicine | Admitting: Internal Medicine

## 2016-05-20 ENCOUNTER — Telehealth: Payer: Self-pay | Admitting: Family Medicine

## 2016-05-20 DIAGNOSIS — E2839 Other primary ovarian failure: Secondary | ICD-10-CM

## 2016-05-20 NOTE — Telephone Encounter (Signed)
Tracy Medin, MD  [name], [place]   I was unaware that immunization were not reviewed Bu cma    And she also didn't get the urinalysis that i ordered so I put in future order for urinalysis .   When labs are back please contact her ? She needs prevnar 13 and (ask her if she wants zostavax  ) And urinalysis that wasn't done at the visit .  Thanks  [facility]

## 2016-05-21 NOTE — Telephone Encounter (Signed)
Spoke to the pt.  She is not interested in a urinalysis at this time.  Believes her back pain to be musculoskeletal.  She will check with her insurance company to see if the shingles vaccine is covered in the doctors office.  Will call back to make an appt for shingles and Prevnar vaccines.

## 2016-05-22 ENCOUNTER — Ambulatory Visit
Admission: RE | Admit: 2016-05-22 | Discharge: 2016-05-22 | Disposition: A | Discharge status: Home / Self Care | Payer: Medicare Other | Source: Ambulatory Visit | Attending: Internal Medicine | Admitting: Internal Medicine

## 2016-05-22 DIAGNOSIS — Z1231 Encounter for screening mammogram for malignant neoplasm of breast: Secondary | ICD-10-CM | POA: Diagnosis not present

## 2016-06-01 ENCOUNTER — Other Ambulatory Visit: Payer: Self-pay | Admitting: Internal Medicine

## 2016-06-02 ENCOUNTER — Encounter: Payer: Self-pay | Admitting: Internal Medicine

## 2016-06-02 DIAGNOSIS — M8580 Other specified disorders of bone density and structure, unspecified site: Secondary | ICD-10-CM | POA: Insufficient documentation

## 2016-06-02 DIAGNOSIS — M810 Age-related osteoporosis without current pathological fracture: Secondary | ICD-10-CM | POA: Insufficient documentation

## 2016-06-03 NOTE — Telephone Encounter (Signed)
° °  Pt call to ask why her refill was decline    traZODone (DESYREL) 50 MG tablet

## 2016-06-04 ENCOUNTER — Encounter: Payer: Self-pay | Admitting: Internal Medicine

## 2016-06-04 MED ORDER — LISINOPRIL 5 MG PO TABS: 5.0000 mg | ORAL_TABLET | Freq: Every day | ORAL | 2 refills | Status: DC

## 2016-06-04 NOTE — Telephone Encounter (Signed)
Sent to the pharmacy by e-scribe. 

## 2016-06-05 MED ORDER — TRAZODONE HCL 50 MG PO TABS: ORAL_TABLET | ORAL | 11 refills | Status: DC

## 2016-06-05 MED ORDER — ESTRADIOL 0.1 MG/GM VA CREA: TOPICAL_CREAM | VAGINAL | 5 refills | Status: DC

## 2016-06-05 NOTE — Telephone Encounter (Signed)
Ok to erfill meds  X 1 year except  Ibuprofen refeill x 3

## 2016-06-06 DIAGNOSIS — T3695XA Adverse effect of unspecified systemic antibiotic, initial encounter: Secondary | ICD-10-CM | POA: Diagnosis not present

## 2016-06-06 DIAGNOSIS — H66002 Acute suppurative otitis media without spontaneous rupture of ear drum, left ear: Secondary | ICD-10-CM | POA: Diagnosis not present

## 2016-06-06 DIAGNOSIS — B379 Candidiasis, unspecified: Secondary | ICD-10-CM | POA: Diagnosis not present

## 2016-06-20 DIAGNOSIS — H7292 Unspecified perforation of tympanic membrane, left ear: Secondary | ICD-10-CM | POA: Diagnosis not present

## 2016-07-24 DIAGNOSIS — H900 Conductive hearing loss, bilateral: Secondary | ICD-10-CM | POA: Diagnosis not present

## 2016-07-24 DIAGNOSIS — H7192 Unspecified cholesteatoma, left ear: Secondary | ICD-10-CM | POA: Diagnosis not present

## 2016-07-24 DIAGNOSIS — H7491 Unspecified disorder of right middle ear and mastoid: Secondary | ICD-10-CM | POA: Diagnosis not present

## 2016-07-24 DIAGNOSIS — H6983 Other specified disorders of Eustachian tube, bilateral: Secondary | ICD-10-CM | POA: Diagnosis not present

## 2016-07-24 DIAGNOSIS — Q359 Cleft palate, unspecified: Secondary | ICD-10-CM | POA: Diagnosis not present

## 2016-08-05 DIAGNOSIS — H7192 Unspecified cholesteatoma, left ear: Secondary | ICD-10-CM | POA: Diagnosis not present

## 2016-08-12 DIAGNOSIS — H7491 Unspecified disorder of right middle ear and mastoid: Secondary | ICD-10-CM | POA: Diagnosis not present

## 2016-08-12 DIAGNOSIS — H6983 Other specified disorders of Eustachian tube, bilateral: Secondary | ICD-10-CM | POA: Diagnosis not present

## 2016-08-12 DIAGNOSIS — H7192 Unspecified cholesteatoma, left ear: Secondary | ICD-10-CM | POA: Diagnosis not present

## 2016-08-12 DIAGNOSIS — H900 Conductive hearing loss, bilateral: Secondary | ICD-10-CM | POA: Diagnosis not present

## 2016-10-10 DIAGNOSIS — H7102 Cholesteatoma of attic, left ear: Secondary | ICD-10-CM | POA: Diagnosis not present

## 2016-10-10 DIAGNOSIS — H7192 Unspecified cholesteatoma, left ear: Secondary | ICD-10-CM | POA: Diagnosis not present

## 2016-10-10 DIAGNOSIS — H7112 Cholesteatoma of tympanum, left ear: Secondary | ICD-10-CM | POA: Diagnosis not present

## 2016-10-10 DIAGNOSIS — H7122 Cholesteatoma of mastoid, left ear: Secondary | ICD-10-CM | POA: Diagnosis not present

## 2016-10-10 DIAGNOSIS — H9012 Conductive hearing loss, unilateral, left ear, with unrestricted hearing on the contralateral side: Secondary | ICD-10-CM | POA: Diagnosis not present

## 2016-10-10 DIAGNOSIS — H900 Conductive hearing loss, bilateral: Secondary | ICD-10-CM | POA: Diagnosis not present

## 2016-10-10 DIAGNOSIS — H7012 Chronic mastoiditis, left ear: Secondary | ICD-10-CM | POA: Diagnosis not present

## 2016-10-10 DIAGNOSIS — Z780 Asymptomatic menopausal state: Secondary | ICD-10-CM | POA: Diagnosis not present

## 2016-10-10 DIAGNOSIS — H6983 Other specified disorders of Eustachian tube, bilateral: Secondary | ICD-10-CM | POA: Diagnosis not present

## 2016-10-10 DIAGNOSIS — H7422 Discontinuity and dislocation of left ear ossicles: Secondary | ICD-10-CM | POA: Diagnosis not present

## 2016-10-10 DIAGNOSIS — H73812 Atrophic flaccid tympanic membrane, left ear: Secondary | ICD-10-CM | POA: Diagnosis not present

## 2016-10-10 DIAGNOSIS — Z79899 Other long term (current) drug therapy: Secondary | ICD-10-CM | POA: Diagnosis not present

## 2016-10-18 DIAGNOSIS — Z9889 Other specified postprocedural states: Secondary | ICD-10-CM | POA: Diagnosis not present

## 2016-11-06 DIAGNOSIS — Z9889 Other specified postprocedural states: Secondary | ICD-10-CM | POA: Diagnosis not present

## 2016-11-29 DIAGNOSIS — Z9889 Other specified postprocedural states: Secondary | ICD-10-CM | POA: Diagnosis not present

## 2017-01-24 DIAGNOSIS — H900 Conductive hearing loss, bilateral: Secondary | ICD-10-CM | POA: Diagnosis not present

## 2017-01-24 DIAGNOSIS — H6983 Other specified disorders of Eustachian tube, bilateral: Secondary | ICD-10-CM | POA: Diagnosis not present

## 2017-06-16 ENCOUNTER — Other Ambulatory Visit: Payer: Self-pay | Admitting: Emergency Medicine

## 2017-06-16 ENCOUNTER — Telehealth: Payer: Self-pay | Admitting: Internal Medicine

## 2017-06-16 ENCOUNTER — Other Ambulatory Visit: Payer: Self-pay | Admitting: Internal Medicine

## 2017-06-16 DIAGNOSIS — Z1231 Encounter for screening mammogram for malignant neoplasm of breast: Secondary | ICD-10-CM

## 2017-06-16 MED ORDER — ESTRADIOL 0.1 MG/GM VA CREA: TOPICAL_CREAM | VAGINAL | 1 refills | Status: DC

## 2017-06-16 NOTE — Telephone Encounter (Signed)
Pt request refill  estradiol (ESTRACE VAGINAL) 0.1 MG/GM vaginal cream traZODone (DESYREL) 50 MG tablet  [address] [place] - [place], [place] - [address]  Pt has made cpe for next week, wed 8/29  But is out of meds, needs 30 day

## 2017-06-16 NOTE — Telephone Encounter (Signed)
Patient prescription has been sent to the pharmacy.

## 2017-06-17 ENCOUNTER — Ambulatory Visit
Admission: RE | Admit: 2017-06-17 | Discharge: 2017-06-17 | Disposition: A | Discharge status: Home / Self Care | Payer: Medicare Other | Source: Ambulatory Visit | Attending: Internal Medicine | Admitting: Internal Medicine

## 2017-06-17 DIAGNOSIS — Z1231 Encounter for screening mammogram for malignant neoplasm of breast: Secondary | ICD-10-CM | POA: Diagnosis not present

## 2017-06-18 ENCOUNTER — Encounter: Payer: Self-pay | Admitting: Internal Medicine

## 2017-06-18 NOTE — Telephone Encounter (Signed)
Please advise if ok to refill. Thanks 

## 2017-06-18 NOTE — Telephone Encounter (Signed)
Ok to refill x 1  Due for yearly check   Have her make appt  30 minutes  For  Further refills

## 2017-06-24 NOTE — Progress Notes (Signed)
Chief Complaint  Patient presents with  . Medication Management    yearly check    HPI: Tracy Pena 66 y.o. come in for Chronic disease management  Yearly exam and assessment  Med check   Taking trazodone every night  50 mg pt help and if doesn't take then has to take at 2 am . No untoward se the next day .   Bp meds     Not checking.   takgin seems to be ok never started    Osteopenia  Last dex seems shrinking  But no pain  No fam h x   Had ear surgery .   At [facility] it out and   consideration of  Implant   But now has And hearing aids.  And seems to do well crackin in left ear . 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 Husband  Mild mi golf in heat so eating healthier  LIFESTYLE:  Exercise:   Walking  Tobacco/ETS: no Alcohol:  Minimal  Sugar beverages: no Sleep: about 8  Drug use: no   Past Medical History:  Diagnosis Date  . Decreased hearing   . H/O cholesteatoma    surgery [facility]  . Hx of varicella    as a child    Family History  Problem Relation Age of Onset  . Lung cancer Mother   . Thyroid disease Daughter        graves  . Celiac disease Grandchild   . Diabetes type I Neg Hx    graddaughter   Type 1  And    gd 66 yo  with celiac  Social History   Social History  . Marital status: Married    Spouse name: N/A  . Number of children: N/A  . Years of education: N/A   Social History Main Topics  . Smoking status: Never Smoker  . Smokeless tobacco: Never Used  . Alcohol use 4.8 oz/week    8 Glasses of wine per week  . Drug use: No  . Sexual activity: Not Asked   Other Topics Concern  . None   Social History Narrative   hh of 2    No tobacco  Few wine per weekend.    Pet dog    No ets.    Retired   Used to sleep 6-8 hours pm   Help take care of grandchild born in [place] on and off   Exercise walking.      Outpatient Medications Prior to Visit  Medication  Sig Dispense Refill  . estradiol (ESTRACE VAGINAL) 0.1 MG/GM vaginal cream USE TWO GRAMS VAGINALLY THREE TIMES WEEKLY OR AS DIRECTED 42.5 g 1  . Ibuprofen 200 MG CAPS Take 1 capsule by mouth as needed.      Marland Kitchen lisinopril (PRINIVIL,ZESTRIL) 5 MG tablet Take 1 tablet (5 mg total) by mouth daily. 90 tablet 2  . traZODone (DESYREL) 50 MG tablet TAKE HALF TO ONE TABLET BY MOUTH AT BEDTIME AS NEEDED FOR SLEEP 30 tablet 0   No facility-administered medications prior to visit.      EXAM:  BP 136/80   Pulse 65   Temp 98.1 F (36.7 C) (Oral)   Ht 5' 2.25" (1.581 m)   Wt 139 lb 9.6 oz (63.3 kg)   BMI 25.33 kg/m   Body mass index is 25.33 kg/m.  GENERAL: vitals reviewed and listed above, alert,  oriented, appears well hydrated and in no acute distress hs hearing aids  HEENT: atraumatic, conjunctiva  clear, no obvious abnormalities on inspection of external nose and ears  Left tm distorted  Some clear wetness no  Lesion or dc OP : no lesion edema or exudate  NECK: no obvious masses on inspection palpation  LUNGS: clear to auscultation bilaterally, no wheezes, rales or rhonchi, good air movement mild kyphosis  CV: HRRR, no clubbing cyanosis or  peripheral edema nl cap refill  Breast: normal by inspection . No dimpling, discharge, masses, tenderness or discharge . Abdomen:  Sof,t normal bowel sounds without hepatosplenomegaly, no guarding rebound or masses no CVA tenderness MS: moves all extremities without noticeable focal  abnormality PSYCH: pleasant and cooperative, no obvious depression or anxiety Lab Results  Component Value Date   WBC 6.8 11/07/2014   HGB 13.4 11/07/2014   HCT 40.4 11/07/2014   PLT 346.0 11/07/2014   GLUCOSE 92 05/17/2016   CHOL 215 (H) 05/17/2016   TRIG 123.0 05/17/2016   HDL 59.00 05/17/2016   LDLDIRECT 142.7 09/13/2013   LDLCALC 131 (H) 05/17/2016   ALT 16 11/07/2014   AST 20 11/07/2014   NA 140 05/17/2016   K 3.9 05/17/2016   CL 103 05/17/2016   CREATININE  1.06 05/17/2016   BUN 20 05/17/2016   CO2 31 05/17/2016   TSH 0.87 05/17/2016   BP Readings from Last 3 Encounters:  06/25/17 136/80  05/17/16 128/78  11/14/14 (!) 150/72   The 10-year ASCVD risk score Mikey Bussing DC Jr., et al., 2013) is: 7%   Values used to calculate the score:     Age: 64 years     Sex: Female     Is Non-Hispanic African American: No     Diabetic: No     Tobacco smoker: No     Systolic Blood Pressure: 811 mmHg     Is BP treated: No     HDL Cholesterol: 59 mg/dL     Total Cholesterol: 215 mg/dL  ASSESSMENT AND PLAN:  Discussed the following assessment and plan:  Insomnia, unspecified type  Medication management  Elevated blood pressure reading  Hyperlipidemia, unspecified hyperlipidemia type - ascvd 10 y risk 7  H/O cholesteatoma  Hearing aid worn  -Patient advised to return or notify health care team  if  new concerns arise. Total visit 31mns > 50% spent counseling and coordinating care as indicated in above note and in instructions to patient .   Expectant management. About BP readings and goals  . lsi   Patient Instructions   Glad you're doing well. New  guidelines are to aim for blood pressure control 120 /80 Attention to healthy lifestyle eating and exercise.  Take blood pressure readings twice a day for 7- 10 days and then periodically .   .Marland Kitchenend in readings     If not controlled consider medication .    Ok to remain on med for sleep as long as no untoward side effects .   Flu vaccine and  shigles shingrix vaccine  Advised.       Why follow it? Research shows. . Those who follow the Mediterranean diet have a reduced risk of heart disease  . The diet is associated with a reduced incidence of Parkinson's and Alzheimer's diseases . People following the diet may have longer life expectancies and lower rates of chronic diseases  . The Dietary Guidelines for Americans recommends the Mediterranean diet as an eating plan to promote health and  prevent disease  What Is the Mediterranean Diet?  . Healthy eating plan based on typical foods and recipes of Mediterranean-style cooking . The diet is primarily a plant based diet; these foods should make up a majority of meals   Starches - Plant based foods should make up a majority of meals - They are an important sources of vitamins, minerals, energy, antioxidants, and fiber - Choose whole grains, foods high in fiber and minimally processed items  - Typical grain sources include wheat, oats, barley, corn, brown rice, bulgar, farro, millet, polenta, couscous  - Various types of beans include chickpeas, lentils, fava beans, black beans, white beans   Fruits  Veggies - Large quantities of antioxidant rich fruits & veggies; 6 or more servings  - Vegetables can be eaten raw or lightly drizzled with oil and cooked  - Vegetables common to the traditional Mediterranean Diet include: artichokes, arugula, beets, broccoli, brussel sprouts, cabbage, carrots, celery, collard greens, cucumbers, eggplant, kale, leeks, lemons, lettuce, mushrooms, okra, onions, peas, peppers, potatoes, pumpkin, radishes, rutabaga, shallots, spinach, sweet potatoes, turnips, zucchini - Fruits common to the Mediterranean Diet include: apples, apricots, avocados, cherries, clementines, dates, figs, grapefruits, grapes, melons, nectarines, oranges, peaches, pears, pomegranates, strawberries, tangerines  Fats - Replace butter and margarine with healthy oils, such as olive oil, canola oil, and tahini  - Limit nuts to no more than a handful a day  - Nuts include walnuts, almonds, pecans, pistachios, pine nuts  - Limit or avoid candied, honey roasted or heavily salted nuts - Olives are central to the [employer] - can be eaten whole or used in a variety of dishes   Meats Protein - Limiting red meat: no more than a few times a month - When eating red meat: choose lean cuts and keep the portion to the size of deck of cards -  Eggs: approx. 0 to 4 times a week  - Fish and lean poultry: at least 2 a week  - Healthy protein sources include, chicken, [place], lean beef, lamb - Increase intake of seafood such as tuna, salmon, trout, mackerel, shrimp, scallops - Avoid or limit high fat processed meats such as sausage and bacon  Dairy - Include moderate amounts of low fat dairy products  - Focus on healthy dairy such as fat free yogurt, skim milk, low or reduced fat cheese - Limit dairy products higher in fat such as whole or 2% milk, cheese, ice cream  Alcohol - Moderate amounts of red wine is ok  - No more than 5 oz daily for women (all ages) and men older than age 26  - No more than 10 oz of wine daily for men younger than 46  Other - Limit sweets and other desserts  - Use herbs and spices instead of salt to flavor foods  - Herbs and spices common to the traditional Mediterranean Diet include: basil, bay leaves, chives, cloves, cumin, fennel, garlic, lavender, marjoram, mint, oregano, parsley, pepper, rosemary, sage, savory, sumac, tarragon, thyme   It's not just a diet, it's a lifestyle:  . The Mediterranean diet includes lifestyle factors typical of those in the region  . Foods, drinks and meals are best eaten with others and savored . Daily physical activity is important for overall good health . This could be strenuous exercise like running and aerobics . This could also be more leisurely activities such as walking, housework, yard-work, or taking the stairs . Moderation is the key; a balanced and healthy diet accommodates  most foods and drinks . Consider portion sizes and frequency of consumption of certain foods   Meal Ideas & Options:  . Breakfast:  o Whole wheat toast or whole wheat English muffins with peanut butter & hard boiled egg o Steel cut oats topped with apples & cinnamon and skim milk  o Fresh fruit: banana, strawberries, melon, berries, peaches  o Smoothies: strawberries, bananas, greek yogurt,  peanut butter o Low fat greek yogurt with blueberries and granola  o Egg white omelet with spinach and mushrooms o Breakfast couscous: whole wheat couscous, apricots, skim milk, cranberries  . Sandwiches:  o Hummus and grilled vegetables (peppers, zucchini, squash) on whole wheat bread   o Grilled chicken on whole wheat pita with lettuce, tomatoes, cucumbers or tzatziki  o Tuna salad on whole wheat bread: tuna salad made with greek yogurt, olives, red peppers, capers, green onions o Garlic rosemary lamb pita: lamb sauted with garlic, rosemary, salt & pepper; add lettuce, cucumber, greek yogurt to pita - flavor with lemon juice and black pepper  . Seafood:  o Mediterranean grilled salmon, seasoned with garlic, basil, parsley, lemon juice and black pepper o Shrimp, lemon, and spinach whole-grain pasta salad made with low fat greek yogurt  o Seared scallops with lemon orzo  o Seared tuna steaks seasoned salt, pepper, coriander topped with tomato mixture of olives, tomatoes, olive oil, minced garlic, parsley, green onions and cappers  . Meats:  o Herbed greek chicken salad with kalamata olives, cucumber, feta  o Red bell peppers stuffed with spinach, bulgur, lean ground beef (or lentils) & topped with feta   o Kebabs: skewers of chicken, tomatoes, onions, zucchini, squash  o [place] burgers: made with red onions, mint, dill, lemon juice, feta cheese topped with roasted red peppers . Vegetarian o Cucumber salad: cucumbers, artichoke hearts, celery, red onion, feta cheese, tossed in olive oil & lemon juice  o Hummus and whole grain pita points with a greek salad (lettuce, tomato, feta, olives, cucumbers, red onion) o Lentil soup with celery, carrots made with vegetable broth, garlic, salt and pepper  o Tabouli salad: parsley, bulgur, mint, scallions, cucumbers, tomato, radishes, lemon juice, olive oil, salt and pepper.          Preventive Care 33 Years and Older, Female Preventive care refers  to lifestyle choices and visits with your health care provider that can promote health and wellness. What does preventive care include?  A yearly physical exam. This is also called an annual well check.  Dental exams once or twice a year.  Routine eye exams. Ask your health care provider how often you should have your eyes checked.  Personal lifestyle choices, including: ? Daily care of your teeth and gums. ? Regular physical activity. ? Eating a healthy diet. ? Avoiding tobacco and drug use. ? Limiting alcohol use. ? Practicing safe sex. ? Taking low-dose aspirin every day. ? Taking vitamin and mineral supplements as recommended by your health care provider. What happens during an annual well check? The services and screenings done by your health care provider during your annual well check will depend on your age, overall health, lifestyle risk factors, and family history of disease. Counseling Your health care provider may ask you questions about your:  Alcohol use.  Tobacco use.  Drug use.  Emotional well-being.  Home and relationship well-being.  Sexual activity.  Eating habits.  History of falls.  Memory and ability to understand (cognition).  Work and work Statistician.  Reproductive health.  Screening You may have the following tests or measurements:  Height, weight, and BMI.  Blood pressure.  Lipid and cholesterol levels. These may be checked every 5 years, or more frequently if you are over 74 years old.  Skin check.  Lung cancer screening. You may have this screening every year starting at age 16 if you have a 30-pack-year history of smoking and currently smoke or have quit within the past 15 years.  Fecal occult blood test (FOBT) of the stool. You may have this test every year starting at age 64.  Flexible sigmoidoscopy or colonoscopy. You may have a sigmoidoscopy every 5 years or a colonoscopy every 10 years starting at age 33.  Hepatitis C  blood test.  Hepatitis B blood test.  Sexually transmitted disease (STD) testing.  Diabetes screening. This is done by checking your blood sugar (glucose) after you have not eaten for a while (fasting). You may have this done every 1-3 years.  Bone density scan. This is done to screen for osteoporosis. You may have this done starting at age 66.  Mammogram. This may be done every 1-2 years. Talk to your health care provider about how often you should have regular mammograms.  Talk with your health care provider about your test results, treatment options, and if necessary, the need for more tests. Vaccines Your health care provider may recommend certain vaccines, such as:  Influenza vaccine. This is recommended every year.  Tetanus, diphtheria, and acellular pertussis (Tdap, Td) vaccine. You may need a Td booster every 10 years.  Varicella vaccine. You may need this if you have not been vaccinated.  Zoster vaccine. You may need this after age 44.  Measles, mumps, and rubella (MMR) vaccine. You may need at least one dose of MMR if you were born in 1957 or later. You may also need a second dose.  Pneumococcal 13-valent conjugate (PCV13) vaccine. One dose is recommended after age 41.  Pneumococcal polysaccharide (PPSV23) vaccine. One dose is recommended after age 52.  Meningococcal vaccine. You may need this if you have certain conditions.  Hepatitis A vaccine. You may need this if you have certain conditions or if you travel or work in places where you may be exposed to hepatitis A.  Hepatitis B vaccine. You may need this if you have certain conditions or if you travel or work in places where you may be exposed to hepatitis B.  Haemophilus influenzae type b (Hib) vaccine. You may need this if you have certain conditions.  Talk to your health care provider about which screenings and vaccines you need and how often you need them. This information is not intended to replace advice  given to you by your health care provider. Make sure you discuss any questions you have with your health care provider. Document Released: 11/10/2015 Document Revised: 07/03/2016 Document Reviewed: 08/15/2015 Elsevier Interactive Patient Education  2017 [place] K. Panosh M.D.

## 2017-06-25 ENCOUNTER — Ambulatory Visit (INDEPENDENT_AMBULATORY_CARE_PROVIDER_SITE_OTHER): Payer: Medicare Other | Admitting: Internal Medicine

## 2017-06-25 ENCOUNTER — Encounter: Payer: Self-pay | Admitting: Internal Medicine

## 2017-06-25 VITALS — BP 136/80 | HR 65 | Temp 98.1°F | Ht 62.25 in | Wt 139.6 lb

## 2017-06-25 DIAGNOSIS — R030 Elevated blood-pressure reading, without diagnosis of hypertension: Secondary | ICD-10-CM | POA: Diagnosis not present

## 2017-06-25 DIAGNOSIS — G4700 Insomnia, unspecified: Secondary | ICD-10-CM

## 2017-06-25 DIAGNOSIS — E785 Hyperlipidemia, unspecified: Secondary | ICD-10-CM

## 2017-06-25 DIAGNOSIS — Z79899 Other long term (current) drug therapy: Secondary | ICD-10-CM | POA: Diagnosis not present

## 2017-06-25 DIAGNOSIS — Z8669 Personal history of other diseases of the nervous system and sense organs: Secondary | ICD-10-CM

## 2017-06-25 DIAGNOSIS — Z974 Presence of external hearing-aid: Secondary | ICD-10-CM

## 2017-06-25 MED ORDER — TRAZODONE HCL 50 MG PO TABS: 50.0000 mg | ORAL_TABLET | Freq: Every evening | ORAL | 3 refills | Status: DC | PRN

## 2017-06-25 NOTE — Patient Instructions (Addendum)
Glad you're doing well. New  guidelines are to aim for blood pressure control 120 /80 Attention to healthy lifestyle eating and exercise.  Take blood pressure readings twice a day for 7- 10 days and then periodically .   Marland KitchenSend in readings     If not controlled consider medication .    Ok to remain on med for sleep as long as no untoward side effects .   Flu vaccine and  shigles shingrix vaccine  Advised.       Why follow it? Research shows. . Those who follow the Mediterranean diet have a reduced risk of heart disease  . The diet is associated with a reduced incidence of Parkinson's and Alzheimer's diseases . People following the diet may have longer life expectancies and lower rates of chronic diseases  . The Dietary Guidelines for Americans recommends the Mediterranean diet as an eating plan to promote health and prevent disease  What Is the Mediterranean Diet?  . Healthy eating plan based on typical foods and recipes of Mediterranean-style cooking . The diet is primarily a plant based diet; these foods should make up a majority of meals   Starches - Plant based foods should make up a majority of meals - They are an important sources of vitamins, minerals, energy, antioxidants, and fiber - Choose whole grains, foods high in fiber and minimally processed items  - Typical grain sources include wheat, oats, barley, corn, brown rice, bulgar, farro, millet, polenta, couscous  - Various types of beans include chickpeas, lentils, fava beans, black beans, white beans   Fruits  Veggies - Large quantities of antioxidant rich fruits & veggies; 6 or more servings  - Vegetables can be eaten raw or lightly drizzled with oil and cooked  - Vegetables common to the traditional Mediterranean Diet include: artichokes, arugula, beets, broccoli, brussel sprouts, cabbage, carrots, celery, collard greens, cucumbers, eggplant, kale, leeks, lemons, lettuce, mushrooms, okra, onions, peas, peppers, potatoes,  pumpkin, radishes, rutabaga, shallots, spinach, sweet potatoes, turnips, zucchini - Fruits common to the Mediterranean Diet include: apples, apricots, avocados, cherries, clementines, dates, figs, grapefruits, grapes, melons, nectarines, oranges, peaches, pears, pomegranates, strawberries, tangerines  Fats - Replace butter and margarine with healthy oils, such as olive oil, canola oil, and tahini  - Limit nuts to no more than a handful a day  - Nuts include walnuts, almonds, pecans, pistachios, pine nuts  - Limit or avoid candied, honey roasted or heavily salted nuts - Olives are central to the [employer] - can be eaten whole or used in a variety of dishes   Meats Protein - Limiting red meat: no more than a few times a month - When eating red meat: choose lean cuts and keep the portion to the size of deck of cards - Eggs: approx. 0 to 4 times a week  - Fish and lean poultry: at least 2 a week  - Healthy protein sources include, chicken, [place], lean beef, lamb - Increase intake of seafood such as tuna, salmon, trout, mackerel, shrimp, scallops - Avoid or limit high fat processed meats such as sausage and bacon  Dairy - Include moderate amounts of low fat dairy products  - Focus on healthy dairy such as fat free yogurt, skim milk, low or reduced fat cheese - Limit dairy products higher in fat such as whole or 2% milk, cheese, ice cream  Alcohol - Moderate amounts of red wine is ok  - No more than 5 oz daily for women (all ages)  and men older than age 17  - No more than 10 oz of wine daily for men younger than 49  Other - Limit sweets and other desserts  - Use herbs and spices instead of salt to flavor foods  - Herbs and spices common to the traditional Mediterranean Diet include: basil, bay leaves, chives, cloves, cumin, fennel, garlic, lavender, marjoram, mint, oregano, parsley, pepper, rosemary, sage, savory, sumac, tarragon, thyme   It's not just a diet, it's a lifestyle:  . The  Mediterranean diet includes lifestyle factors typical of those in the region  . Foods, drinks and meals are best eaten with others and savored . Daily physical activity is important for overall good health . This could be strenuous exercise like running and aerobics . This could also be more leisurely activities such as walking, housework, yard-work, or taking the stairs . Moderation is the key; a balanced and healthy diet accommodates most foods and drinks . Consider portion sizes and frequency of consumption of certain foods   Meal Ideas & Options:  . Breakfast:  o Whole wheat toast or whole wheat English muffins with peanut butter & hard boiled egg o Steel cut oats topped with apples & cinnamon and skim milk  o Fresh fruit: banana, strawberries, melon, berries, peaches  o Smoothies: strawberries, bananas, greek yogurt, peanut butter o Low fat greek yogurt with blueberries and granola  o Egg white omelet with spinach and mushrooms o Breakfast couscous: whole wheat couscous, apricots, skim milk, cranberries  . Sandwiches:  o Hummus and grilled vegetables (peppers, zucchini, squash) on whole wheat bread   o Grilled chicken on whole wheat pita with lettuce, tomatoes, cucumbers or tzatziki  o Tuna salad on whole wheat bread: tuna salad made with greek yogurt, olives, red peppers, capers, green onions o Garlic rosemary lamb pita: lamb sauted with garlic, rosemary, salt & pepper; add lettuce, cucumber, greek yogurt to pita - flavor with lemon juice and black pepper  . Seafood:  o Mediterranean grilled salmon, seasoned with garlic, basil, parsley, lemon juice and black pepper o Shrimp, lemon, and spinach whole-grain pasta salad made with low fat greek yogurt  o Seared scallops with lemon orzo  o Seared tuna steaks seasoned salt, pepper, coriander topped with tomato mixture of olives, tomatoes, olive oil, minced garlic, parsley, green onions and cappers  . Meats:  o Herbed greek chicken salad  with kalamata olives, cucumber, feta  o Red bell peppers stuffed with spinach, bulgur, lean ground beef (or lentils) & topped with feta   o Kebabs: skewers of chicken, tomatoes, onions, zucchini, squash  o [place] burgers: made with red onions, mint, dill, lemon juice, feta cheese topped with roasted red peppers . Vegetarian o Cucumber salad: cucumbers, artichoke hearts, celery, red onion, feta cheese, tossed in olive oil & lemon juice  o Hummus and whole grain pita points with a greek salad (lettuce, tomato, feta, olives, cucumbers, red onion) o Lentil soup with celery, carrots made with vegetable broth, garlic, salt and pepper  o Tabouli salad: parsley, bulgur, mint, scallions, cucumbers, tomato, radishes, lemon juice, olive oil, salt and pepper.          Preventive Care 61 Years and Older, Female Preventive care refers to lifestyle choices and visits with your health care provider that can promote health and wellness. What does preventive care include?  A yearly physical exam. This is also called an annual well check.  Dental exams once or twice a year.  Routine eye exams.  Ask your health care provider how often you should have your eyes checked.  Personal lifestyle choices, including: ? Daily care of your teeth and gums. ? Regular physical activity. ? Eating a healthy diet. ? Avoiding tobacco and drug use. ? Limiting alcohol use. ? Practicing safe sex. ? Taking low-dose aspirin every day. ? Taking vitamin and mineral supplements as recommended by your health care provider. What happens during an annual well check? The services and screenings done by your health care provider during your annual well check will depend on your age, overall health, lifestyle risk factors, and family history of disease. Counseling Your health care provider may ask you questions about your:  Alcohol use.  Tobacco use.  Drug use.  Emotional well-being.  Home and relationship  well-being.  Sexual activity.  Eating habits.  History of falls.  Memory and ability to understand (cognition).  Work and work Statistician.  Reproductive health.  Screening You may have the following tests or measurements:  Height, weight, and BMI.  Blood pressure.  Lipid and cholesterol levels. These may be checked every 5 years, or more frequently if you are over 85 years old.  Skin check.  Lung cancer screening. You may have this screening every year starting at age 12 if you have a 30-pack-year history of smoking and currently smoke or have quit within the past 15 years.  Fecal occult blood test (FOBT) of the stool. You may have this test every year starting at age 32.  Flexible sigmoidoscopy or colonoscopy. You may have a sigmoidoscopy every 5 years or a colonoscopy every 10 years starting at age 68.  Hepatitis C blood test.  Hepatitis B blood test.  Sexually transmitted disease (STD) testing.  Diabetes screening. This is done by checking your blood sugar (glucose) after you have not eaten for a while (fasting). You may have this done every 1-3 years.  Bone density scan. This is done to screen for osteoporosis. You may have this done starting at age 56.  Mammogram. This may be done every 1-2 years. Talk to your health care provider about how often you should have regular mammograms.  Talk with your health care provider about your test results, treatment options, and if necessary, the need for more tests. Vaccines Your health care provider may recommend certain vaccines, such as:  Influenza vaccine. This is recommended every year.  Tetanus, diphtheria, and acellular pertussis (Tdap, Td) vaccine. You may need a Td booster every 10 years.  Varicella vaccine. You may need this if you have not been vaccinated.  Zoster vaccine. You may need this after age 22.  Measles, mumps, and rubella (MMR) vaccine. You may need at least one dose of MMR if you were born in  1957 or later. You may also need a second dose.  Pneumococcal 13-valent conjugate (PCV13) vaccine. One dose is recommended after age 63.  Pneumococcal polysaccharide (PPSV23) vaccine. One dose is recommended after age 73.  Meningococcal vaccine. You may need this if you have certain conditions.  Hepatitis A vaccine. You may need this if you have certain conditions or if you travel or work in places where you may be exposed to hepatitis A.  Hepatitis B vaccine. You may need this if you have certain conditions or if you travel or work in places where you may be exposed to hepatitis B.  Haemophilus influenzae type b (Hib) vaccine. You may need this if you have certain conditions.  Talk to your health care provider about which  screenings and vaccines you need and how often you need them. This information is not intended to replace advice given to you by your health care provider. Make sure you discuss any questions you have with your health care provider. Document Released: 11/10/2015 Document Revised: 07/03/2016 Document Reviewed: 08/15/2015 Elsevier Interactive Patient Education  2017 [employer].

## 2017-07-18 ENCOUNTER — Encounter: Payer: Self-pay | Admitting: Internal Medicine

## 2017-07-24 ENCOUNTER — Other Ambulatory Visit: Payer: Self-pay | Admitting: Internal Medicine

## 2017-07-25 NOTE — Telephone Encounter (Signed)
Pt states the pharmacy never received the Rx traZODone (DESYREL) 50 MG tablet  from 06/25/17  Would like you to resend please.

## 2018-03-17 DIAGNOSIS — L820 Inflamed seborrheic keratosis: Secondary | ICD-10-CM | POA: Diagnosis not present

## 2018-03-17 DIAGNOSIS — L814 Other melanin hyperpigmentation: Secondary | ICD-10-CM | POA: Diagnosis not present

## 2018-03-17 DIAGNOSIS — L570 Actinic keratosis: Secondary | ICD-10-CM | POA: Diagnosis not present

## 2018-03-17 DIAGNOSIS — D485 Neoplasm of uncertain behavior of skin: Secondary | ICD-10-CM | POA: Diagnosis not present

## 2018-04-28 ENCOUNTER — Other Ambulatory Visit: Payer: Self-pay | Admitting: Internal Medicine

## 2018-07-29 ENCOUNTER — Other Ambulatory Visit: Payer: Self-pay | Admitting: Internal Medicine

## 2018-07-29 DIAGNOSIS — Z1231 Encounter for screening mammogram for malignant neoplasm of breast: Secondary | ICD-10-CM

## 2018-07-30 ENCOUNTER — Ambulatory Visit
Admission: RE | Admit: 2018-07-30 | Discharge: 2018-07-30 | Disposition: A | Discharge status: Home / Self Care | Payer: Medicare Other | Source: Ambulatory Visit | Attending: Internal Medicine | Admitting: Internal Medicine

## 2018-07-30 DIAGNOSIS — Z1231 Encounter for screening mammogram for malignant neoplasm of breast: Secondary | ICD-10-CM

## 2018-08-24 NOTE — Progress Notes (Signed)
Chief Complaint  Patient presents with  . Annual Exam    aches and pains and still having issues with sleeping  . Insomnia    HPI: Tracy Pena 67 y.o. comes in today for yearly visit Since last visit. Ok but still battline sleep   Issues  Melatonin weight dreams   And trazadone causes some rls sx  .  Breat issues  nthces  sholder and intertrigo considering  Breast reduction for these long term sx  Has new puppy 1 year but she sleeps through the night  Health Maintenance  Topic Date Due  . Hepatitis C Screening  1951/06/10  . TETANUS/TDAP  10/28/2014  . PNA vac Low Risk Adult (1 of 2 - PCV13) 03/24/2016  . INFLUENZA VACCINE  01/27/2019 (Originally 05/28/2018)  . MAMMOGRAM  07/30/2020  . COLONOSCOPY  02/19/2021  . DEXA SCAN  Completed   Health Maintenance Review LIFESTYLE:  Exercise:  Walking  Tobacco/ETS: no Alcohol:  Little  Sugar beverages: no Sleep:  Still off .   Not taking  trazadone cause of rl.  Drug use: no HH: 2  Pets puppy   A lot   melatonin now .   Hearing:  2 hearing aids    Sp surgery .   Vision:  No limitations at present . Last eye check UTD  Safety:  Has smoke detector and wears seat belts.  No firearms. No excess sun exposure. Sees dentist regularly  Memory: Felt to be good  , no concern from her or her family.  Depression: No anhedonia unusual crying or depressive symptoms  Nutrition: Eats well balanced diet; adequate calcium and vitamin D. No swallowing chewing problems.  Injury: no major injuries in the last six months.  Other healthcare providers:  Reviewed today .  Social:  Lives with spouse married. No pets.   Preventive parameters: up-to-date  Reviewed   ADLS:   There are no problems or need for assistance  driving, feeding, obtaining food, dressing, toileting and bathing, managing money using phone. She is independent.    ROS:  GEN/ HEENT: No fever, significant weight changes sweats headaches vision problems hearing  changes, CV/ PULM; No chest pain shortness of breath cough, syncope,edema  change in exercise tolerance. GI /GU: No adominal pain, vomiting, change in bowel habits. No blood in the stool. No significant GU symptoms. SKIN/HEME: ,no acute skin rashes suspicious lesions or bleeding. No lymphadenopathy, nodules, masses.  NEURO/ PSYCH:  No neurologic signs such as weakness numbness. No depression anxiety. IMM/ Allergy: No unusual infections.  Allergy .   REST of 12 system review negative except as per HPI   Past Medical History:  Diagnosis Date  . Decreased hearing   . H/O cholesteatoma    surgery [facility]  . Hx of varicella    as a child    Family History  Problem Relation Age of Onset  . Lung cancer Mother   . Thyroid disease Daughter        [place]  . Celiac disease Grandchild   . Diabetes type I Neg Hx     Social History   Socioeconomic History  . Marital status: Married    Spouse name: Not on file  . Number of children: Not on file  . Years of education: Not on file  . Highest education level: Not on file  Occupational History  . Not on file  Social Needs  . Financial resource strain: Not on file  . Food insecurity:  Worry: Not on file    Inability: Not on file  . Transportation needs:    Medical: Not on file    Non-medical: Not on file  Tobacco Use  . Smoking status: Never Smoker  . Smokeless tobacco: Never Used  Substance and Sexual Activity  . Alcohol use: Yes    Alcohol/week: 8.0 standard drinks    Types: 8 Glasses of wine per week  . Drug use: No  . Sexual activity: Not on file  Lifestyle  . Physical activity:    Days per week: Not on file    Minutes per session: Not on file  . Stress: Not on file  Relationships  . Social connections:    Talks on phone: Not on file    Gets together: Not on file    Attends religious service: Not on file    Active member of club or organization: Not on file    Attends meetings of clubs or organizations: Not on file     Relationship status: Not on file  Other Topics Concern  . Not on file  Social History Narrative   hh of 2    No tobacco  Few wine per weekend.    Pet dog    No ets.    Retired   Used to sleep 6-8 hours pm   Help take care of grandchild born in [place] on and off   Exercise walking.      Outpatient Encounter Medications as of 08/25/2018  Medication Sig  . estradiol (ESTRACE VAGINAL) 0.1 MG/GM vaginal cream USE TWO GRAMS VAGINALLY THREE TIMES WEEKLY OR AS DIRECTED  . Ibuprofen 200 MG CAPS Take 1 capsule by mouth as needed.    . traZODone (DESYREL) 50 MG tablet Take 1 tablet (50 mg total) by mouth at bedtime as needed for sleep.  . traZODone (DESYREL) 50 MG tablet TAKE 1/2 TO 1 TABLET BY MOUTH AT BEDTIME AS NEEDED FOR SLEEP  . Suvorexant (BELSOMRA) 10 MG TABS Take 10 mg by mouth at bedtime as needed (sleep may increase to 20 mg as directed).   No facility-administered encounter medications on file as of 08/25/2018.     EXAM:  BP 136/84 (BP Location: Right Arm, Patient Position: Sitting, Cuff Size: Normal)   Pulse 66   Temp 98.1 F (36.7 C) (Oral)   Ht 5\' 2"  (1.575 m)   Wt 149 lb 2 oz (67.6 kg)   SpO2 97%   BMI 27.28 kg/m   Body mass index is 27.28 kg/m.  Physical Exam: Vital signs reviewed QMV:HQIO is a well-developed well-nourished alert cooperative   who appears stated age in no acute distress.  HEENT: normocephalic atraumatic , Eyes: PERRL EOM's full, conjunctiva clear, Nares: paten,t no deformity discharge or tenderness., ears nNE  Aids  Mouth: clear OP, no lesions, edema.  Moist mucous membranes. Dentition in adequate repair. NECK: supple without masses, thyromegaly or bruits. CHEST/PULM:  Clear to auscultation and percussion breath sounds equal no wheeze , rales or rhonchi. No chest wall deformities or tenderness.Breast: normal by inspection . No dimpling, discharge, masses, tenderness or discharge . CV: PMI is nondisplaced, S1 S2 no gallops, murmurs, rubs. Peripheral  pulses are full without delay.No JVD .  ABDOMEN: Bowel sounds normal nontender  No guard or rebound, no hepato splenomegal no CVA tenderness.   Extremtities:  No clubbing cyanosis or edema, no acute joint swelling or redness no focal atrophy NEURO:  Oriented x3, cranial nerves 3-12 appear to be intact, no obvious  focal weakness,gait within normal limits no abnormal reflexes or asymmetrical SKIN: No acute rashes normal turgor, color, no bruising or petechiae. PSYCH: Oriented, good eye contact, no obvious depression anxiety, cognition and judgment appear normal. LN: no cervical axillary i adenopathy No noted deficits in memory, attention, and speech.   Lab Results  Component Value Date   WBC 6.8 11/07/2014   HGB 13.4 11/07/2014   HCT 40.4 11/07/2014   PLT 346.0 11/07/2014   GLUCOSE 92 05/17/2016   CHOL 215 (H) 05/17/2016   TRIG 123.0 05/17/2016   HDL 59.00 05/17/2016   LDLDIRECT 142.7 09/13/2013   LDLCALC 131 (H) 05/17/2016   ALT 16 11/07/2014   AST 20 11/07/2014   NA 140 05/17/2016   K 3.9 05/17/2016   CL 103 05/17/2016   CREATININE 1.06 05/17/2016   BUN 20 05/17/2016   CO2 31 05/17/2016   TSH 0.87 05/17/2016    ASSESSMENT AND PLAN:  Discussed the following assessment and plan:  Insomnia, unspecified type  Medication management  Hyperlipidemia, unspecified hyperlipidemia type  H/O cholesteatoma  surgery and hearing aids  utd td  7 years ago   cbd melatonin   Some help .  Trial different  Sleep med   Risk benefit of medication discussed.  Total visit 63mins > 50% spent counseling and coordinating care as indicated in above note and in instructions to patient .   Patient Care Team: Liese Dizdarevic, Standley Brooking, MD as PCP - General Olevia Perches, Lowella Bandy, MD (Inactive) (Gastroenterology) Benjiman Core, MD as Referring Physician (Otolaryngology)  Patient Instructions  Continue  Sleep hygiene  Can try belsomra   10 per night and inc to 20  .  Instead of trazadone.    Continue lifestyle  intervention healthy eating and exercise .  Call for refills      Standley Brooking. Roshard Rezabek M.D.

## 2018-08-25 ENCOUNTER — Ambulatory Visit (INDEPENDENT_AMBULATORY_CARE_PROVIDER_SITE_OTHER): Payer: Medicare Other | Admitting: Internal Medicine

## 2018-08-25 ENCOUNTER — Encounter: Payer: Self-pay | Admitting: Internal Medicine

## 2018-08-25 VITALS — BP 136/84 | HR 66 | Temp 98.1°F | Ht 62.0 in | Wt 149.1 lb

## 2018-08-25 DIAGNOSIS — E785 Hyperlipidemia, unspecified: Secondary | ICD-10-CM

## 2018-08-25 DIAGNOSIS — Z8669 Personal history of other diseases of the nervous system and sense organs: Secondary | ICD-10-CM

## 2018-08-25 DIAGNOSIS — Z79899 Other long term (current) drug therapy: Secondary | ICD-10-CM

## 2018-08-25 DIAGNOSIS — G4700 Insomnia, unspecified: Secondary | ICD-10-CM | POA: Diagnosis not present

## 2018-08-25 MED ORDER — SUVOREXANT 10 MG PO TABS: 10.0000 mg | ORAL_TABLET | Freq: Every evening | ORAL | 0 refills | Status: DC | PRN

## 2018-08-25 NOTE — Patient Instructions (Addendum)
Continue  Sleep hygiene  Can try belsomra   10 per night and inc to 20  .  Instead of trazadone.    Continue lifestyle intervention healthy eating and exercise .  Call for refills

## 2018-09-10 ENCOUNTER — Other Ambulatory Visit: Payer: Self-pay | Admitting: Internal Medicine

## 2018-09-11 NOTE — Telephone Encounter (Signed)
Per last OV notes 08/25/18 Instructions  Return in about 4 months (around 12/26/2018) for medication depending how you are doing.  Continue  Sleep hygiene  Can try belsomra   10 per night and inc to 20  .  Instead of trazadone.       Pt was given Rx same for #20 Belsomra 10mg   ,Please advise Dr Regis Bill, thanks.

## 2018-09-11 NOTE — Telephone Encounter (Signed)
Ask paitne if she is taking 20 mg per 10 mg  And we can send in enough for 30 days

## 2018-09-14 NOTE — Telephone Encounter (Signed)
Patient is calling and states she is taking 10mg . She states she found that taking 10mg  instead of 2 10mg  works for her.

## 2018-09-18 ENCOUNTER — Telehealth: Payer: Self-pay | Admitting: Internal Medicine

## 2018-09-18 NOTE — Telephone Encounter (Signed)
Patient is calling to check on the status of her medication refill. Please advise

## 2018-09-18 NOTE — Telephone Encounter (Signed)
Medication previously sent to [facility] on 09/18/18.

## 2018-09-18 NOTE — Telephone Encounter (Signed)
Copied from [place] [id number]. Topic: General - Other >> Sep 18, 2018  5:13 PM Keene Breath wrote: Reason for CRM: Dawn from [facility] called to inform the doctor that the patient's script for BELSOMRA 10 MG TABS is not available at this pharmacy until Monday.  Pharmacy would like to know if the doctor could send in another script to another pharmacy([facility], [place] - [place], [place] - [address], [phone] (Phone) [phone] (Fax), so that patient would not be without the medication for the weekend.  Please advise and CB [facility] at [phone]

## 2018-09-18 NOTE — Telephone Encounter (Signed)
Sent in electronically .  ( seems like it took 7 days to get the  Answer  To my clarification)

## 2018-09-18 NOTE — Telephone Encounter (Signed)
10 mg daily

## 2018-10-13 NOTE — Telephone Encounter (Signed)
Please advise Dr Panosh, thanks.   

## 2018-10-19 NOTE — Telephone Encounter (Signed)
Before you give up on Belsomra can you try 20 mg ( take 2 10 mg if  You have some ) for at least 3-5 days   If not helping then refill the  trazodone

## 2018-11-05 NOTE — Telephone Encounter (Signed)
See message  Answered   Please send her message and refill trazodone

## 2018-11-05 NOTE — Telephone Encounter (Signed)
Cost of medication belsomra  20 mg  doesn't make sense if it is covered . By your insurance plan .   I agree dont pay that much for that medication  The patient that are taking this medication  Usually pay something like 40 $  You can ask you pharmacist if there is some mistake  With this .   I  Otherwise    Refill the  trazadone

## 2018-11-06 ENCOUNTER — Other Ambulatory Visit: Payer: Self-pay | Admitting: Internal Medicine

## 2018-11-06 ENCOUNTER — Telehealth: Payer: Self-pay

## 2018-11-06 MED ORDER — TRAZODONE HCL 50 MG PO TABS: 25.0000 mg | ORAL_TABLET | Freq: Every evening | ORAL | 0 refills | Status: DC | PRN

## 2018-11-06 NOTE — Telephone Encounter (Signed)
OK with me.

## 2018-11-06 NOTE — Telephone Encounter (Signed)
Ok with me 

## 2018-11-06 NOTE — Telephone Encounter (Signed)
Dr. Regis Bill - Please advise on pt's request to transfer care. Thanks!

## 2018-11-06 NOTE — Telephone Encounter (Signed)
Copied from [place] [id number]. Topic: Quick Communication - Rx Refill/Question >> Nov 06, 2018  1:26 PM Burchel, Abbi R wrote: Medication: traZODone (DESYREL) 50 MG tablet   Preferred Pharmacy: [facility], [place] - [place], [place] - [address] [place] [address] Phone: [phone] Fax: [phone]    Pt was  advised that RX refills may take up to 3 business days. We ask that you follow-up with your pharmacy.

## 2018-11-06 NOTE — Telephone Encounter (Signed)
Copied from [place] [id number]. Topic: Appointment Scheduling - Transfer of Care >> Nov 06, 2018  1:21 PM Burchel, Abbi R wrote: Pt would like to transfer care from Dr Regis Bill to Dr Isaac Bliss. Please schedule if approved.   [phone]

## 2018-11-06 NOTE — Telephone Encounter (Signed)
Dr. Jerilee Hoh - Please advise on pt's request to transfer care to you. Thanks!

## 2018-11-07 ENCOUNTER — Other Ambulatory Visit: Payer: Self-pay | Admitting: Internal Medicine

## 2018-11-09 ENCOUNTER — Other Ambulatory Visit: Payer: Self-pay

## 2018-11-09 NOTE — Telephone Encounter (Signed)
Please advise  Last filled: Last OV :

## 2018-11-17 NOTE — Telephone Encounter (Signed)
TOC has been approved. Pt can schedule TOC visit with Jerilee Hoh.  LMTCB

## 2019-02-02 ENCOUNTER — Other Ambulatory Visit: Payer: Self-pay | Admitting: Internal Medicine

## 2019-02-02 MED ORDER — TRAZODONE HCL 50 MG PO TABS: 25.0000 mg | ORAL_TABLET | Freq: Every evening | ORAL | 0 refills | Status: DC | PRN

## 2019-02-04 ENCOUNTER — Other Ambulatory Visit: Payer: Self-pay | Admitting: Internal Medicine

## 2019-02-08 NOTE — Telephone Encounter (Signed)
Patient has planned transfer or care  To dr Mauri Brooklyn However   If  Dr Mauri Brooklyn  is not ready to take over rx    Please refill medication x 1 . Please have her set up appt with dr Jerilee Hoh

## 2019-04-29 ENCOUNTER — Other Ambulatory Visit: Payer: Self-pay | Admitting: Internal Medicine

## 2019-04-29 NOTE — Telephone Encounter (Signed)
pcp-please advise

## 2019-05-13 ENCOUNTER — Other Ambulatory Visit: Payer: Self-pay | Admitting: Internal Medicine

## 2019-05-13 ENCOUNTER — Ambulatory Visit (INDEPENDENT_AMBULATORY_CARE_PROVIDER_SITE_OTHER): Payer: Medicare Other | Admitting: Internal Medicine

## 2019-05-13 ENCOUNTER — Other Ambulatory Visit: Payer: Self-pay

## 2019-05-13 DIAGNOSIS — N62 Hypertrophy of breast: Secondary | ICD-10-CM | POA: Diagnosis not present

## 2019-05-13 DIAGNOSIS — M8580 Other specified disorders of bone density and structure, unspecified site: Secondary | ICD-10-CM

## 2019-05-13 DIAGNOSIS — G4700 Insomnia, unspecified: Secondary | ICD-10-CM

## 2019-05-13 MED ORDER — TRAZODONE HCL 50 MG PO TABS: 25.0000 mg | ORAL_TABLET | Freq: Every evening | ORAL | 0 refills | Status: DC | PRN

## 2019-05-13 NOTE — Addendum Note (Signed)
Addended by: Lelon Frohlich Y on: 05/13/2019 09:30 AM   Modules accepted: Orders, Level of Service

## 2019-05-13 NOTE — Progress Notes (Addendum)
Virtual Visit via Telephone Note  I connected with Tracy Pena on 05/13/19 at  8:00 AM EDT by telephone and verified that I am speaking with the correct person using two identifiers.   I discussed the limitations, risks, security and privacy concerns of performing an evaluation and management service by telephone and the availability of in person appointments. I also discussed with the patient that there may be a patient responsible charge related to this service. The patient expressed understanding and agreed to proceed.  Location patient: home Location provider: work office Participants present for the call: patient, provider Patient did not have a visit in the prior 7 days to address this/these issue(s).   History of Present Illness:    She has scheduled this visit to establish care and to discuss chronic conditions.  Her past medical history significant for:  1.  Insomnia for which she takes 50 mg of trazodone at nighttime.  With that she gets about 8 to 9 hours of sleep.  2.  Osteopenia per DEXA scan in 2017.  3.  She has large breasts and is considering breast reduction surgery and would like referral to plastic surgery.  She is married, has 3 grown children and 5 grandchildren.  She is a never smoker, drinks alcohol only socially.  Family history significant for a mother who passed with lung cancer but she was a heavy smoker.  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  Observations/Objective: Patient sounds cheerful and well on the phone. I do not appreciate any increased work of breathing. Speech and thought processing are grossly intact. Patient reported vitals: None reported   Current Outpatient Medications:  .  BELSOMRA 10 MG TABS, TAKE 1 TABLET BY MOUTH AT BEDTIME AS NEEDED FOR SLEEP. MAY INCREASE TO 20mg  AS DIRECTED, Disp: 30 tablet, Rfl: 3 .  estradiol (ESTRACE  VAGINAL) 0.1 MG/GM vaginal cream, USE TWO GRAMS VAGINALLY THREE TIMES WEEKLY OR AS DIRECTED, Disp: 42.5 g, Rfl: 1 .  Ibuprofen 200 MG CAPS, Take 1 capsule by mouth as needed.  , Disp: , Rfl:  .  traZODone (DESYREL) 50 MG tablet, Take 0.5-1 tablets (25-50 mg total) by mouth at bedtime as needed. for sleep, Disp: 90 tablet, Rfl: 0  Review of Systems:  Constitutional: Denies fever, chills, diaphoresis, appetite change and fatigue.  HEENT: Denies photophobia, eye pain, redness, hearing loss, ear pain, congestion, sore throat, rhinorrhea, sneezing, mouth sores, trouble swallowing, neck pain, neck stiffness and tinnitus.   Respiratory: Denies SOB, DOE, cough, chest tightness,  and wheezing.   Cardiovascular: Denies chest pain, palpitations and leg swelling.  Gastrointestinal: Denies nausea, vomiting, abdominal pain, diarrhea, constipation, blood in stool and abdominal distention.  Genitourinary: Denies dysuria, urgency, frequency, hematuria, flank pain and difficulty urinating.  Endocrine: Denies: hot or cold intolerance, sweats, changes in hair or nails, polyuria, polydipsia. Musculoskeletal: Denies myalgias, back pain, joint swelling, arthralgias and gait problem.  Skin: Denies pallor, rash and wound.  Neurological: Denies dizziness, seizures, syncope, weakness, light-headedness, numbness and headaches.  Hematological: Denies adenopathy. Easy bruising, personal or family bleeding history  Psychiatric/Behavioral: Denies suicidal ideation, mood changes, confusion, nervousness, sleep disturbance and agitation   Assessment and Plan:  Osteopenia, unspecified location -Have recommended at least 1200 mg of calcium and 800 IUs of vitamin D daily. -Will check vitamin D levels when she returns for CPE.  Insomnia, unspecified type  -Refilled trazodone  today.  Large breasts - Plan: Ambulatory referral to Plastic Surgery per patient request.    I discussed the assessment and treatment plan with the  patient. The patient was provided an opportunity to ask questions and all were answered. The patient agreed with the plan and demonstrated an understanding of the instructions.   The patient was advised to call back or seek an in-person evaluation if the symptoms worsen or if the condition fails to improve as anticipated.  I provided 22 minutes of non-face-to-face time during this encounter.   Lelon Frohlich, MD [facility]

## 2019-05-14 ENCOUNTER — Other Ambulatory Visit: Payer: Self-pay | Admitting: Internal Medicine

## 2019-05-14 DIAGNOSIS — G4700 Insomnia, unspecified: Secondary | ICD-10-CM

## 2019-05-21 ENCOUNTER — Other Ambulatory Visit (HOSPITAL_COMMUNITY)
Admission: RE | Admit: 2019-05-21 | Discharge: 2019-05-21 | Disposition: A | Discharge status: Home / Self Care | Payer: Medicare Other | Source: Ambulatory Visit | Attending: Internal Medicine | Admitting: Internal Medicine

## 2019-05-21 ENCOUNTER — Ambulatory Visit (INDEPENDENT_AMBULATORY_CARE_PROVIDER_SITE_OTHER): Payer: Medicare Other | Admitting: Internal Medicine

## 2019-05-21 ENCOUNTER — Encounter: Payer: Self-pay | Admitting: Internal Medicine

## 2019-05-21 ENCOUNTER — Other Ambulatory Visit: Payer: Self-pay

## 2019-05-21 VITALS — BP 124/80 | HR 80 | Temp 98.1°F | Ht 63.25 in | Wt 148.8 lb

## 2019-05-21 DIAGNOSIS — M8580 Other specified disorders of bone density and structure, unspecified site: Secondary | ICD-10-CM | POA: Diagnosis not present

## 2019-05-21 DIAGNOSIS — Z780 Asymptomatic menopausal state: Secondary | ICD-10-CM | POA: Insufficient documentation

## 2019-05-21 DIAGNOSIS — E785 Hyperlipidemia, unspecified: Secondary | ICD-10-CM

## 2019-05-21 DIAGNOSIS — Z23 Encounter for immunization: Secondary | ICD-10-CM | POA: Diagnosis not present

## 2019-05-21 DIAGNOSIS — R5383 Other fatigue: Secondary | ICD-10-CM

## 2019-05-21 DIAGNOSIS — Z1151 Encounter for screening for human papillomavirus (HPV): Secondary | ICD-10-CM | POA: Diagnosis not present

## 2019-05-21 DIAGNOSIS — Z0000 Encounter for general adult medical examination without abnormal findings: Secondary | ICD-10-CM

## 2019-05-21 DIAGNOSIS — Z13820 Encounter for screening for osteoporosis: Secondary | ICD-10-CM

## 2019-05-21 DIAGNOSIS — E559 Vitamin D deficiency, unspecified: Secondary | ICD-10-CM

## 2019-05-21 DIAGNOSIS — G4700 Insomnia, unspecified: Secondary | ICD-10-CM

## 2019-05-21 LAB — TSH: TSH: 0.94 u[IU]/mL (ref 0.35–4.50)

## 2019-05-21 LAB — LDL CHOLESTEROL, DIRECT: Direct LDL: 121 mg/dL

## 2019-05-21 LAB — CBC WITH DIFFERENTIAL/PLATELET
Basophils Absolute: 0 10*3/uL (ref 0.0–0.1)
Basophils Relative: 0.8 % (ref 0.0–3.0)
Eosinophils Absolute: 0.3 10*3/uL (ref 0.0–0.7)
Eosinophils Relative: 5.6 % — ABNORMAL HIGH (ref 0.0–5.0)
HCT: 40 % (ref 36.0–46.0)
Hemoglobin: 13.3 g/dL (ref 12.0–15.0)
Lymphocytes Relative: 38.4 % (ref 12.0–46.0)
Lymphs Abs: 1.8 10*3/uL (ref 0.7–4.0)
MCHC: 33.3 g/dL (ref 30.0–36.0)
MCV: 91.1 fl (ref 78.0–100.0)
Monocytes Absolute: 0.3 10*3/uL (ref 0.1–1.0)
Monocytes Relative: 7 % (ref 3.0–12.0)
Neutro Abs: 2.3 10*3/uL (ref 1.4–7.7)
Neutrophils Relative %: 48.2 % (ref 43.0–77.0)
Platelets: 272 10*3/uL (ref 150.0–400.0)
RBC: 4.38 Mil/uL (ref 3.87–5.11)
RDW: 13.8 % (ref 11.5–15.5)
WBC: 4.7 10*3/uL (ref 4.0–10.5)

## 2019-05-21 LAB — COMPREHENSIVE METABOLIC PANEL
ALT: 15 U/L (ref 0–35)
AST: 20 U/L (ref 0–37)
Albumin: 4.5 g/dL (ref 3.5–5.2)
Alkaline Phosphatase: 68 U/L (ref 39–117)
BUN: 23 mg/dL (ref 6–23)
CO2: 28 mEq/L (ref 19–32)
Calcium: 9.9 mg/dL (ref 8.4–10.5)
Chloride: 104 mEq/L (ref 96–112)
Creatinine, Ser: 1.05 mg/dL (ref 0.40–1.20)
GFR: 52.09 mL/min — ABNORMAL LOW (ref >60.00)
Glucose, Bld: 90 mg/dL (ref 70–99)
Potassium: 4.8 mEq/L (ref 3.5–5.1)
Sodium: 140 mEq/L (ref 135–145)
Total Bilirubin: 0.3 mg/dL (ref 0.2–1.2)
Total Protein: 6.4 g/dL (ref 6.0–8.3)

## 2019-05-21 LAB — LIPID PANEL
Cholesterol: 222 mg/dL — ABNORMAL HIGH (ref 0–200)
HDL: 59.3 mg/dL (ref >39.00)
NonHDL: 163.19
Total CHOL/HDL Ratio: 4
Triglycerides: 248 mg/dL — ABNORMAL HIGH (ref 0.0–149.0)
VLDL: 49.6 mg/dL — ABNORMAL HIGH (ref 0.0–40.0)

## 2019-05-21 LAB — VITAMIN D 25 HYDROXY (VIT D DEFICIENCY, FRACTURES): VITD: 24.74 ng/mL — ABNORMAL LOW (ref 30.00–100.00)

## 2019-05-21 NOTE — Progress Notes (Signed)
Established Patient Office Visit     CC/Reason for Visit: Annual preventive exam and subsequent Medicare wellness visit  HPI: Tracy Pena is a 68 y.o. female who is coming in today for the above mentioned reasons. Past Medical History is significant for: Insomnia for which she takes 50 mg of trazodone, osteopenia per DEXA in 2017.  She is otherwise healthy, she has routine eye and dental care.  She is due for pneumonia and shingles vaccination.  States she had a tetanus vaccine about 7 years ago when she needed to have stitches after an injury.  She is due for Pap smear, had colonoscopy in 2012, mammogram in October 2019.  No acute complaints today.   Past Medical/Surgical History: Past Medical History:  Diagnosis Date  . Decreased hearing   . H/O cholesteatoma    surgery [facility]  . Hx of varicella    as a child    Past Surgical History:  Procedure Laterality Date  . INNER EAR SURGERY     at [facility] for Cholesteatoma artificial ear drum  . REPEAT CESAREAN SECTION     had 3    Social History:  reports that she has never smoked. She has never used smokeless tobacco. She reports current alcohol use of about 8.0 standard drinks of alcohol per week. She reports that she does not use drugs.  Allergies: No Known Allergies  Family History:  Family History  Problem Relation Age of Onset  . Lung cancer Mother   . Thyroid disease Daughter        graves  . Celiac disease Grandchild   . Diabetes type I Neg Hx      Current Outpatient Medications:  .  Ibuprofen 200 MG CAPS, Take 1 capsule by mouth as needed.  , Disp: , Rfl:  .  traZODone (DESYREL) 50 MG tablet, Take 0.5-1 tablets (25-50 mg total) by mouth at bedtime as needed. for sleep, Disp: 90 tablet, Rfl: 0  Review of Systems:  Constitutional: Denies fever, chills, diaphoresis, appetite change and fatigue.  HEENT: Denies photophobia, eye pain, redness, hearing loss, ear pain, congestion, sore throat, rhinorrhea,  sneezing, mouth sores, trouble swallowing, neck pain, neck stiffness and tinnitus.   Respiratory: Denies SOB, DOE, cough, chest tightness,  and wheezing.   Cardiovascular: Denies chest pain, palpitations and leg swelling.  Gastrointestinal: Denies nausea, vomiting, abdominal pain, diarrhea, constipation, blood in stool and abdominal distention.  Genitourinary: Denies dysuria, urgency, frequency, hematuria, flank pain and difficulty urinating.  Endocrine: Denies: hot or cold intolerance, sweats, changes in hair or nails, polyuria, polydipsia. Musculoskeletal: Denies myalgias, back pain, joint swelling, arthralgias and gait problem.  Skin: Denies pallor, rash and wound.  Neurological: Denies dizziness, seizures, syncope, weakness, light-headedness, numbness and headaches.  Hematological: Denies adenopathy. Easy bruising, personal or family bleeding history  Psychiatric/Behavioral: Denies suicidal ideation, mood changes, confusion, nervousness, sleep disturbance and agitation    Physical Exam: Vitals:   05/21/19 1042  BP: 124/80  Pulse: 80  Temp: 98.1 F (36.7 C)  TempSrc: Oral  SpO2: 96%  Weight: 148 lb 12.8 oz (67.5 kg)  Height: 5' 3.25" (1.607 m)    Body mass index is 26.15 kg/m.   Constitutional: NAD, calm, comfortable Eyes: PERRL, lids and conjunctivae normal ENMT: Mucous membranes are moist. Posterior pharynx clear of any exudate or lesions. Normal dentition. Tympanic membrane is pearly white, no erythema or bulging. Neck: normal, supple, no masses, no thyromegaly Respiratory: clear to auscultation bilaterally, no wheezing, no crackles.  Normal respiratory effort. No accessory muscle use.  Cardiovascular: Regular rate and rhythm, no murmurs / rubs / gallops. No extremity edema. 2+ pedal pulses. No carotid bruits.  Abdomen: no tenderness, no masses palpated. No hepatosplenomegaly. Bowel sounds positive.  Musculoskeletal: no clubbing / cyanosis. No joint deformity upper and  lower extremities. Good ROM, no contractures. Normal muscle tone.  Skin: no rashes, lesions, ulcers. No induration Neurologic: CN 2-12 grossly intact. Sensation intact, DTR normal. Strength 5/5 in all 4.  Psychiatric: Normal judgment and insight. Alert and oriented x 3. Normal mood.   Subsequent Medicare wellness visit   1. Risk factors, based on past  M,S,F -coronary artery disease risk factors: None   2.  Physical activities: She remains physically active, cares for her grandchildren and walks   3.  Depression/mood:  Stable, not depressed   4.  Hearing:  She wears bilateral hearing aids   5.  ADL's: Independent in all ADLs   6.  Fall risk:  Low   7.  Home safety: No problems identified   8.  Height weight, and visual acuity: Height and weight per chart, visual acuity is 20/25 with each eye independently and 20/20 together   9.  Counseling:  Obtain shingles vaccination at pharmacy, update cancer screening, screening labs today   10. Lab orders based on risk factors: Laboratory update will be reviewed   11. Referral :  None today   12. Care plan:  Follow-up with me in 6 months   13. Cognitive assessment:  No cognitive impairment   14. Screening: Patient provided with a written and personalized 5-10 year screening schedule in the AVS.   yes   15. Provider List Update:   PCP, ophthalmologist (unknown provider)  16. Advance Directives: Full code     Office Visit from 05/21/2019 in [place] at [place]  PHQ-9 Total Score  1      Fall Risk  05/21/2019 08/25/2018 06/25/2017 05/17/2016 09/20/2013  Falls in the past year? 0 No No No No  Number falls in past yr: 0 - - - -  Injury with Fall? 0 - - - -     Impression and Plan:  Encounter for preventive health examination  -She has routine eye and dental care. -Will receive Pneumovax today, will obtain shingles vaccination at pharmacy.  Otherwise vaccinations are up-to-date. -Screening labs will be done today.  -Healthy lifestyle has been discussed in detail. -Colonoscopy in 2012 and she is a 10-year callback. -Mammogram in October 2019 was negative, repeat this year. -Due for Pap smear which was obtained at today's office visit.  Hyperlipidemia, unspecified hyperlipidemia type  -Last LDL was 131 in 2017, she is not on medications, recheck lipids today.  Insomnia, unspecified type  -Continue trazodone, have suggested a weaning schedule.     Patient Instructions  -Nice meeting you today!!  -Lab work today; will notify you once results are available.  -Try and wean down the trazodone: start taking every other night if possible.   Preventive Care 73 Years and Older, Female Preventive care refers to lifestyle choices and visits with your health care provider that can promote health and wellness. This includes:  A yearly physical exam. This is also called an annual well check.  Regular dental and eye exams.  Immunizations.  Screening for certain conditions.  Healthy lifestyle choices, such as diet and exercise. What can I expect for my preventive care visit? Physical exam Your health care provider will check:  Height and weight.  These may be used to calculate body mass index (BMI), which is a measurement that tells if you are at a healthy weight.  Heart rate and blood pressure.  Your skin for abnormal spots. Counseling Your health care provider may ask you questions about:  Alcohol, tobacco, and drug use.  Emotional well-being.  Home and relationship well-being.  Sexual activity.  Eating habits.  History of falls.  Memory and ability to understand (cognition).  Work and work Statistician.  Pregnancy and menstrual history. What immunizations do I need?  Influenza (flu) vaccine  This is recommended every year. Tetanus, diphtheria, and pertussis (Tdap) vaccine  You may need a Td booster every 10 years. Varicella (chickenpox) vaccine  You may need this vaccine  if you have not already been vaccinated. Zoster (shingles) vaccine  You may need this after age 39. Pneumococcal conjugate (PCV13) vaccine  One dose is recommended after age 85. Pneumococcal polysaccharide (PPSV23) vaccine  One dose is recommended after age 4. Measles, mumps, and rubella (MMR) vaccine  You may need at least one dose of MMR if you were born in 1957 or later. You may also need a second dose. Meningococcal conjugate (MenACWY) vaccine  You may need this if you have certain conditions. Hepatitis A vaccine  You may need this if you have certain conditions or if you travel or work in places where you may be exposed to hepatitis A. Hepatitis B vaccine  You may need this if you have certain conditions or if you travel or work in places where you may be exposed to hepatitis B. Haemophilus influenzae type b (Hib) vaccine  You may need this if you have certain conditions. You may receive vaccines as individual doses or as more than one vaccine together in one shot (combination vaccines). Talk with your health care provider about the risks and benefits of combination vaccines. What tests do I need? Blood tests  Lipid and cholesterol levels. These may be checked every 5 years, or more frequently depending on your overall health.  Hepatitis C test.  Hepatitis B test. Screening  Lung cancer screening. You may have this screening every year starting at age 45 if you have a 30-pack-year history of smoking and currently smoke or have quit within the past 15 years.  Colorectal cancer screening. All adults should have this screening starting at age 91 and continuing until age 86. Your health care provider may recommend screening at age 68 if you are at increased risk. You will have tests every 1-10 years, depending on your results and the type of screening test.  Diabetes screening. This is done by checking your blood sugar (glucose) after you have not eaten for a while  (fasting). You may have this done every 1-3 years.  Mammogram. This may be done every 1-2 years. Talk with your health care provider about how often you should have regular mammograms.  BRCA-related cancer screening. This may be done if you have a family history of breast, ovarian, tubal, or peritoneal cancers. Other tests  Sexually transmitted disease (STD) testing.  Bone density scan. This is done to screen for osteoporosis. You may have this done starting at age 47. Follow these instructions at home: Eating and drinking  Eat a diet that includes fresh fruits and vegetables, whole grains, lean protein, and low-fat dairy products. Limit your intake of foods with high amounts of sugar, saturated fats, and salt.  Take vitamin and mineral supplements as recommended by your health care provider.  Do  not drink alcohol if your health care provider tells you not to drink.  If you drink alcohol: ? Limit how much you have to 0-1 drink a day. ? Be aware of how much alcohol is in your drink. In the [place] one drink equals one 12 oz bottle of beer (355 mL), one 5 oz glass of wine (148 mL), or one 1 oz glass of hard liquor (44 mL). Lifestyle  Take daily care of your teeth and gums.  Stay active. Exercise for at least 30 minutes on 5 or more days each week.  Do not use any products that contain nicotine or tobacco, such as cigarettes, e-cigarettes, and chewing tobacco. If you need help quitting, ask your health care provider.  If you are sexually active, practice safe sex. Use a condom or other form of protection in order to prevent STIs (sexually transmitted infections).  Talk with your health care provider about taking a low-dose aspirin or statin. What's next?  Go to your health care provider once a year for a well check visit.  Ask your health care provider how often you should have your eyes and teeth checked.  Stay up to date on all vaccines. This information is not intended to  replace advice given to you by your health care provider. Make sure you discuss any questions you have with your health care provider. Document Released: 11/10/2015 Document Revised: 10/08/2018 Document Reviewed: 10/08/2018 Elsevier Patient Education  2020 [place], MD [facility]

## 2019-05-21 NOTE — Addendum Note (Signed)
Addended by: Diona Foley on: 05/21/2019 12:21 PM   Modules accepted: Orders

## 2019-05-21 NOTE — Addendum Note (Signed)
Addended by: Westley Hummer B on: 05/21/2019 02:31 PM   Modules accepted: Orders

## 2019-05-21 NOTE — Patient Instructions (Signed)
-Nice meeting you today!!  -Lab work today; will notify you once results are available.  -Try and wean down the trazodone: start taking every other night if possible.   Preventive Care 49 Years and Older, Female Preventive care refers to lifestyle choices and visits with your health care provider that can promote health and wellness. This includes:  A yearly physical exam. This is also called an annual well check.  Regular dental and eye exams.  Immunizations.  Screening for certain conditions.  Healthy lifestyle choices, such as diet and exercise. What can I expect for my preventive care visit? Physical exam Your health care provider will check:  Height and weight. These may be used to calculate body mass index (BMI), which is a measurement that tells if you are at a healthy weight.  Heart rate and blood pressure.  Your skin for abnormal spots. Counseling Your health care provider may ask you questions about:  Alcohol, tobacco, and drug use.  Emotional well-being.  Home and relationship well-being.  Sexual activity.  Eating habits.  History of falls.  Memory and ability to understand (cognition).  Work and work Statistician.  Pregnancy and menstrual history. What immunizations do I need?  Influenza (flu) vaccine  This is recommended every year. Tetanus, diphtheria, and pertussis (Tdap) vaccine  You may need a Td booster every 10 years. Varicella (chickenpox) vaccine  You may need this vaccine if you have not already been vaccinated. Zoster (shingles) vaccine  You may need this after age 32. Pneumococcal conjugate (PCV13) vaccine  One dose is recommended after age 32. Pneumococcal polysaccharide (PPSV23) vaccine  One dose is recommended after age 55. Measles, mumps, and rubella (MMR) vaccine  You may need at least one dose of MMR if you were born in 1957 or later. You may also need a second dose. Meningococcal conjugate (MenACWY) vaccine  You  may need this if you have certain conditions. Hepatitis A vaccine  You may need this if you have certain conditions or if you travel or work in places where you may be exposed to hepatitis A. Hepatitis B vaccine  You may need this if you have certain conditions or if you travel or work in places where you may be exposed to hepatitis B. Haemophilus influenzae type b (Hib) vaccine  You may need this if you have certain conditions. You may receive vaccines as individual doses or as more than one vaccine together in one shot (combination vaccines). Talk with your health care provider about the risks and benefits of combination vaccines. What tests do I need? Blood tests  Lipid and cholesterol levels. These may be checked every 5 years, or more frequently depending on your overall health.  Hepatitis C test.  Hepatitis B test. Screening  Lung cancer screening. You may have this screening every year starting at age 87 if you have a 30-pack-year history of smoking and currently smoke or have quit within the past 15 years.  Colorectal cancer screening. All adults should have this screening starting at age 60 and continuing until age 61. Your health care provider may recommend screening at age 78 if you are at increased risk. You will have tests every 1-10 years, depending on your results and the type of screening test.  Diabetes screening. This is done by checking your blood sugar (glucose) after you have not eaten for a while (fasting). You may have this done every 1-3 years.  Mammogram. This may be done every 1-2 years. Talk with your health  care provider about how often you should have regular mammograms.  BRCA-related cancer screening. This may be done if you have a family history of breast, ovarian, tubal, or peritoneal cancers. Other tests  Sexually transmitted disease (STD) testing.  Bone density scan. This is done to screen for osteoporosis. You may have this done starting at age 69.  Follow these instructions at home: Eating and drinking  Eat a diet that includes fresh fruits and vegetables, whole grains, lean protein, and low-fat dairy products. Limit your intake of foods with high amounts of sugar, saturated fats, and salt.  Take vitamin and mineral supplements as recommended by your health care provider.  Do not drink alcohol if your health care provider tells you not to drink.  If you drink alcohol: ? Limit how much you have to 0-1 drink a day. ? Be aware of how much alcohol is in your drink. In the [place] one drink equals one 12 oz bottle of beer (355 mL), one 5 oz glass of wine (148 mL), or one 1 oz glass of hard liquor (44 mL). Lifestyle  Take daily care of your teeth and gums.  Stay active. Exercise for at least 30 minutes on 5 or more days each week.  Do not use any products that contain nicotine or tobacco, such as cigarettes, e-cigarettes, and chewing tobacco. If you need help quitting, ask your health care provider.  If you are sexually active, practice safe sex. Use a condom or other form of protection in order to prevent STIs (sexually transmitted infections).  Talk with your health care provider about taking a low-dose aspirin or statin. What's next?  Go to your health care provider once a year for a well check visit.  Ask your health care provider how often you should have your eyes and teeth checked.  Stay up to date on all vaccines. This information is not intended to replace advice given to you by your health care provider. Make sure you discuss any questions you have with your health care provider. Document Released: 11/10/2015 Document Revised: 10/08/2018 Document Reviewed: 10/08/2018 Elsevier Patient Education  2020 [employer].

## 2019-05-21 NOTE — Addendum Note (Signed)
Addended by: Westley Hummer B on: 05/21/2019 11:32 AM   Modules accepted: Orders

## 2019-05-24 ENCOUNTER — Ambulatory Visit (INDEPENDENT_AMBULATORY_CARE_PROVIDER_SITE_OTHER)
Admission: RE | Admit: 2019-05-24 | Discharge: 2019-05-24 | Disposition: A | Discharge status: Home / Self Care | Payer: Medicare Other | Source: Ambulatory Visit | Attending: Internal Medicine | Admitting: Internal Medicine

## 2019-05-24 ENCOUNTER — Other Ambulatory Visit: Payer: Self-pay

## 2019-05-24 DIAGNOSIS — Z13820 Encounter for screening for osteoporosis: Secondary | ICD-10-CM

## 2019-05-24 LAB — CYTOLOGY - PAP
Adequacy: ABSENT
Diagnosis: NEGATIVE
HPV: NOT DETECTED

## 2019-05-24 LAB — HEPATITIS C ANTIBODY
Hepatitis C Ab: NONREACTIVE
SIGNAL TO CUT-OFF: 0.01 (ref <1.00)

## 2019-05-25 ENCOUNTER — Encounter: Payer: Self-pay | Admitting: Internal Medicine

## 2019-05-25 ENCOUNTER — Other Ambulatory Visit: Payer: Self-pay | Admitting: Internal Medicine

## 2019-05-25 DIAGNOSIS — E559 Vitamin D deficiency, unspecified: Secondary | ICD-10-CM

## 2019-05-25 MED ORDER — VITAMIN D (ERGOCALCIFEROL) 1.25 MG (50000 UNIT) PO CAPS: 50000.0000 [IU] | ORAL_CAPSULE | ORAL | 0 refills | Status: AC

## 2019-06-14 ENCOUNTER — Telehealth: Payer: Self-pay

## 2019-06-14 NOTE — Telephone Encounter (Signed)

## 2019-06-15 ENCOUNTER — Ambulatory Visit (INDEPENDENT_AMBULATORY_CARE_PROVIDER_SITE_OTHER): Payer: Medicare Other | Admitting: Plastic Surgery

## 2019-06-15 ENCOUNTER — Encounter: Payer: Self-pay | Admitting: Plastic Surgery

## 2019-06-15 ENCOUNTER — Telehealth: Payer: Self-pay | Admitting: Internal Medicine

## 2019-06-15 ENCOUNTER — Other Ambulatory Visit: Payer: Self-pay

## 2019-06-15 DIAGNOSIS — N62 Hypertrophy of breast: Secondary | ICD-10-CM | POA: Diagnosis not present

## 2019-06-15 DIAGNOSIS — M546 Pain in thoracic spine: Secondary | ICD-10-CM

## 2019-06-15 DIAGNOSIS — M542 Cervicalgia: Secondary | ICD-10-CM

## 2019-06-15 DIAGNOSIS — M549 Dorsalgia, unspecified: Secondary | ICD-10-CM | POA: Insufficient documentation

## 2019-06-15 DIAGNOSIS — G8929 Other chronic pain: Secondary | ICD-10-CM

## 2019-06-15 NOTE — Telephone Encounter (Signed)
Ok to order PT?

## 2019-06-15 NOTE — Telephone Encounter (Signed)
Pt called and stated that she is getting a breast reduction and she will need to schedule PT for back pain. Insurance is requiring this. Pt would like a call back from the nurse regarding.  Please advise

## 2019-06-15 NOTE — Progress Notes (Signed)
Patient ID: Tracy Pena, female    DOB: [birth year], 68 y.o.   MRN: [id number]   Chief Complaint  Patient presents with  . Breast Problem    Mammary Hyperplasia: The patient is a 68 y.o. female with a history of mammary hyperplasia for several years.  She has extremely large breasts causing symptoms that include the following: Back pain (upper and lower) and neck pain. She frequently pins bra cups higher on straps for better lift and relief. Notices relief when holding breast up in her hands. Shoulder straps causing grooves, pain occasionally requiring padding. Pain medication is sometimes required with motrin and tylenol.  Activities that are hindered by enlarged breasts include: Running and exercise  Her breasts are extremely large and fairly symmetric.  She has hyperpigmentation of the inframammary area on both sides.  The sternal to nipple distance on the right is 30 cm and the left is 30 cm.  The IMF distance is 12 cm.  She is 5 feet 3 inches tall and weighs 146 pounds.  Preoperative bra size = 34 DDD cup.  She would like to be a C cup.  The estimated excess breast tissue to be removed at the time of surgery = 400 grams on the left and 400 grams on the right.  Mammogram history: October 2019 and it was negative.  She also does not have any family history of breast cancer that she knows about.  Is a non-smoker.   Review of Systems  Constitutional: Positive for activity change. Negative for appetite change.  HENT: Negative.   Eyes: Negative.   Respiratory: Negative for chest tightness and shortness of breath.   Cardiovascular: Negative for leg swelling.  Gastrointestinal: Negative for abdominal distention and abdominal pain.  Endocrine: Negative.   Genitourinary: Negative.   Musculoskeletal: Positive for back pain and neck pain.  Neurological: Negative.   Hematological: Negative.   Psychiatric/Behavioral: Negative.     Past Medical History:  Diagnosis Date  . Decreased  hearing   . H/O cholesteatoma    [facility]  . Hx of varicella    as a child    Past Surgical History:  Procedure Laterality Date  . INNER EAR SURGERY     at [facility] for Cholesteatoma artificial ear drum  . REPEAT CESAREAN SECTION     had 3      Current Outpatient Medications:  .  Ibuprofen 200 MG CAPS, Take 1 capsule by mouth as needed.  , Disp: , Rfl:  .  traZODone (DESYREL) 50 MG tablet, Take 0.5-1 tablets (25-50 mg total) by mouth at bedtime as needed. for sleep, Disp: 90 tablet, Rfl: 0 .  Vitamin D, Ergocalciferol, (DRISDOL) 1.25 MG (50000 UT) CAPS capsule, Take 1 capsule (50,000 Units total) by mouth every 7 (seven) days for 12 doses., Disp: 12 capsule, Rfl: 0   Objective:   There were no vitals filed for this visit.  Physical Exam Constitutional:      Appearance: Normal appearance.  HENT:     Head: Normocephalic and atraumatic.  Eyes:     Extraocular Movements: Extraocular movements intact.  Neck:     Musculoskeletal: Normal range of motion.  Cardiovascular:     Rate and Rhythm: Normal rate.     Pulses: Normal pulses.  Pulmonary:     Effort: Pulmonary effort is normal.  Abdominal:     General: Abdomen is flat. There is no distension.     Tenderness: There is no abdominal tenderness.  Skin:    General: Skin is warm.  Neurological:     General: No focal deficit present.     Mental Status: She is alert and oriented to person, place, and time.  Psychiatric:        Mood and Affect: Mood normal.        Behavior: Behavior normal.        Thought Content: Thought content normal.        Judgment: Judgment normal.     Assessment & Plan:     ICD-10-CM   1. Chronic bilateral thoracic back pain  M54.6    G89.29   2. Neck pain  M54.2   3. Symptomatic mammary hypertrophy  N62     Recommend bilateral breast reduction. She is a good candidate for a superior medial pedicle technique.  Pictures were obtained of the patient and placed in the chart with the patient's  or guardian's permission.  [place], DO

## 2019-06-16 NOTE — Telephone Encounter (Signed)
Referral for PT placed

## 2019-06-28 ENCOUNTER — Telehealth: Payer: Self-pay | Admitting: Plastic Surgery

## 2019-06-28 ENCOUNTER — Other Ambulatory Visit: Payer: Self-pay

## 2019-06-28 ENCOUNTER — Ambulatory Visit: Payer: Medicare Other | Attending: Internal Medicine

## 2019-06-28 DIAGNOSIS — M542 Cervicalgia: Secondary | ICD-10-CM | POA: Diagnosis not present

## 2019-06-28 DIAGNOSIS — M545 Low back pain: Secondary | ICD-10-CM | POA: Diagnosis not present

## 2019-06-28 DIAGNOSIS — G8929 Other chronic pain: Secondary | ICD-10-CM | POA: Diagnosis not present

## 2019-06-28 DIAGNOSIS — M546 Pain in thoracic spine: Secondary | ICD-10-CM | POA: Diagnosis not present

## 2019-06-28 DIAGNOSIS — R252 Cramp and spasm: Secondary | ICD-10-CM | POA: Diagnosis not present

## 2019-06-28 NOTE — Patient Instructions (Signed)
Access Code: [email or web address]  URL: [email or web address]  Date: 06/28/2019  Prepared by: Sigurd Sos   Exercises Seated Cervical Flexion AROM - 3 reps - 1 sets - 20 hold - 3x daily                            - 7x weekly Seated Cervical Sidebending AROM - 3 reps - 1 sets - 20 hold - 3x daily - 7x weekly Seated Cervical Rotation AROM - 3 reps - 1 sets - 20 hold - 3x daily - 7x weekly Seated Correct Posture - 10 reps - 3 sets - 1x daily - 7x weekly Supine Lower Trunk Rotation - 10 reps - 1 sets - 20 hold - 3x daily - 7x weekly

## 2019-06-28 NOTE — Therapy (Signed)
[facility] [address], [place], [place], [address] Phone: [phone]   Fax:  [phone]  Physical Therapy Evaluation  Patient Details  Name: Tracy Pena MRN: [id number] Date of Birth: 06-22-1951 Referring Provider (PT): Elon Jester, MD   Encounter Date: 06/28/2019  PT End of Session - 06/28/19 1226    Visit Number  1    Date for PT Re-Evaluation  08/23/19    Authorization Type  Medicare    PT Start Time  1147    PT Stop Time  1227    PT Time Calculation (min)  40 min    Activity Tolerance  Patient tolerated treatment well    Behavior During Therapy  [facility] for tasks assessed/performed       Past Medical History:  Diagnosis Date  . Decreased hearing   . H/O cholesteatoma    [facility]  . Hx of varicella    as a child    Past Surgical History:  Procedure Laterality Date  . INNER EAR SURGERY     at [facility] for Cholesteatoma artificial ear drum  . REPEAT CESAREAN SECTION     had 3    There were no vitals filed for this visit.   Subjective Assessment - 06/28/19 1151    Subjective  Pt presents to PT with complaints of chronic thoracic and Rt>Lt upper trap pain that began 6-7 years ago without cause.  Pt has large breasts and feels this is contributing to her pain.    Pertinent History  none    How long can you stand comfortably?  pain in thoracic pain- 45 minute max    Diagnostic tests  bone scan-osteopenia, DDD in lumbar spine    Patient Stated Goals  reuce thoracic pain    Currently in Pain?  Yes    Pain Score  4     Pain Location  Thoracic    Pain Orientation  Right;Left;Medial    Pain Descriptors / Indicators  Dull;Constant;Burning;Aching    Pain Type  Chronic pain    Pain Onset  More than a month ago    Pain Frequency  Constant    Aggravating Factors   standing long periods, constant    Pain Relieving Factors  massager, hot showers         [facility] PT Assessment -  06/28/19 0001      Assessment   Medical Diagnosis  back pain    Referring Provider (PT)  Elon Jester, MD    Onset Date/Surgical Date  06/27/12    Hand Dominance  Right    Next MD Visit  as needed    Prior Therapy  none      Precautions   Precautions  None      Restrictions   Weight Bearing Restrictions  No      Balance Screen   Has the patient fallen in the past 6 months  No    Has the patient had a decrease in activity level because of a fear of falling?   No    Is the patient reluctant to leave their home because of a fear of falling?   No      Home Environment   Living Environment  Private residence    Type of [place] to enter    [place]  Two level      Prior Function   Level of Independence  Independent  Vocation  Retired    Leisure  walk      Charity fundraiser Status  Within [employer] for tasks assessed      Observation/Other Assessments   Focus on Therapeutic Outcomes (FOTO)   37% limitation      Posture/Postural Control   Posture/Postural Control  Postural limitations    Postural Limitations  Forward head;Rounded Shoulders      ROM / Strength   AROM / PROM / Strength  PROM;AROM;Strength      AROM   Overall AROM   Deficits    Overall AROM Comments  UE A/ROM and lumbar A/ROM are full.  Thoracic mobility limited by 25% in all directions    AROM Assessment Site  Cervical    Cervical Flexion  --   full   Cervical - Right Side Bend  30    Cervical - Left Side Bend  45    Cervical - Right Rotation  50    Cervical - Left Rotation  70      PROM   Overall PROM   Within functional limits for tasks performed    Overall PROM Comments  full cervical P/ROM with pain with Rt sidebending and rotation      Strength   Overall Strength  Within functional limits for tasks performed      Palpation   Spinal mobility  reduced cervical PA mobility C2-5, rotation at C6, painful and reduced PA mobility in  the thoracic and lumbar spine      Ambulation/Gait   Gait Pattern  Within Functional Limits                Objective measurements completed on examination: See above findings.              PT Education - 06/28/19 1221    Education Details  Access Code: [email or web address]    Person(s) Educated  Patient    Methods  Explanation;Demonstration;Handout    Comprehension  Verbalized understanding;Returned demonstration       PT Short Term Goals - 06/28/19 1319      PT SHORT TERM GOAL #1   Title  be independent in initial HEP    Time  4    Period  Weeks    Status  New    Target Date  07/26/19      PT SHORT TERM GOAL #2   Title  report a 30% reduction in thoracic and cervical pain with standing and turning head    Time  4    Period  Weeks    Status  New    Target Date  07/26/19      PT SHORT TERM GOAL #3   Title  demonstrate Rt cervical rotation to > or = to 60 degrees to improve safety with driving    Time  4    Period  Weeks    Status  New    Target Date  07/26/19        PT Long Term Goals - 06/28/19 1321      PT LONG TERM GOAL #1   Title  be independent in advanced HEP    Time  8    Period  Weeks    Status  New    Target Date  08/23/19      PT LONG TERM GOAL #2   Title  reduce FOTO to < or = to 28% limitation    Time  8    Period  Weeks    Status  New    Target Date  08/23/19      PT LONG TERM GOAL #3   Title  demonstrate Rt cervical rotation to > or = to 70 degrees to improve safety with driving    Time  8    Period  Weeks    Status  New    Target Date  08/23/19      PT LONG TERM GOAL #4   Title  report a 60% reduction in neck and thoracic pain with turning head and standing    Time  8    Period  Weeks    Status  New    Target Date  08/23/19      PT LONG TERM GOAL #5   Title  verbalize and demonstrate understanding of postural alignment and exercises to improve postural endurance    Time  8    Period  Weeks    Status  New    Target  Date  08/23/19             Plan - 06/28/19 1232    Clinical Impression Statement  Pt presents to PT with complaints of Rt>Lt thoracic pain, mild LBP and limited cervical A/ROM of a chronic nature lasting > 7 years.  Pt has large breasts, forward head and rounded shoulder posture.  Pt demonstrates limited cervical A/ROM into Rt>Lt motion with pain at end range. Pt with tension and trigger points in Rt>Lt upper traps, cervical paraspinals and bil rhomboids.  Mild tension and pain in bil lumbar paraspinals.  Pt reports 4/10 thoracic pain with standing for long periods and neck pain with turning head.  Pt will benefit from skilled PT for postural strength to improve postural endurance, manual to address muscle tension and pain and flexibility to improve segmental mobility in the thoracic and cervical spine.    Personal Factors and Comorbidities  Comorbidity 1    Comorbidities  osteopenia    Examination-Activity Limitations  Stand    Examination-Participation Restrictions  Meal Prep    Stability/Clinical Decision Making  Evolving/Moderate complexity    Clinical Decision Making  Moderate    Rehab Potential  Good    PT Frequency  2x / week    PT Duration  8 weeks    PT Treatment/Interventions  ADLs/Self Care Home Management;Cryotherapy;Electrical Stimulation;Therapeutic activities;Therapeutic exercise;Neuromuscular re-education;Manual techniques;Passive range of motion;Dry needling;Taping;Spinal Manipulations    PT Next Visit Plan  mobs to cervical and thoracic to improve ROM, manual therapy (pt is considering DN), postural strength    PT Home Exercise Plan  Access Code: [email or web address]    Consulted and Agree with Plan of Care  Patient       Patient will benefit from skilled therapeutic intervention in order to improve the following deficits and impairments:  Decreased activity tolerance, Decreased endurance, Decreased range of motion, Decreased strength, Impaired flexibility, Improper body  mechanics  Visit Diagnosis: Chronic bilateral low back pain without sciatica - Plan: PT plan of care cert/re-cert  Pain in thoracic spine - Plan: PT plan of care cert/re-cert  Cervicalgia - Plan: PT plan of care cert/re-cert  Cramp and spasm - Plan: PT plan of care cert/re-cert     Problem List Patient Active Problem List   Diagnosis Date Noted  . Back pain 06/15/2019  . Neck pain 06/15/2019  . Symptomatic mammary hypertrophy 06/15/2019  . Vitamin D deficiency 05/25/2019  . Osteopenia 06/02/2016  . Hyperlipidemia 11/14/2014  . Anxiety state, unspecified 09/20/2013  .  Medication management 04/09/2013  . Elevated blood pressure reading 09/18/2012  . H/O cholesteatoma   . Visit for preventive health examination 09/22/2011  . HYPERLIPIDEMIA 05/01/2010  . VAGINITIS, ATROPHIC, POSTMENOPAUSAL 02/28/2009  . URINALYSIS, ABNORMAL 02/28/2009  . ADVERSE REACTION TO MEDICATION 02/28/2009  . DECREASED HEARING 07/01/2008  . HAND PAIN, BILATERAL 07/01/2008  . Insomnia 07/01/2008     Sigurd Sos, PT 06/28/19 1:31 PM  [facility] [address] [place], [place], [address] Phone: [phone]   Fax:  [phone]  Name: Tracy Pena MRN: [id number] Date of Birth: 09/29/1951

## 2019-06-28 NOTE — Telephone Encounter (Signed)
Called patient to discuss scheduling surgery. Patient would like to call back after she completes her physical therapy sessions, likely in October. Patient also verbalized agreement with the Medicare guidelines for the breast reduction surgery and is completely fine with Dr. Marla Roe removing the minimum required amount of 500 grams bilaterally.

## 2019-06-30 ENCOUNTER — Other Ambulatory Visit: Payer: Self-pay

## 2019-06-30 ENCOUNTER — Ambulatory Visit: Payer: Medicare Other | Attending: Internal Medicine

## 2019-06-30 DIAGNOSIS — M545 Low back pain, unspecified: Secondary | ICD-10-CM

## 2019-06-30 DIAGNOSIS — M546 Pain in thoracic spine: Secondary | ICD-10-CM

## 2019-06-30 DIAGNOSIS — R252 Cramp and spasm: Secondary | ICD-10-CM | POA: Diagnosis not present

## 2019-06-30 DIAGNOSIS — M542 Cervicalgia: Secondary | ICD-10-CM

## 2019-06-30 DIAGNOSIS — G8929 Other chronic pain: Secondary | ICD-10-CM

## 2019-06-30 NOTE — Patient Instructions (Signed)
Access Code: [email or web address]  URL: [email or web address]  Date: 06/30/2019  Prepared by: Sigurd Sos    Supine Piriformis Stretch - 3 reps - 1 sets - 20 hold - 3x daily - 7x weekly Seated Piriformis Stretch with Trunk Bend - 3 reps - 1 sets - 20 hold - 3x daily - 7x weekly Sidelying Open Book Thoracic Rotation with Knee on Foam Roll - 10 reps - 1 sets - 3x daily - 7x weekly  Cervico-Thoracic: Extension / Rotation (Sitting)    Reach across body with left arm and grasp back of chair. Gently look over right side shoulder. Hold __20__ seconds. Relax. Repeat __3__ times per set. Do __1__ sets per session. Do __3__ sessions per day.  http://orth.exer.us/981   Copyright  VHI. All rights reserved.   [facility] [address], [place], [place] [address] Phone # [phone] Fax [phone]

## 2019-06-30 NOTE — Therapy (Signed)
[facility] [address] [place], [place], [address] Phone: [phone]   Fax:  [phone]  Physical Therapy Treatment  Patient Details  Name: Tracy Pena MRN: [id number] Date of Birth: [birth year] Referring Provider (Tracy Pena): Elon Jester, MD   Encounter Date: 06/30/2019  Tracy Pena End of Session - 06/30/19 1445    Visit Number  2    Date for Tracy Pena Re-Evaluation  08/23/19    Authorization Type  Medicare    Tracy Pena Start Time  1402    Tracy Pena Stop Time  1444    Tracy Pena Time Calculation (min)  42 min    Activity Tolerance  Patient tolerated treatment well    Behavior During Therapy  [facility] for tasks assessed/performed       Past Medical History:  Diagnosis Date  . Decreased hearing   . H/O cholesteatoma    [facility]  . Hx of varicella    as a child    Past Surgical History:  Procedure Laterality Date  . INNER EAR SURGERY     at [facility] for Cholesteatoma artificial ear drum  . REPEAT CESAREAN SECTION     had 3    There were no vitals filed for this visit.  Subjective Assessment - 06/30/19 1404    Subjective  I had pain in my Rt gluteals with sleep at night.    Currently in Pain?  Yes    Pain Score  3    Rt gluteals 6/10 at night   Pain Location  Thoracic    Pain Orientation  Left;Right;Medial                       [facility] Adult Tracy Pena Treatment/Exercise - 06/30/19 0001      Exercises   Exercises  Neck;Lumbar      Neck Exercises: Machines for Strengthening   UBE (Upper Arm Bike)  Level 1x 6 minutes (3/3)   verbal cues for posture     Lumbar Exercises: Stretches   Figure 4 Stretch  3 reps;20 seconds    Figure 4 Stretch Limitations  seated and supine       Lumbar Exercises: Sidelying   Other Sidelying Lumbar Exercises  open book stretch x 10 eachs      Manual Therapy   Manual Therapy  Soft tissue mobilization;Myofascial release    Manual therapy comments  Addaday with blue attachement to  Rt gluteals, lumbar paraspinals and thoracic paraspinals      Neck Exercises: Stretches   Upper Trapezius Stretch  Left;Right;3 reps;20 seconds    Other Neck Stretches  cervical rotation 3x20 seconds    Other Neck Stretches  cervicothoracic rotation 3x20 seconds               Tracy Pena Short Term Goals - 06/28/19 1319      Tracy Pena SHORT TERM GOAL #1   Title  be independent in initial HEP    Time  4    Period  Weeks    Status  New    Target Date  07/26/19      Tracy Pena SHORT TERM GOAL #2   Title  report a 30% reduction in thoracic and cervical pain with standing and turning head    Time  4    Period  Weeks    Status  New    Target Date  07/26/19      Tracy Pena SHORT TERM GOAL #3   Title  demonstrate Rt cervical rotation  to > or = to 60 degrees to improve safety with driving    Time  4    Period  Weeks    Status  New    Target Date  07/26/19        Tracy Pena Long Term Goals - 06/28/19 1321      Tracy Pena LONG TERM GOAL #1   Title  be independent in advanced HEP    Time  8    Period  Weeks    Status  New    Target Date  08/23/19      Tracy Pena LONG TERM GOAL #2   Title  reduce FOTO to < or = to 28% limitation    Time  8    Period  Weeks    Status  New    Target Date  08/23/19      Tracy Pena LONG TERM GOAL #3   Title  demonstrate Rt cervical rotation to > or = to 70 degrees to improve safety with driving    Time  8    Period  Weeks    Status  New    Target Date  08/23/19      Tracy Pena LONG TERM GOAL #4   Title  report a 60% reduction in neck and thoracic pain with turning head and standing    Time  8    Period  Weeks    Status  New    Target Date  08/23/19      Tracy Pena LONG TERM GOAL #5   Title  verbalize and demonstrate understanding of postural alignment and exercises to improve postural endurance    Time  8    Period  Weeks    Status  New    Target Date  08/23/19            Plan - 06/30/19 1411    Clinical Impression Statement  Tracy Pena with first time follow-up after evaluation.  Tracy Pena with increased  Rt gluteal pain at night with sleep and demonstrates reduced A/ROM of the Rt hip.  Tracy Pena added new HEP to address thoracic and lumbar/hip flexibility. Tracy Pena provided demo and tactile cues for technique with exercise today.  Tracy Pena demonstrates improved seated posture and reduced scapular elevation.  Tracy Pena with tension in Rt gluteals and responded well to Addaday and tissue mobilization in the lumbar and thoracic spine.  Tracy Pena will continue to benefit from skilled Tracy Pena for flexibility, core and postural strength and manual therapy.    Tracy Pena Treatment/Interventions  ADLs/Self Care Home Management;Cryotherapy;Electrical Stimulation;Therapeutic activities;Therapeutic exercise;Neuromuscular re-education;Manual techniques;Passive range of motion;Dry needling;Taping;Spinal Manipulations    Tracy Pena Next Visit Plan  cervical/thoracic mobility, postural and core strength, manual therapy (Tracy Pena doesn't want to try dry needling)    Tracy Pena Home Exercise Plan  Access Code: [email or web address]    Recommended Other Services  initial certification is signed    Consulted and Agree with Plan of Care  Patient       Patient will benefit from skilled therapeutic intervention in order to improve the following deficits and impairments:  Decreased activity tolerance, Decreased endurance, Decreased range of motion, Decreased strength, Impaired flexibility, Improper body mechanics  Visit Diagnosis: Chronic bilateral low back pain without sciatica  Pain in thoracic spine  Cervicalgia  Cramp and spasm     Problem List Patient Active Problem List   Diagnosis Date Noted  . Back pain 06/15/2019  . Neck pain 06/15/2019  . Symptomatic mammary hypertrophy 06/15/2019  . Vitamin D deficiency 05/25/2019  . Osteopenia  06/02/2016  . Hyperlipidemia 11/14/2014  . Anxiety state, unspecified 09/20/2013  . Medication management 04/09/2013  . Elevated blood pressure reading 09/18/2012  . H/O cholesteatoma   . Visit for preventive health examination 09/22/2011  .  HYPERLIPIDEMIA 05/01/2010  . VAGINITIS, ATROPHIC, POSTMENOPAUSAL 02/28/2009  . URINALYSIS, ABNORMAL 02/28/2009  . ADVERSE REACTION TO MEDICATION 02/28/2009  . DECREASED HEARING 07/01/2008  . HAND PAIN, BILATERAL 07/01/2008  . Insomnia 07/01/2008     Tracy Pena, Tracy Pena 06/30/19 2:47 PM  [facility] [address] [place], [place], [address] Phone: [phone]   Fax:  [phone]  Name: Tracy Pena MRN: [id number] Date of Birth: 1951-09-18

## 2019-07-07 ENCOUNTER — Other Ambulatory Visit: Payer: Self-pay

## 2019-07-07 ENCOUNTER — Ambulatory Visit: Payer: Medicare Other

## 2019-07-07 DIAGNOSIS — G8929 Other chronic pain: Secondary | ICD-10-CM

## 2019-07-07 DIAGNOSIS — R252 Cramp and spasm: Secondary | ICD-10-CM | POA: Diagnosis not present

## 2019-07-07 DIAGNOSIS — M542 Cervicalgia: Secondary | ICD-10-CM | POA: Diagnosis not present

## 2019-07-07 DIAGNOSIS — M546 Pain in thoracic spine: Secondary | ICD-10-CM | POA: Diagnosis not present

## 2019-07-07 DIAGNOSIS — M545 Low back pain: Secondary | ICD-10-CM | POA: Diagnosis not present

## 2019-07-07 NOTE — Patient Instructions (Signed)
Access Code: [email or web address]  URL: [email or web address]  Date: 07/07/2019  Prepared by: Sigurd Sos   Exercises  Clamshell - 10 reps - 2 sets - 2x daily - 7x weekly Supine Shoulder Horizontal Abduction with Resistance - 10 reps - 2 sets - 2x daily - 7x weekly Supine Shoulder External Rotation with Resistance - 10 reps - 2 sets - 2x daily - 7x weekly

## 2019-07-07 NOTE — Therapy (Addendum)
[facility] [address] [place], [place], [address] Phone: [phone]   Fax:  [phone]  Physical Therapy Treatment  Patient Details  Name: Tracy Pena MRN: [id number] Date of Birth: 09-01-1951 Referring Provider (PT): Elon Jester, MD   Encounter Date: 07/07/2019  PT End of Session - 07/07/19 1156    Visit Number  3    Date for PT Re-Evaluation  08/23/19    Authorization Type  Medicare    PT Start Time  1105    PT Stop Time  1153    PT Time Calculation (min)  48 min    Activity Tolerance  Patient tolerated treatment well    Behavior During Therapy  [facility] for tasks assessed/performed       Past Medical History:  Diagnosis Date  . Decreased hearing   . H/O cholesteatoma    [facility]  . Hx of varicella    as a child    Past Surgical History:  Procedure Laterality Date  . INNER EAR SURGERY     at [facility] for Cholesteatoma artificial ear drum  . REPEAT CESAREAN SECTION     had 3    There were no vitals filed for this visit.  Subjective Assessment - 07/07/19 1105    Subjective  My hip pain comes and goes. Last night was a bad night but otherwise things have felt good.    Patient Stated Goals  reuce thoracic pain    Currently in Pain?  No/denies   No thoracic/cervical pain, Rt gluteals up to 7-8/10 last night with sleep                      [facility] Adult PT Treatment/Exercise - 07/07/19 0001      Neck Exercises: Machines for Strengthening   Nustep  Level 3 x 8 minutes    PT present to discuss progress     Neck Exercises: Supine   Other Supine Exercise  horizontal abduction and D2 2x10 bil      Lumbar Exercises: Stretches   Figure 4 Stretch  3 reps;20 seconds    Figure 4 Stretch Limitations  seated and supine       Lumbar Exercises: Sidelying   Clam  Both;20 reps    Other Sidelying Lumbar Exercises  open book stretch x 10 eachs      Neck Exercises:  Stretches   Other Neck Stretches  cervicothoracic rotation 3x20 seconds       Manual: Addaday to Rt gluteals and lumbar paraspinals       PT Education - 07/07/19 1137    Education Details  Access Code: [email or web address]    Person(s) Educated  Patient    Methods  Explanation;Demonstration    Comprehension  Verbalized understanding;Returned demonstration       PT Short Term Goals - 06/28/19 1319      PT SHORT TERM GOAL #1   Title  be independent in initial HEP    Time  4    Period  Weeks    Status  New    Target Date  07/26/19      PT SHORT TERM GOAL #2   Title  report a 30% reduction in thoracic and cervical pain with standing and turning head    Time  4    Period  Weeks    Status  New    Target Date  07/26/19      PT SHORT TERM GOAL #3  Title  demonstrate Rt cervical rotation to > or = to 60 degrees to improve safety with driving    Time  4    Period  Weeks    Status  New    Target Date  07/26/19        PT Long Term Goals - 06/28/19 1321      PT LONG TERM GOAL #1   Title  be independent in advanced HEP    Time  8    Period  Weeks    Status  New    Target Date  08/23/19      PT LONG TERM GOAL #2   Title  reduce FOTO to < or = to 28% limitation    Time  8    Period  Weeks    Status  New    Target Date  08/23/19      PT LONG TERM GOAL #3   Title  demonstrate Rt cervical rotation to > or = to 70 degrees to improve safety with driving    Time  8    Period  Weeks    Status  New    Target Date  08/23/19      PT LONG TERM GOAL #4   Title  report a 60% reduction in neck and thoracic pain with turning head and standing    Time  8    Period  Weeks    Status  New    Target Date  08/23/19      PT LONG TERM GOAL #5   Title  verbalize and demonstrate understanding of postural alignment and exercises to improve postural endurance    Time  8    Period  Weeks    Status  New    Target Date  08/23/19            Plan - 07/07/19 1200    Clinical Impression  Statement  Pt denies any thoracic or neck pain.  Pt with increased Rt gluteal pain mostly at night.  Progression of exercise focused on thoracic strength and gluteal strength today.  Pt with tension in Rt gluteals and lumbar/thoracic paraspinals and demonstrated improved mobility after manual therapy today.  Pt will continue to benefit from skilled PT to address postural weakness, flexibility and pain.    Rehab Potential  Good    PT Frequency  2x / week    PT Duration  8 weeks    PT Treatment/Interventions  ADLs/Self Care Home Management;Cryotherapy;Electrical Stimulation;Therapeutic activities;Therapeutic exercise;Neuromuscular re-education;Manual techniques;Passive range of motion;Dry needling;Taping;Spinal Manipulations    PT Next Visit Plan  cervical/thoracic mobility, postural and core strength, manual therapy (pt doesn't want to try dry needling)    PT Home Exercise Plan  Access Code: [email or web address]    Consulted and Agree with Plan of Care  Patient       Patient will benefit from skilled therapeutic intervention in order to improve the following deficits and impairments:  Decreased activity tolerance, Decreased endurance, Decreased range of motion, Decreased strength, Impaired flexibility, Improper body mechanics  Visit Diagnosis: Chronic bilateral low back pain without sciatica  Pain in thoracic spine  Cervicalgia  Cramp and spasm     Problem List Patient Active Problem List   Diagnosis Date Noted  . Back pain 06/15/2019  . Neck pain 06/15/2019  . Symptomatic mammary hypertrophy 06/15/2019  . Vitamin D deficiency 05/25/2019  . Osteopenia 06/02/2016  . Hyperlipidemia 11/14/2014  . Anxiety state, unspecified 09/20/2013  . Medication management  04/09/2013  . Elevated blood pressure reading 09/18/2012  . H/O cholesteatoma   . Visit for preventive health examination 09/22/2011  . HYPERLIPIDEMIA 05/01/2010  . VAGINITIS, ATROPHIC, POSTMENOPAUSAL 02/28/2009  . URINALYSIS, ABNORMAL  02/28/2009  . ADVERSE REACTION TO MEDICATION 02/28/2009  . DECREASED HEARING 07/01/2008  . HAND PAIN, BILATERAL 07/01/2008  . Insomnia 07/01/2008     Sigurd Sos, PT 07/07/19 12:01 PM  [facility] [address] [place], [place], [address] Phone: [phone]   Fax:  [phone]  Name: JASIBE VALLEZ MRN: [id number] Date of Birth: 1951-08-09

## 2019-07-09 ENCOUNTER — Ambulatory Visit: Payer: Medicare Other | Admitting: Physical Therapy

## 2019-07-12 ENCOUNTER — Other Ambulatory Visit: Payer: Self-pay

## 2019-07-12 ENCOUNTER — Ambulatory Visit: Payer: Medicare Other

## 2019-07-12 DIAGNOSIS — R252 Cramp and spasm: Secondary | ICD-10-CM | POA: Diagnosis not present

## 2019-07-12 DIAGNOSIS — G8929 Other chronic pain: Secondary | ICD-10-CM

## 2019-07-12 DIAGNOSIS — M542 Cervicalgia: Secondary | ICD-10-CM | POA: Diagnosis not present

## 2019-07-12 DIAGNOSIS — M545 Low back pain: Secondary | ICD-10-CM | POA: Diagnosis not present

## 2019-07-12 DIAGNOSIS — M546 Pain in thoracic spine: Secondary | ICD-10-CM | POA: Diagnosis not present

## 2019-07-12 NOTE — Therapy (Signed)
[facility] [address] [place], [place], [address] Phone: [phone]   Fax:  [phone]  Physical Therapy Treatment  Patient Details  Name: Tracy Pena MRN: [id number] Date of Birth: [birth year] Referring Provider (PT): Elon Jester, MD   Encounter Date: 07/12/2019  PT End of Session - 07/12/19 1445    Visit Number  4    Date for PT Re-Evaluation  08/23/19    Authorization Type  Medicare    PT Start Time  1401    PT Stop Time  1444    PT Time Calculation (min)  43 min    Activity Tolerance  Patient tolerated treatment well    Behavior During Therapy  [facility] for tasks assessed/performed       Past Medical History:  Diagnosis Date  . Decreased hearing   . H/O cholesteatoma    [facility]  . Hx of varicella    as a child    Past Surgical History:  Procedure Laterality Date  . INNER EAR SURGERY     at [facility] for Cholesteatoma artificial ear drum  . REPEAT CESAREAN SECTION     had 3    There were no vitals filed for this visit.  Subjective Assessment - 07/12/19 1404    Subjective  I didn't do much over the weekend.    Currently in Pain?  Yes    Pain Score  --   up to 5/10- worse at night   Pain Descriptors / Indicators  Dull;Constant;Burning;Aching    Pain Onset  More than a month ago    Pain Frequency  Constant    Aggravating Factors   night time    Pain Relieving Factors  massager, hot showers         [facility] PT Assessment - 07/12/19 0001      Assessment   Medical Diagnosis  back pain      AROM   Cervical - Right Side Bend  36    Cervical - Left Side Bend  45    Cervical - Right Rotation  80    Cervical - Left Rotation  80                   [facility] Adult PT Treatment/Exercise - 07/12/19 0001      Neck Exercises: Machines for Strengthening   UBE (Upper Arm Bike)  Level 1x 6 minutes (3/3)   verbal cues for posture     Neck Exercises: Supine   Other  Supine Exercise  horizontal abduction and ER  2x10 bil      Lumbar Exercises: Stretches   Single Knee to Chest Stretch  Left;Right;3 reps;20 seconds    Figure 4 Stretch  3 reps;20 seconds    Figure 4 Stretch Limitations  seated and supine       Lumbar Exercises: Seated   Sit to Stand  20 reps    Sit to Stand Limitations  holding 5# weight      Lumbar Exercises: Supine   Bridge with Ball Squeeze  20 reps;5 seconds      Manual Therapy   Manual Therapy  Soft tissue mobilization;Myofascial release    Manual therapy comments  Addaday with blue attachement to Rt gluteals, lumbar paraspinals and thoracic paraspinals      Neck Exercises: Stretches   Other Neck Stretches  cervical rotation 3x20 seconds               PT Short Term  Goals - 07/12/19 1407      PT SHORT TERM GOAL #1   Title  be independent in initial HEP    Status  Achieved      PT SHORT TERM GOAL #2   Title  report a 30% reduction in thoracic and cervical pain with standing and turning head    Status  Achieved      PT SHORT TERM GOAL #3   Title  demonstrate Rt cervical rotation to > or = to 60 degrees to improve safety with driving    Baseline  80 degrees    Time  --    Period  --    Status  Achieved        PT Long Term Goals - 07/12/19 1415      PT LONG TERM GOAL #3   Title  demonstrate Rt cervical rotation to > or = to 70 degrees to improve safety with driving    Baseline  80    Status  Achieved      PT LONG TERM GOAL #5   Title  verbalize and demonstrate understanding of postural alignment and exercises to improve postural endurance    Baseline  working on this.  Still with hyperextension at spine with reduced core recruitment    Time  8    Period  Weeks    Status  On-going            Plan - 07/12/19 1416    Clinical Impression Statement  Pt is making good progress with PT.  Pt denies any neck or thoracic pain over the past week.  Pt demonstrates improved cervical A/ROM in all directions  today.  Pt requires verbal cues for core activation and neutral spine with exercise in the clinic today.  Pt is challenged with recruitment of TA with supine band exercises.  Pt will continue to benefit from skilled PT for postural, core and gluteal strength.    PT Frequency  2x / week    PT Duration  8 weeks    PT Treatment/Interventions  ADLs/Self Care Home Management;Cryotherapy;Electrical Stimulation;Therapeutic activities;Therapeutic exercise;Neuromuscular re-education;Manual techniques;Passive range of motion;Dry needling;Taping;Spinal Manipulations    PT Next Visit Plan  cervical/thoracic mobility, postural, gluteal and core strength, manual therapy (pt doesn't want to try dry needling)    PT Home Exercise Plan  Access Code: [email or web address]    Consulted and Agree with Plan of Care  Patient       Patient will benefit from skilled therapeutic intervention in order to improve the following deficits and impairments:  Decreased activity tolerance, Decreased endurance, Decreased range of motion, Decreased strength, Impaired flexibility, Improper body mechanics  Visit Diagnosis: Chronic bilateral low back pain without sciatica  Pain in thoracic spine  Cervicalgia  Cramp and spasm     Problem List Patient Active Problem List   Diagnosis Date Noted  . Back pain 06/15/2019  . Neck pain 06/15/2019  . Symptomatic mammary hypertrophy 06/15/2019  . Vitamin D deficiency 05/25/2019  . Osteopenia 06/02/2016  . Hyperlipidemia 11/14/2014  . Anxiety state, unspecified 09/20/2013  . Medication management 04/09/2013  . Elevated blood pressure reading 09/18/2012  . H/O cholesteatoma   . Visit for preventive health examination 09/22/2011  . HYPERLIPIDEMIA 05/01/2010  . VAGINITIS, ATROPHIC, POSTMENOPAUSAL 02/28/2009  . URINALYSIS, ABNORMAL 02/28/2009  . ADVERSE REACTION TO MEDICATION 02/28/2009  . DECREASED HEARING 07/01/2008  . HAND PAIN, BILATERAL 07/01/2008  . Insomnia 07/01/2008    Sigurd Sos, PT 07/12/19 2:46 PM  [facility] [address] [place], [place], [address] Phone: [phone]   Fax:  [phone]  Name: TERRITA MORI MRN: [id number] Date of Birth: 04/12/1951

## 2019-07-19 ENCOUNTER — Ambulatory Visit: Payer: Medicare Other

## 2019-07-19 ENCOUNTER — Other Ambulatory Visit: Payer: Self-pay

## 2019-07-19 DIAGNOSIS — M542 Cervicalgia: Secondary | ICD-10-CM | POA: Diagnosis not present

## 2019-07-19 DIAGNOSIS — G8929 Other chronic pain: Secondary | ICD-10-CM

## 2019-07-19 DIAGNOSIS — M545 Low back pain, unspecified: Secondary | ICD-10-CM

## 2019-07-19 DIAGNOSIS — M546 Pain in thoracic spine: Secondary | ICD-10-CM | POA: Diagnosis not present

## 2019-07-19 DIAGNOSIS — R252 Cramp and spasm: Secondary | ICD-10-CM

## 2019-07-19 NOTE — Therapy (Signed)
[facility] [address] [place], [place], [address] Phone: [phone]   Fax:  [phone]  Physical Therapy Treatment  Patient Details  Name: Tracy Pena MRN: [id number] Date of Birth: [birth year] Referring Provider (PT): Elon Jester, MD   Encounter Date: 07/19/2019  PT End of Session - 07/19/19 1449    Visit Number  5    Date for PT Re-Evaluation  08/23/19    Authorization Type  Medicare    PT Start Time  [id number]    PT Stop Time  1448    PT Time Calculation (min)  45 min    Activity Tolerance  Patient tolerated treatment well    Behavior During Therapy  [facility] for tasks assessed/performed       Past Medical History:  Diagnosis Date  . Decreased hearing   . H/O cholesteatoma    [facility]  . Hx of varicella    as a child    Past Surgical History:  Procedure Laterality Date  . INNER EAR SURGERY     at [facility] for Cholesteatoma artificial ear drum  . REPEAT CESAREAN SECTION     had 3    There were no vitals filed for this visit.  Subjective Assessment - 07/19/19 1407    Subjective  I am sore on the Rt side of my low back.    Currently in Pain?  Yes    Pain Score  5    no pain now- 5/10 at night   Pain Location  Back    Pain Orientation  Right;Lower    Pain Descriptors / Indicators  Aching;Constant;Burning    Pain Type  Chronic pain    Pain Onset  More than a month ago    Pain Frequency  Constant    Aggravating Factors   night time    Pain Relieving Factors  massager, hot showers                       [facility] Adult PT Treatment/Exercise - 07/19/19 0001      Exercises   Exercises  Knee/Hip      Neck Exercises: Machines for Strengthening   UBE (Upper Arm Bike)  Level 1x 6 minutes (3/3)   verbal cues for posture     Neck Exercises: Supine   Other Supine Exercise  --      Lumbar Exercises: Stretches   Single Knee to Chest Stretch  Left;Right;3 reps;20 seconds     Figure 4 Stretch  3 reps;20 seconds    Figure 4 Stretch Limitations  seated       Lumbar Exercises: Seated   Sit to Stand  20 reps    Sit to Stand Limitations  holding 5# weight    Other Seated Lumbar Exercises  3 way raises: 1# with cueing for posture 2x10 each.      Lumbar Exercises: Supine   Bridge with Ball Squeeze  20 reps;5 seconds      Lumbar Exercises: Sidelying   Other Sidelying Lumbar Exercises  open book stretch x 10 each      Knee/Hip Exercises: Standing   Hip Abduction  Stengthening;Both;1 set;10 reps    Hip Extension  Stengthening;Both;2 sets;10 reps      Manual Therapy   Manual Therapy  Soft tissue mobilization;Myofascial release    Manual therapy comments  Addaday with blue attachement to Rt gluteals, lumbar paraspinals and thoracic paraspinals  PT Short Term Goals - 07/12/19 1407      PT SHORT TERM GOAL #1   Title  be independent in initial HEP    Status  Achieved      PT SHORT TERM GOAL #2   Title  report a 30% reduction in thoracic and cervical pain with standing and turning head    Status  Achieved      PT SHORT TERM GOAL #3   Title  demonstrate Rt cervical rotation to > or = to 60 degrees to improve safety with driving    Baseline  80 degrees    Time  --    Period  --    Status  Achieved        PT Long Term Goals - 07/19/19 1406      PT LONG TERM GOAL #5   Title  verbalize and demonstrate understanding of postural alignment and exercises to improve postural endurance    Status  Achieved            Plan - 07/19/19 1427    Clinical Impression Statement  Pt reports continued pain in Rt low back and gluteals mostly at night.  Pt is independent and compliant in HEP for flexibility and strength.  Pt denies any thoracic and cervical pain and cervical A/ROM is improved overall.  PT focused on postural and core strength and co-contraction of multiple muscle groups with seated and standing exercise.  Pt requires frequent  tactile and verbal cues for standing alignment as she tends to hyperextend at the hips in standing.  Pt will continue to benefit from skilled PT for strength, flexibility and manual to address pain.    PT Frequency  2x / week    PT Duration  8 weeks    PT Treatment/Interventions  ADLs/Self Care Home Management;Cryotherapy;Electrical Stimulation;Therapeutic activities;Therapeutic exercise;Neuromuscular re-education;Manual techniques;Passive range of motion;Dry needling;Taping;Spinal Manipulations    PT Next Visit Plan  cervical/thoracic mobility, postural, gluteal and core strength, manual therapy (pt doesn't want to try dry needling)    PT Home Exercise Plan  Access Code: [email or web address]    Consulted and Agree with Plan of Care  Patient       Patient will benefit from skilled therapeutic intervention in order to improve the following deficits and impairments:  Decreased activity tolerance, Decreased endurance, Decreased range of motion, Decreased strength, Impaired flexibility, Improper body mechanics  Visit Diagnosis: Pain in thoracic spine  Cervicalgia  Cramp and spasm  Chronic bilateral low back pain without sciatica     Problem List Patient Active Problem List   Diagnosis Date Noted  . Back pain 06/15/2019  . Neck pain 06/15/2019  . Symptomatic mammary hypertrophy 06/15/2019  . Vitamin D deficiency 05/25/2019  . Osteopenia 06/02/2016  . Hyperlipidemia 11/14/2014  . Anxiety state, unspecified 09/20/2013  . Medication management 04/09/2013  . Elevated blood pressure reading 09/18/2012  . H/O cholesteatoma   . Visit for preventive health examination 09/22/2011  . HYPERLIPIDEMIA 05/01/2010  . VAGINITIS, ATROPHIC, POSTMENOPAUSAL 02/28/2009  . URINALYSIS, ABNORMAL 02/28/2009  . ADVERSE REACTION TO MEDICATION 02/28/2009  . DECREASED HEARING 07/01/2008  . HAND PAIN, BILATERAL 07/01/2008  . Insomnia 07/01/2008     Sigurd Sos, PT 07/19/19 2:51 PM  [facility] [address] [place], [place], [address] Phone: [phone]   Fax:  [phone]  Name: DEOLINDA SCHLESSER MRN: [id number] Date of Birth: Feb 26, 1951

## 2019-07-21 ENCOUNTER — Ambulatory Visit: Payer: Medicare Other

## 2019-07-21 ENCOUNTER — Other Ambulatory Visit: Payer: Self-pay

## 2019-07-21 DIAGNOSIS — M542 Cervicalgia: Secondary | ICD-10-CM | POA: Diagnosis not present

## 2019-07-21 DIAGNOSIS — M546 Pain in thoracic spine: Secondary | ICD-10-CM | POA: Diagnosis not present

## 2019-07-21 DIAGNOSIS — M545 Low back pain: Secondary | ICD-10-CM | POA: Diagnosis not present

## 2019-07-21 DIAGNOSIS — R252 Cramp and spasm: Secondary | ICD-10-CM

## 2019-07-21 DIAGNOSIS — G8929 Other chronic pain: Secondary | ICD-10-CM | POA: Diagnosis not present

## 2019-07-21 NOTE — Therapy (Addendum)
[facility] [address], [place], [place], [address] Phone: [phone]   Fax:  [phone]  Physical Therapy Treatment  Patient Details  Name: Tracy Pena MRN: [id number] Date of Birth: 11/26/1950 Referring Provider (PT): Elon Jester, MD   Encounter Date: 07/21/2019  PT End of Session - 07/21/19 1444    Visit Number  6    Date for PT Re-Evaluation  08/23/19    Authorization Type  Medicare    PT Start Time  1401    PT Stop Time  1443    PT Time Calculation (min)  42 min    Activity Tolerance  Patient tolerated treatment well    Behavior During Therapy  [facility] for tasks assessed/performed       Past Medical History:  Diagnosis Date  . Decreased hearing   . H/O [facility]esteatoma    [facility]  . Hx of varicella    as a child    Past Surgical History:  Procedure Laterality Date  . INNER EAR SURGERY     at [facility] for Cholesteatoma artificial ear drum  . REPEAT CESAREAN SECTION     had 3    There were no vitals filed for this visit.  Subjective Assessment - 07/21/19 1403    Subjective  I had a bad night of sleep 2 nights ago.  Last night wasn't as bad.    Currently in Pain?  Yes    Pain Score  5     Pain Location  Back    Pain Orientation  Right;Lower                       [facility] Adult PT Treatment/Exercise - 07/21/19 0001      Neck Exercises: Machines for Strengthening   UBE (Upper Arm Bike)  Level 1x 6 minutes (3/3)   verbal cues for posture     Neck Exercises: Supine   Shoulder Flexion  Both;20 reps    Shoulder Flexion Limitations  red band on foam roll    Other Supine Exercise  horizontal abduction and ER  2x10 bil   on foam roll     Lumbar Exercises: Stretches   Single Knee to Chest Stretch  Left;Right;3 reps;20 seconds      Lumbar Exercises: Seated   Other Seated Lumbar Exercises  3 way raises: 1# with cueing for posture 2x10 each.      Lumbar  Exercises: Supine   Bridge with Ball Squeeze  20 reps;5 seconds      Manual Therapy   Manual Therapy  Soft tissue mobilization;Myofascial release    Manual therapy comments  elongation and release to Rt lumbar, quadratus and gluteals               PT Short Term Goals - 07/12/19 1407      PT SHORT TERM GOAL #1   Title  be independent in initial HEP    Status  Achieved      PT SHORT TERM GOAL #2   Title  report a 30% reduction in thoracic and cervical pain with standing and turning head    Status  Achieved      PT SHORT TERM GOAL #3   Title  demonstrate Rt cervical rotation to > or = to 60 degrees to improve safety with driving    Baseline  80 degrees    Time  --    Period  --    Status  Achieved        PT Long Term Goals - 07/19/19 1406      PT LONG TERM GOAL #5   Title  verbalize and demonstrate understanding of postural alignment and exercises to improve postural endurance    Status  Achieved            Plan - 07/21/19 1420    Clinical Impression Statement  Pt with continued Rt sided lumbar pain up to 5/10 at night.  Pt is independent and compliant in HEP for flexibility and postural/core strength.  Pt advanced to supine band exercises on the foam roll to incorporate more core contraction.  Pt demonstrated mild core instability with these exercises.  Pt with tension and trigger points over Rt lumbar paraspinals, quadratus and gluteals and demonstrated improved tissue mobility after manual therapy today.  Pt will continue to benefit from skilled PT for postural and core strength, flexibility and manual therapy to address pain.    PT Frequency  2x / week    PT Duration  8 weeks    PT Treatment/Interventions  ADLs/Self Care Home Management;Cryotherapy;Electrical Stimulation;Therapeutic activities;Therapeutic exercise;Neuromuscular re-education;Manual techniques;Passive range of motion;Dry needling;Taping;Spinal Manipulations    PT Next Visit Plan  Pt would like to  D/C after next session.    PT Home Exercise Plan  Access Code: [email or web address]    Consulted and Agree with Plan of Care  Patient       Patient will benefit from skilled therapeutic intervention in order to improve the following deficits and impairments:  Decreased activity tolerance, Decreased endurance, Decreased range of motion, Decreased strength, Impaired flexibility, Improper body mechanics  Visit Diagnosis: Cervicalgia  Pain in thoracic spine  Cramp and spasm  Chronic bilateral low back pain without sciatica     Problem List Patient Active Problem List   Diagnosis Date Noted  . Back pain 06/15/2019  . Neck pain 06/15/2019  . Symptomatic mammary hypertrophy 06/15/2019  . Vitamin D deficiency 05/25/2019  . Osteopenia 06/02/2016  . Hyperlipidemia 11/14/2014  . Anxiety state, unspecified 09/20/2013  . Medication management 04/09/2013  . Elevated blood pressure reading 09/18/2012  . H/O cholesteatoma   . Visit for preventive health examination 09/22/2011  . HYPERLIPIDEMIA 05/01/2010  . VAGINITIS, ATROPHIC, POSTMENOPAUSAL 02/28/2009  . URINALYSIS, ABNORMAL 02/28/2009  . ADVERSE REACTION TO MEDICATION 02/28/2009  . DECREASED HEARING 07/01/2008  . HAND PAIN, BILATERAL 07/01/2008  . Insomnia 07/01/2008    Sigurd Sos, PT 07/21/19 2:45 PM PHYSICAL THERAPY DISCHARGE SUMMARY  Visits from Start of Care: 6  Current functional level related to goals / functional outcomes: Pt attended 6 PT sessions and didn't return to PT.  See above for current status.     Remaining deficits: See above for most current PT status.     Education / Equipment: HEP, posture/body mechanics Plan: Patient agrees to discharge.  Patient goals were partially met. Patient is being discharged due to not returning since the last visit.  ?????        Sigurd Sos, PT 09/13/19 8:30 AM  [facility] [address], [place], [place],  [address] Phone: [phone]   Fax:  [phone]  Name: Tracy Pena MRN: [id number] Date of Birth: Oct 17, 1951

## 2019-07-28 ENCOUNTER — Ambulatory Visit: Payer: Medicare Other

## 2019-08-10 DIAGNOSIS — Z23 Encounter for immunization: Secondary | ICD-10-CM | POA: Diagnosis not present

## 2019-11-18 ENCOUNTER — Ambulatory Visit: Payer: Medicare Other | Attending: Internal Medicine

## 2019-11-18 DIAGNOSIS — Z23 Encounter for immunization: Secondary | ICD-10-CM | POA: Insufficient documentation

## 2019-11-18 NOTE — Progress Notes (Signed)
   Covid-19 Vaccination Clinic  Name:  Tracy Pena    MRN: [id number] DOB: 14-May-1951  11/18/2019  [name] was observed post Covid-19 immunization for 15 minutes without incidence. She was provided with Vaccine Information Sheet and instruction to access the V-Safe system.   [name] was instructed to call 911 with any severe reactions post vaccine: Marland Kitchen Difficulty breathing  . Swelling of your face and throat  . A fast heartbeat  . A bad rash all over your body  . Dizziness and weakness    Immunizations Administered    Name Date Dose VIS Date Route   Pfizer COVID-19 Vaccine 11/18/2019  3:30 PM 0.3 mL 10/08/2019 Intramuscular   Manufacturer: [place]   Lot: [id number]   [place]: [id number]

## 2019-11-19 ENCOUNTER — Other Ambulatory Visit: Payer: Self-pay | Admitting: Internal Medicine

## 2019-11-19 DIAGNOSIS — G4700 Insomnia, unspecified: Secondary | ICD-10-CM

## 2019-12-02 ENCOUNTER — Ambulatory Visit: Payer: Medicare Other

## 2019-12-09 ENCOUNTER — Ambulatory Visit: Payer: Medicare Other | Attending: Internal Medicine

## 2019-12-09 DIAGNOSIS — Z23 Encounter for immunization: Secondary | ICD-10-CM | POA: Insufficient documentation

## 2019-12-09 NOTE — Progress Notes (Signed)
   Covid-19 Vaccination Clinic  Name:  Tracy Pena    MRN: [id number] DOB: 1950-11-02  12/09/2019  [name] was observed post Covid-19 immunization for 15 minutes without incidence. She was provided with Vaccine Information Sheet and instruction to access the V-Safe system.   Ms. Billing was instructed to call 911 with any severe reactions post vaccine: Marland Kitchen Difficulty breathing  . Swelling of your face and throat  . A fast heartbeat  . A bad rash all over your body  . Dizziness and weakness    Immunizations Administered    Name Date Dose VIS Date Route   Pfizer COVID-19 Vaccine 12/09/2019  4:04 PM 0.3 mL 10/08/2019 Intramuscular   Manufacturer: [place]   Lot: [id number]   [place]: [id number]

## 2020-03-07 ENCOUNTER — Other Ambulatory Visit: Payer: Self-pay | Admitting: Internal Medicine

## 2020-03-07 DIAGNOSIS — Z1231 Encounter for screening mammogram for malignant neoplasm of breast: Secondary | ICD-10-CM

## 2020-03-15 ENCOUNTER — Other Ambulatory Visit: Payer: Self-pay

## 2020-03-15 ENCOUNTER — Ambulatory Visit
Admission: RE | Admit: 2020-03-15 | Discharge: 2020-03-15 | Disposition: A | Discharge status: Home / Self Care | Payer: Medicare Other | Source: Ambulatory Visit | Attending: Internal Medicine | Admitting: Internal Medicine

## 2020-03-15 DIAGNOSIS — Z1231 Encounter for screening mammogram for malignant neoplasm of breast: Secondary | ICD-10-CM

## 2020-04-18 ENCOUNTER — Other Ambulatory Visit: Payer: Self-pay | Admitting: Internal Medicine

## 2020-04-18 DIAGNOSIS — G4700 Insomnia, unspecified: Secondary | ICD-10-CM

## 2020-05-09 ENCOUNTER — Telehealth: Payer: Self-pay | Admitting: Internal Medicine

## 2020-05-09 NOTE — Telephone Encounter (Signed)
Pt called in [place] and wanted to get advise as what to do next.  Pt was transferred to the triage nurse.

## 2020-05-10 NOTE — Telephone Encounter (Signed)
Spoke with patient and the tick was not attached.  Advised patient to watch the area and call back if any changes, or body aches with fever.

## 2020-05-10 NOTE — Telephone Encounter (Signed)
How long was tick attached? If more than 24 hours or unknown would consider prophylaxis with doxycycline 100 mg BID for 14 days.

## 2020-06-07 ENCOUNTER — Other Ambulatory Visit: Payer: Self-pay

## 2020-06-07 ENCOUNTER — Other Ambulatory Visit: Payer: Self-pay | Admitting: Internal Medicine

## 2020-06-07 ENCOUNTER — Encounter: Payer: Self-pay | Admitting: Internal Medicine

## 2020-06-07 ENCOUNTER — Ambulatory Visit (INDEPENDENT_AMBULATORY_CARE_PROVIDER_SITE_OTHER): Payer: Medicare Other | Admitting: Internal Medicine

## 2020-06-07 VITALS — BP 120/78 | HR 72 | Temp 98.2°F | Ht 63.0 in | Wt 148.4 lb

## 2020-06-07 DIAGNOSIS — Z23 Encounter for immunization: Secondary | ICD-10-CM | POA: Diagnosis not present

## 2020-06-07 DIAGNOSIS — Z0000 Encounter for general adult medical examination without abnormal findings: Secondary | ICD-10-CM | POA: Diagnosis not present

## 2020-06-07 DIAGNOSIS — E785 Hyperlipidemia, unspecified: Secondary | ICD-10-CM

## 2020-06-07 DIAGNOSIS — G4700 Insomnia, unspecified: Secondary | ICD-10-CM | POA: Diagnosis not present

## 2020-06-07 DIAGNOSIS — E559 Vitamin D deficiency, unspecified: Secondary | ICD-10-CM

## 2020-06-07 NOTE — Addendum Note (Signed)
Addended by: Westley Hummer B on: 06/07/2020 10:50 AM   Modules accepted: Orders

## 2020-06-07 NOTE — Patient Instructions (Signed)
-Nice seeing you today!!  -Lab work today; will notify you once results are available.  -Pneumonia vaccine (Prevnar) today.  -Schedule follow up in 1 year or sooner as needed.   Preventive Care 69 Years and Older, Female Preventive care refers to lifestyle choices and visits with your health care provider that can promote health and wellness. This includes:  A yearly physical exam. This is also called an annual well check.  Regular dental and eye exams.  Immunizations.  Screening for certain conditions.  Healthy lifestyle choices, such as diet and exercise. What can I expect for my preventive care visit? Physical exam Your health care provider will check:  Height and weight. These may be used to calculate body mass index (BMI), which is a measurement that tells if you are at a healthy weight.  Heart rate and blood pressure.  Your skin for abnormal spots. Counseling Your health care provider may ask you questions about:  Alcohol, tobacco, and drug use.  Emotional well-being.  Home and relationship well-being.  Sexual activity.  Eating habits.  History of falls.  Memory and ability to understand (cognition).  Work and work Statistician.  Pregnancy and menstrual history. What immunizations do I need?  Influenza (flu) vaccine  This is recommended every year. Tetanus, diphtheria, and pertussis (Tdap) vaccine  You may need a Td booster every 10 years. Varicella (chickenpox) vaccine  You may need this vaccine if you have not already been vaccinated. Zoster (shingles) vaccine  You may need this after age 69. Pneumococcal conjugate (PCV13) vaccine  One dose is recommended after age 69. Pneumococcal polysaccharide (PPSV23) vaccine  One dose is recommended after age 69. Measles, mumps, and rubella (MMR) vaccine  You may need at least one dose of MMR if you were born in 1957 or later. You may also need a second dose. Meningococcal conjugate (MenACWY)  vaccine  You may need this if you have certain conditions. Hepatitis A vaccine  You may need this if you have certain conditions or if you travel or work in places where you may be exposed to hepatitis A. Hepatitis B vaccine  You may need this if you have certain conditions or if you travel or work in places where you may be exposed to hepatitis B. Haemophilus influenzae type b (Hib) vaccine  You may need this if you have certain conditions. You may receive vaccines as individual doses or as more than one vaccine together in one shot (combination vaccines). Talk with your health care provider about the risks and benefits of combination vaccines. What tests do I need? Blood tests  Lipid and cholesterol levels. These may be checked every 5 years, or more frequently depending on your overall health.  Hepatitis C test.  Hepatitis B test. Screening  Lung cancer screening. You may have this screening every year starting at age 69 if you have a 30-pack-year history of smoking and currently smoke or have quit within the past 15 years.  Colorectal cancer screening. All adults should have this screening starting at age 69 and continuing until age 76. Your health care provider may recommend screening at age 69 if you are at increased risk. You will have tests every 1-10 years, depending on your results and the type of screening test.  Diabetes screening. This is done by checking your blood sugar (glucose) after you have not eaten for a while (fasting). You may have this done every 1-3 years.  Mammogram. This may be done every 1-2 years. Talk with  your health care provider about how often you should have regular mammograms.  BRCA-related cancer screening. This may be done if you have a family history of breast, ovarian, tubal, or peritoneal cancers. Other tests  Sexually transmitted disease (STD) testing.  Bone density scan. This is done to screen for osteoporosis. You may have this done  starting at age 69. Follow these instructions at home: Eating and drinking  Eat a diet that includes fresh fruits and vegetables, whole grains, lean protein, and low-fat dairy products. Limit your intake of foods with high amounts of sugar, saturated fats, and salt.  Take vitamin and mineral supplements as recommended by your health care provider.  Do not drink alcohol if your health care provider tells you not to drink.  If you drink alcohol: ? Limit how much you have to 0-1 drink a day. ? Be aware of how much alcohol is in your drink. In the [place] one drink equals one 12 oz bottle of beer (355 mL), one 5 oz glass of wine (148 mL), or one 1 oz glass of hard liquor (44 mL). Lifestyle  Take daily care of your teeth and gums.  Stay active. Exercise for at least 30 minutes on 5 or more days each week.  Do not use any products that contain nicotine or tobacco, such as cigarettes, e-cigarettes, and chewing tobacco. If you need help quitting, ask your health care provider.  If you are sexually active, practice safe sex. Use a condom or other form of protection in order to prevent STIs (sexually transmitted infections).  Talk with your health care provider about taking a low-dose aspirin or statin. What's next?  Go to your health care provider once a year for a well check visit.  Ask your health care provider how often you should have your eyes and teeth checked.  Stay up to date on all vaccines. This information is not intended to replace advice given to you by your health care provider. Make sure you discuss any questions you have with your health care provider. Document Revised: 10/08/2018 Document Reviewed: 10/08/2018 Elsevier Patient Education  2020 [employer].

## 2020-06-07 NOTE — Progress Notes (Signed)
Established Patient Office Visit     This visit occurred during the SARS-CoV-2 public health emergency.  Safety protocols were in place, including screening questions prior to the visit, additional usage of staff PPE, and extensive cleaning of exam room while observing appropriate contact time as indicated for disinfecting solutions.    CC/Reason for Visit: Subsequent Medicare wellness visit and annual follow-up chronic conditions  HPI: Tracy Pena is a 69 y.o. female who is coming in today for the above mentioned reasons. Past Medical History is significant for: Hyperlipidemia not on medications, vitamin D deficiency, insomnia on trazodone.  She has had no acute issues since last visit.  She has completed both Covid vaccines.  She had a Pap smear in 2020.  She has routine eye and dental care.  She exercises by walking 3 miles every day, she wears hearing aids.  She had a mammogram earlier this year and a colonoscopy in 2012.   Past Medical/Surgical History: Past Medical History:  Diagnosis Date  . Decreased hearing   . H/O cholesteatoma    surgery [facility]  . Hx of varicella    as a child    Past Surgical History:  Procedure Laterality Date  . INNER EAR SURGERY     at [facility] for Cholesteatoma artificial ear drum  . REPEAT CESAREAN SECTION     had 3    Social History:  reports that she has never smoked. She has never used smokeless tobacco. She reports current alcohol use of about 8.0 standard drinks of alcohol per week. She reports that she does not use drugs.  Allergies: No Known Allergies  Family History:  Family History  Problem Relation Age of Onset  . Lung cancer Mother   . Thyroid disease Daughter        graves  . Celiac disease Grandchild   . Diabetes type I Neg Hx      Current Outpatient Medications:  .  Ibuprofen 200 MG CAPS, Take 1 capsule by mouth as needed.  , Disp: , Rfl:  .  traZODone (DESYREL) 50 MG tablet, TAKE 1/2 TO 1 TABLET BY MOUTH AT  BEDTIME AS NEEDED FOR SLEEP, Disp: 90 tablet, Rfl: 0  Review of Systems:  Constitutional: Denies fever, chills, diaphoresis, appetite change and fatigue.  HEENT: Denies photophobia, eye pain, redness, hearing loss, ear pain, congestion, sore throat, rhinorrhea, sneezing, mouth sores, trouble swallowing, neck pain, neck stiffness and tinnitus.   Respiratory: Denies SOB, DOE, cough, chest tightness,  and wheezing.   Cardiovascular: Denies chest pain, palpitations and leg swelling.  Gastrointestinal: Denies nausea, vomiting, abdominal pain, diarrhea, constipation, blood in stool and abdominal distention.  Genitourinary: Denies dysuria, urgency, frequency, hematuria, flank pain and difficulty urinating.  Endocrine: Denies: hot or cold intolerance, sweats, changes in hair or nails, polyuria, polydipsia. Musculoskeletal: Denies myalgias, back pain, joint swelling, arthralgias and gait problem.  Skin: Denies pallor, rash and wound.  Neurological: Denies dizziness, seizures, syncope, weakness, light-headedness, numbness and headaches.  Hematological: Denies adenopathy. Easy bruising, personal or family bleeding history  Psychiatric/Behavioral: Denies suicidal ideation, mood changes, confusion, nervousness, sleep disturbance and agitation    Physical Exam: Vitals:   06/07/20 0741  BP: 120/78  Pulse: 72  Temp: 98.2 F (36.8 C)  TempSrc: Oral  SpO2: 96%  Weight: 148 lb 6.4 oz (67.3 kg)  Height: 5' 3"  (1.6 m)    Body mass index is 26.29 kg/m.   Constitutional: NAD, calm, comfortable Eyes: PERRL, lids and conjunctivae normal  ENMT: Mucous membranes are moist.Tympanic membrane is pearly white, no erythema or bulging. Neck: normal, supple, no masses, no thyromegaly Respiratory: clear to auscultation bilaterally, no wheezing, no crackles. Normal respiratory effort. No accessory muscle use.  Cardiovascular: Regular rate and rhythm, no murmurs / rubs / gallops. No extremity edema. 2+ pedal  pulses.  Abdomen: no tenderness, no masses palpated. No hepatosplenomegaly. Bowel sounds positive.  Musculoskeletal: no clubbing / cyanosis. No joint deformity upper and lower extremities. Good ROM, no contractures. Normal muscle tone.  Skin: no rashes, lesions, ulcers. No induration Neurologic: CN 2-12 grossly intact. Sensation intact, DTR normal. Strength 5/5 in all 4.  Psychiatric: Normal judgment and insight. Alert and oriented x 3. Normal mood.    Subsequent Medicare wellness visit   1. Risk factors, based on past  M,S,F -cardiovascular disease risk factors include age, history of hyperlipidemia   2.  Physical activities: Walks 3 miles every day   3.  Depression/mood:  Stable, not depressed   4.  Hearing:  Good audition with hearing aids bilaterally   5.  ADL's: Independent in all ADLs   6.  Fall risk:  Low fall risk   7.  Home safety: No problems identified   8.  Height weight, and visual acuity: Height and weight as above, visual acuity is 20/25 on the left, 20/20 on the right and 20/20 with eyes together   9.  Counseling:  Continue strides towards a healthy lifestyle   10. Lab orders based on risk factors: Laboratory update will be reviewed   11. Referral :  None today   12. Care plan:  Follow-up in 1 year   13. Cognitive assessment:  No cognitive impairment   14. Screening: Patient provided with a written and personalized 5-10 year screening schedule in the AVS.   yes   15. Provider List Update:   PCP only  16. Advance Directives: Full code     Office Visit from 06/07/2020 in [facility] [place]  PHQ-9 Total Score 1      Fall Risk  06/07/2020 05/21/2019 08/25/2018 06/25/2017 05/17/2016  Falls in the past year? 0 0 No No No  Number falls in past yr: 0 0 - - -  Injury with Fall? 0 0 - - -     Impression and Plan:  Encounter for subsequent annual wellness visit (AWV) in Medicare patient -She has routine eye and dental care. -She will receive  Prevnar today, otherwise immunizations are up-to-date. -Screening labs today. -Healthy lifestyle discussed in detail. - colonoscopy in 2012, 10-year callback -Mammogram was negative in May 2021. -Negative Pap smear in 2020. -She will be due for bone density next year.  Vitamin D deficiency  - Plan: VITAMIN D 25 Hydroxy (Vit-D Deficiency, Fractures)  Hyperlipidemia, unspecified hyperlipidemia type  -Recheck lipids, not currently on medications.   Patient Instructions  -Nice seeing you today!!  -Lab work today; will notify you once results are available.  -Pneumonia vaccine (Prevnar) today.  -Schedule follow up in 1 year or sooner as needed.   Preventive Care 27 Years and Older, Female Preventive care refers to lifestyle choices and visits with your health care provider that can promote health and wellness. This includes:  A yearly physical exam. This is also called an annual well check.  Regular dental and eye exams.  Immunizations.  Screening for certain conditions.  Healthy lifestyle choices, such as diet and exercise. What can I expect for my preventive care visit? Physical exam Your health care  provider will check:  Height and weight. These may be used to calculate body mass index (BMI), which is a measurement that tells if you are at a healthy weight.  Heart rate and blood pressure.  Your skin for abnormal spots. Counseling Your health care provider may ask you questions about:  Alcohol, tobacco, and drug use.  Emotional well-being.  Home and relationship well-being.  Sexual activity.  Eating habits.  History of falls.  Memory and ability to understand (cognition).  Work and work Statistician.  Pregnancy and menstrual history. What immunizations do I need?  Influenza (flu) vaccine  This is recommended every year. Tetanus, diphtheria, and pertussis (Tdap) vaccine  You may need a Td booster every 10 years. Varicella (chickenpox)  vaccine  You may need this vaccine if you have not already been vaccinated. Zoster (shingles) vaccine  You may need this after age 59. Pneumococcal conjugate (PCV13) vaccine  One dose is recommended after age 77. Pneumococcal polysaccharide (PPSV23) vaccine  One dose is recommended after age 35. Measles, mumps, and rubella (MMR) vaccine  You may need at least one dose of MMR if you were born in 1957 or later. You may also need a second dose. Meningococcal conjugate (MenACWY) vaccine  You may need this if you have certain conditions. Hepatitis A vaccine  You may need this if you have certain conditions or if you travel or work in places where you may be exposed to hepatitis A. Hepatitis B vaccine  You may need this if you have certain conditions or if you travel or work in places where you may be exposed to hepatitis B. Haemophilus influenzae type b (Hib) vaccine  You may need this if you have certain conditions. You may receive vaccines as individual doses or as more than one vaccine together in one shot (combination vaccines). Talk with your health care provider about the risks and benefits of combination vaccines. What tests do I need? Blood tests  Lipid and cholesterol levels. These may be checked every 5 years, or more frequently depending on your overall health.  Hepatitis C test.  Hepatitis B test. Screening  Lung cancer screening. You may have this screening every year starting at age 46 if you have a 30-pack-year history of smoking and currently smoke or have quit within the past 15 years.  Colorectal cancer screening. All adults should have this screening starting at age 76 and continuing until age 54. Your health care provider may recommend screening at age 32 if you are at increased risk. You will have tests every 1-10 years, depending on your results and the type of screening test.  Diabetes screening. This is done by checking your blood sugar (glucose) after you  have not eaten for a while (fasting). You may have this done every 1-3 years.  Mammogram. This may be done every 1-2 years. Talk with your health care provider about how often you should have regular mammograms.  BRCA-related cancer screening. This may be done if you have a family history of breast, ovarian, tubal, or peritoneal cancers. Other tests  Sexually transmitted disease (STD) testing.  Bone density scan. This is done to screen for osteoporosis. You may have this done starting at age 2. Follow these instructions at home: Eating and drinking  Eat a diet that includes fresh fruits and vegetables, whole grains, lean protein, and low-fat dairy products. Limit your intake of foods with high amounts of sugar, saturated fats, and salt.  Take vitamin and mineral supplements as recommended  by your health care provider.  Do not drink alcohol if your health care provider tells you not to drink.  If you drink alcohol: ? Limit how much you have to 0-1 drink a day. ? Be aware of how much alcohol is in your drink. In the [place] one drink equals one 12 oz bottle of beer (355 mL), one 5 oz glass of wine (148 mL), or one 1 oz glass of hard liquor (44 mL). Lifestyle  Take daily care of your teeth and gums.  Stay active. Exercise for at least 30 minutes on 5 or more days each week.  Do not use any products that contain nicotine or tobacco, such as cigarettes, e-cigarettes, and chewing tobacco. If you need help quitting, ask your health care provider.  If you are sexually active, practice safe sex. Use a condom or other form of protection in order to prevent STIs (sexually transmitted infections).  Talk with your health care provider about taking a low-dose aspirin or statin. What's next?  Go to your health care provider once a year for a well check visit.  Ask your health care provider how often you should have your eyes and teeth checked.  Stay up to date on all vaccines. This  information is not intended to replace advice given to you by your health care provider. Make sure you discuss any questions you have with your health care provider. Document Revised: 10/08/2018 Document Reviewed: 10/08/2018 Elsevier Patient Education  2020 [place], [place] [facility]

## 2020-06-08 LAB — COMPREHENSIVE METABOLIC PANEL
AG Ratio: 2 (calc) (ref 1.0–2.5)
ALT: 16 U/L (ref 6–29)
AST: 19 U/L (ref 10–35)
Albumin: 4.2 g/dL (ref 3.6–5.1)
Alkaline phosphatase (APISO): 65 U/L (ref 37–153)
BUN: 20 mg/dL (ref 7–25)
CO2: 28 mmol/L (ref 20–32)
Calcium: 9.9 mg/dL (ref 8.6–10.4)
Chloride: 107 mmol/L (ref 98–110)
Creat: 0.99 mg/dL (ref 0.50–0.99)
Globulin: 2.1 g/dL (calc) (ref 1.9–3.7)
Glucose, Bld: 95 mg/dL (ref 65–99)
Potassium: 4.3 mmol/L (ref 3.5–5.3)
Sodium: 141 mmol/L (ref 135–146)
Total Bilirubin: 0.4 mg/dL (ref 0.2–1.2)
Total Protein: 6.3 g/dL (ref 6.1–8.1)

## 2020-06-08 LAB — CBC WITH DIFFERENTIAL/PLATELET
Absolute Monocytes: 311 cells/uL (ref 200–950)
Basophils Absolute: 29 cells/uL (ref 0–200)
Basophils Relative: 0.7 %
Eosinophils Absolute: 382 cells/uL (ref 15–500)
Eosinophils Relative: 9.1 %
HCT: 36.8 % (ref 35.0–45.0)
Hemoglobin: 12.4 g/dL (ref 11.7–15.5)
Lymphs Abs: 1751 cells/uL (ref 850–3900)
MCH: 30.9 pg (ref 27.0–33.0)
MCHC: 33.7 g/dL (ref 32.0–36.0)
MCV: 91.8 fL (ref 80.0–100.0)
MPV: 10.3 fL (ref 7.5–12.5)
Monocytes Relative: 7.4 %
Neutro Abs: 1726 cells/uL (ref 1500–7800)
Neutrophils Relative %: 41.1 %
Platelets: 266 10*3/uL (ref 140–400)
RBC: 4.01 10*6/uL (ref 3.80–5.10)
RDW: 13.4 % (ref 11.0–15.0)
Total Lymphocyte: 41.7 %
WBC: 4.2 10*3/uL (ref 3.8–10.8)

## 2020-06-08 LAB — LIPID PANEL
Cholesterol: 212 mg/dL — ABNORMAL HIGH (ref <200)
HDL: 70 mg/dL (ref >=50)
LDL Cholesterol (Calc): 118 mg/dL (calc) — ABNORMAL HIGH
Non-HDL Cholesterol (Calc): 142 mg/dL (calc) — ABNORMAL HIGH (ref <130)
Total CHOL/HDL Ratio: 3 (calc) (ref <5.0)
Triglycerides: 128 mg/dL (ref <150)

## 2020-06-08 LAB — VITAMIN D 25 HYDROXY (VIT D DEFICIENCY, FRACTURES): Vit D, 25-Hydroxy: 57 ng/mL (ref 30–100)

## 2020-06-09 DIAGNOSIS — Z03818 Encounter for observation for suspected exposure to other biological agents ruled out: Secondary | ICD-10-CM | POA: Diagnosis not present

## 2020-06-09 DIAGNOSIS — Z20828 Contact with and (suspected) exposure to other viral communicable diseases: Secondary | ICD-10-CM | POA: Diagnosis not present

## 2020-07-24 ENCOUNTER — Other Ambulatory Visit: Payer: Self-pay | Admitting: Internal Medicine

## 2020-07-24 DIAGNOSIS — G4700 Insomnia, unspecified: Secondary | ICD-10-CM

## 2020-09-12 DIAGNOSIS — Z23 Encounter for immunization: Secondary | ICD-10-CM | POA: Diagnosis not present

## 2020-10-03 ENCOUNTER — Ambulatory Visit (INDEPENDENT_AMBULATORY_CARE_PROVIDER_SITE_OTHER): Payer: Medicare Other | Admitting: Plastic Surgery

## 2020-10-03 ENCOUNTER — Encounter: Payer: Self-pay | Admitting: Plastic Surgery

## 2020-10-03 ENCOUNTER — Other Ambulatory Visit: Payer: Self-pay

## 2020-10-03 VITALS — BP 132/69 | HR 78 | Temp 97.8°F | Ht 63.5 in | Wt 144.0 lb

## 2020-10-03 DIAGNOSIS — G8929 Other chronic pain: Secondary | ICD-10-CM

## 2020-10-03 DIAGNOSIS — M546 Pain in thoracic spine: Secondary | ICD-10-CM

## 2020-10-03 DIAGNOSIS — N62 Hypertrophy of breast: Secondary | ICD-10-CM

## 2020-10-03 DIAGNOSIS — M542 Cervicalgia: Secondary | ICD-10-CM | POA: Diagnosis not present

## 2020-10-03 NOTE — Progress Notes (Signed)
   Subjective:    Patient ID: Tracy Pena, female    DOB: Nov 23, 1950, 69 y.o.   MRN: [id number]  The patient is a 69 year old white female here for further evaluation of her mammary hyperplasia.  She complains of very large breasts that create back pain and neck pain.  She has been through a full session of physical therapy.  The patient states that this has not improved any of her pain.  She describes her breast size as a 34 DDD.  She is 5 feet 3 inches tall and weighs 144 pounds.  The sternal notch to nipple distance on the right is 29 cm and on the left 29 cm.  The inframammary fold distance is 14 cm.  She has not had any health changes.  She had her mammogram 6 months ago and it was negative.  She is not a smoker and does not have diabetes.  She complains of having to pin her bra straps up to get better relief and finds relief by actually holding her breasts up.  Sometimes she has to use padding in her bra straps to prevent the indentations.  She has tried Motrin and Tylenol for pain relief which only helps temporarily.  She finds exercising difficult due to the excess weight of her breasts.  The estimated amount for removal on the right is 385 g and 385 g on the left breast     Review of Systems  Constitutional: Negative.  Negative for appetite change.  HENT: Negative.   Eyes: Negative.   Respiratory: Negative.  Negative for chest tightness and shortness of breath.   Cardiovascular: Negative for leg swelling.  Gastrointestinal: Negative for abdominal distention and abdominal pain.  Endocrine: Negative.   Genitourinary: Negative.   Musculoskeletal: Positive for back pain and neck pain.  Hematological: Negative.   Psychiatric/Behavioral: Negative.        Objective:   Physical Exam Nursing note reviewed.  Constitutional:      Appearance: Normal appearance.  HENT:     Head: Normocephalic and atraumatic.  Eyes:     Extraocular Movements: Extraocular movements intact.    Cardiovascular:     Rate and Rhythm: Normal rate.  Pulmonary:     Effort: Pulmonary effort is normal. No respiratory distress.     Breath sounds: No wheezing.  Abdominal:     General: Abdomen is flat. There is no distension.  Skin:    Capillary Refill: Capillary refill takes less than 2 seconds.  Neurological:     General: No focal deficit present.     Mental Status: She is alert and oriented to person, place, and time.  Psychiatric:        Mood and Affect: Mood normal.        Behavior: Behavior normal.         Assessment & Plan:     ICD-10-CM   1. Symptomatic mammary hypertrophy  N62   2. Neck pain  M54.2   3. Chronic bilateral thoracic back pain  M54.6    G89.29     The patient is a good candidate for bilateral breast reduction.  We talked about the surgery and expectations.  She would like to proceed with submission to insurance.  Pictures were obtained of the patient and placed in the chart with the patient's or guardian's permission.

## 2020-10-25 ENCOUNTER — Other Ambulatory Visit: Payer: Self-pay | Admitting: Internal Medicine

## 2020-10-25 DIAGNOSIS — G4700 Insomnia, unspecified: Secondary | ICD-10-CM

## 2020-11-07 ENCOUNTER — Encounter: Payer: Self-pay | Admitting: Plastic Surgery

## 2020-11-07 ENCOUNTER — Ambulatory Visit (INDEPENDENT_AMBULATORY_CARE_PROVIDER_SITE_OTHER): Payer: Medicare Other | Admitting: Plastic Surgery

## 2020-11-07 ENCOUNTER — Other Ambulatory Visit: Payer: Self-pay

## 2020-11-07 VITALS — BP 142/90 | HR 92 | Temp 97.5°F | Ht 63.5 in | Wt 145.0 lb

## 2020-11-07 DIAGNOSIS — M542 Cervicalgia: Secondary | ICD-10-CM

## 2020-11-07 DIAGNOSIS — M546 Pain in thoracic spine: Secondary | ICD-10-CM

## 2020-11-07 DIAGNOSIS — N62 Hypertrophy of breast: Secondary | ICD-10-CM

## 2020-11-07 DIAGNOSIS — G8929 Other chronic pain: Secondary | ICD-10-CM

## 2020-11-07 MED ORDER — ACETAMINOPHEN 500 MG PO TABS
500.0000 mg | ORAL_TABLET | Freq: Four times a day (QID) | ORAL | 0 refills | Status: DC | PRN
Start: 2020-11-07 — End: 2022-02-19

## 2020-11-07 MED ORDER — CEPHALEXIN 500 MG PO CAPS: 500.0000 mg | ORAL_CAPSULE | Freq: Four times a day (QID) | ORAL | 0 refills | Status: AC

## 2020-11-07 MED ORDER — IBUPROFEN 600 MG PO TABS
600.0000 mg | ORAL_TABLET | Freq: Four times a day (QID) | ORAL | 0 refills | Status: DC | PRN
Start: 2020-11-07 — End: 2022-01-01

## 2020-11-07 MED ORDER — HYDROCODONE-ACETAMINOPHEN 5-325 MG PO TABS: 1.0000 | ORAL_TABLET | Freq: Three times a day (TID) | ORAL | 0 refills | Status: AC | PRN

## 2020-11-07 MED ORDER — ONDANSETRON 4 MG PO TBDP
4.0000 mg | ORAL_TABLET | Freq: Three times a day (TID) | ORAL | 0 refills | Status: DC | PRN
Start: 2020-11-07 — End: 2020-12-22

## 2020-11-07 NOTE — H&P (View-Only) (Signed)
ICD-10-CM   1. Symptomatic mammary hypertrophy  N62   2. Neck pain  M54.2   3. Chronic bilateral thoracic back pain  M54.6    G89.29       Patient ID: Tracy Pena, female    DOB: [birth year], 70 y.o.   MRN: [id number]   History of Present Illness: Tracy Pena is a 70 y.o.  female  with a history of mammary hyperplasia.  She presents for preoperative evaluation for upcoming procedure, bilateral breast reduction with liposuction, scheduled for 12/06/2020 with Dr. Marla Roe.  Summary from previous visit: Patient has large breasts that create back pain and neck pain.  She has been through physical therapy and states that it has not removed her pain.  She reports she is a 34 DDD.  She is 5 feet 3 inches tall and weighs 144 pounds.  Sternal notch to nipple distance is 29 cm bilaterally.  IMF fold distance is 14 cm.  She is not a smoker and does not have diabetes.  The estimated excess breast tissue to be removed at time of surgery is 385 g from each side.  Job: N/A  PMH Significant for: H/O Cholesteatoma - artificial eardrum surgery done at [facility].  The patient has had problems with anesthesia. Nausea.  Would like to try the scop patch  Past Medical History: Allergies: No Known Allergies  Current Medications:  Current Outpatient Medications:  .  Ibuprofen 200 MG CAPS, Take 1 capsule by mouth as needed.  , Disp: , Rfl:  .  traZODone (DESYREL) 50 MG tablet, TAKE HALF TO ONE TABLET BY MOUTH AT BEDTIME AS NEEDED FOR SLEEP, Disp: 90 tablet, Rfl: 0  Past Medical Problems: Past Medical History:  Diagnosis Date  . Decreased hearing   . H/O cholesteatoma    surgery [facility]  . Hx of varicella    as a child    Past Surgical History: Past Surgical History:  Procedure Laterality Date  . INNER EAR SURGERY     at [facility] for Cholesteatoma artificial ear drum  . REPEAT CESAREAN SECTION     had 3    Social History: Social History   Socioeconomic History  . Marital status:  Married    Spouse name: Not on file  . Number of children: Not on file  . Years of education: Not on file  . Highest education level: Not on file  Occupational History  . Not on file  Tobacco Use  . Smoking status: Never Smoker  . Smokeless tobacco: Never Used  Vaping Use  . Vaping Use: Never used  Substance and Sexual Activity  . Alcohol use: Yes    Alcohol/week: 8.0 standard drinks    Types: 8 Glasses of wine per week  . Drug use: No  . Sexual activity: Not on file  Other Topics Concern  . Not on file  Social History Narrative   hh of 2    No tobacco  Few wine per weekend.    Pet dog    No ets.    Retired   Used to sleep 6-8 hours pm   Help take care of grandchild born in [place] on and off   Exercise walking.     Social Determinants of Health   Financial Resource Strain: Not on file  Food Insecurity: Not on file  Transportation Needs: Not on file  Physical Activity: Not on file  Stress: Not on file  Social Connections: Not on file  Intimate Partner  Violence: Not on file    Family History: Family History  Problem Relation Age of Onset  . Lung cancer Mother   . Thyroid disease Daughter        graves  . Celiac disease Grandchild   . Diabetes type I Neg Hx     Review of Systems: Review of Systems  Constitutional: Negative for chills and fever.  HENT: Negative for congestion and sore throat.   Respiratory: Negative for cough and shortness of breath.   Cardiovascular: Negative for chest pain and palpitations.  Gastrointestinal: Negative for abdominal pain, nausea and vomiting.  Musculoskeletal: Positive for back pain and neck pain.  Skin: Negative for itching and rash.    Physical Exam: Vital Signs BP (!) 142/90 (BP Location: Left Arm, Patient Position: Sitting, Cuff Size: Normal)   Pulse 92   Temp (!) 97.5 F (36.4 C) (Temporal)   Ht 5' 3.5" (1.613 m)   Wt 145 lb (65.8 kg)   SpO2 99%   BMI 25.28 kg/m  Physical Exam Vitals and nursing note reviewed.   Constitutional:      General: She is not in acute distress.    Appearance: Normal appearance. She is normal weight. She is not ill-appearing.  HENT:     Head: Normocephalic and atraumatic.  Eyes:     Extraocular Movements: Extraocular movements intact.  Cardiovascular:     Rate and Rhythm: Normal rate and regular rhythm.     Pulses: Normal pulses.     Heart sounds: Normal heart sounds.  Pulmonary:     Effort: Pulmonary effort is normal.     Breath sounds: Normal breath sounds. No wheezing, rhonchi or rales.  Abdominal:     General: Bowel sounds are normal.     Palpations: Abdomen is soft.  Musculoskeletal:        General: No swelling. Normal range of motion.     Cervical back: Normal range of motion.  Skin:    General: Skin is warm and dry.     Coloration: Skin is not pale.     Findings: No erythema or rash.  Neurological:     General: No focal deficit present.     Mental Status: She is alert and oriented to person, place, and time.  Psychiatric:        Mood and Affect: Mood normal.        Behavior: Behavior normal.        Thought Content: Thought content normal.        Judgment: Judgment normal.     Assessment/Plan:  Ms. Nolton scheduled for bilateral breast reduction with liposuction with Dr. Marla Roe.  Risks, benefits, and alternatives of procedure discussed, questions answered and consent obtained.    Smoking Status: Non-smoker; Counseling Given?  N/A Last Mammogram: 03/16/2020; Results: No mammographic evidence of malignancy  Caprini Score: Moderate; Risk Factors include: 70 year old female, BMI =25, and length of planned surgery. Recommendation for mechanical or pharmacological prophylaxis during surgery. Encourage early ambulation.   Pictures obtained: 10/03/20  Post-op Rx sent to pharmacy: Norco, Zofran, Keflex, IBU, Tylenol  Patient was provided with the Breast Reduction Risks and General Surgical Risk consent document and Pain Medication Agreement  prior to their appointment.  They had adequate time to read through the risk consent documents and Pain Medication Agreement. We also discussed them in person together during this preop appointment. All of their questions were answered to their satisfaction.  Recommended calling if they have any further questions.  Risk consent form  and Pain Medication Agreement to be scanned into patient's chart.  The risk that can be encountered with breast reduction were discussed and include the following but not limited to these:  Breast asymmetry, fluid accumulation, firmness of the breast, inability to breast feed, loss of nipple or areola, skin loss, decrease or no nipple sensation, fat necrosis of the breast tissue, bleeding, infection, healing delay.  There are risks of anesthesia, changes to skin sensation and injury to nerves or blood vessels.  The muscle can be temporarily or permanently injured.  You may have an allergic reaction to tape, suture, glue, blood products which can result in skin discoloration, swelling, pain, skin lesions, poor healing.  Any of these can lead to the need for revisonal surgery or stage procedures.  A reduction has potential to interfere with diagnostic procedures.  Nipple or breast piercing can increase risks of infection.  This procedure is best done when the breast is fully developed.  Changes in the breast will continue to occur over time.  Pregnancy can alter the outcomes of previous breast reduction surgery, weight gain and weigh loss can also effect the long term appearance.   Electronically signed by: Threasa Heads, PA-C 11/07/2020 2:39 PM

## 2020-11-07 NOTE — Progress Notes (Signed)
ICD-10-CM   1. Symptomatic mammary hypertrophy  N62   2. Neck pain  M54.2   3. Chronic bilateral thoracic back pain  M54.6    G89.29       Patient ID: Tracy Pena, female    DOB: 1950-12-02, 70 y.o.   MRN: [id number]   History of Present Illness: Tracy Pena is a 70 y.o.  female  with a history of mammary hyperplasia.  She presents for preoperative evaluation for upcoming procedure, bilateral breast reduction with liposuction, scheduled for 12/06/2020 with Dr. Marla Roe.  Summary from previous visit: Patient has large breasts that create back pain and neck pain.  She has been through physical therapy and states that it has not removed her pain.  She reports she is a 34 DDD.  She is 5 feet 3 inches tall and weighs 144 pounds.  Sternal notch to nipple distance is 29 cm bilaterally.  IMF fold distance is 14 cm.  She is not a smoker and does not have diabetes.  The estimated excess breast tissue to be removed at time of surgery is 385 g from each side.  Job: N/A  PMH Significant for: H/O Cholesteatoma - artificial eardrum surgery done at [facility].  The patient has had problems with anesthesia. Nausea.  Would like to try the scop patch  Past Medical History: Allergies: No Known Allergies  Current Medications:  Current Outpatient Medications:  .  Ibuprofen 200 MG CAPS, Take 1 capsule by mouth as needed.  , Disp: , Rfl:  .  traZODone (DESYREL) 50 MG tablet, TAKE HALF TO ONE TABLET BY MOUTH AT BEDTIME AS NEEDED FOR SLEEP, Disp: 90 tablet, Rfl: 0  Past Medical Problems: Past Medical History:  Diagnosis Date  . Decreased hearing   . H/O cholesteatoma    surgery [facility]  . Hx of varicella    as a child    Past Surgical History: Past Surgical History:  Procedure Laterality Date  . INNER EAR SURGERY     at [facility] for Cholesteatoma artificial ear drum  . REPEAT CESAREAN SECTION     had 3    Social History: Social History   Socioeconomic History  . Marital status:  Married    Spouse name: Not on file  . Number of children: Not on file  . Years of education: Not on file  . Highest education level: Not on file  Occupational History  . Not on file  Tobacco Use  . Smoking status: Never Smoker  . Smokeless tobacco: Never Used  Vaping Use  . Vaping Use: Never used  Substance and Sexual Activity  . Alcohol use: Yes    Alcohol/week: 8.0 standard drinks    Types: 8 Glasses of wine per week  . Drug use: No  . Sexual activity: Not on file  Other Topics Concern  . Not on file  Social History Narrative   hh of 2    No tobacco  Few wine per weekend.    Pet dog    No ets.    Retired   Used to sleep 6-8 hours pm   Help take care of grandchild born in [place] on and off   Exercise walking.     Social Determinants of Health   Financial Resource Strain: Not on file  Food Insecurity: Not on file  Transportation Needs: Not on file  Physical Activity: Not on file  Stress: Not on file  Social Connections: Not on file  Intimate Partner  Violence: Not on file    Family History: Family History  Problem Relation Age of Onset  . Lung cancer Mother   . Thyroid disease Daughter        graves  . Celiac disease Grandchild   . Diabetes type I Neg Hx     Review of Systems: Review of Systems  Constitutional: Negative for chills and fever.  HENT: Negative for congestion and sore throat.   Respiratory: Negative for cough and shortness of breath.   Cardiovascular: Negative for chest pain and palpitations.  Gastrointestinal: Negative for abdominal pain, nausea and vomiting.  Musculoskeletal: Positive for back pain and neck pain.  Skin: Negative for itching and rash.    Physical Exam: Vital Signs BP (!) 142/90 (BP Location: Left Arm, Patient Position: Sitting, Cuff Size: Normal)   Pulse 92   Temp (!) 97.5 F (36.4 C) (Temporal)   Ht 5' 3.5" (1.613 m)   Wt 145 lb (65.8 kg)   SpO2 99%   BMI 25.28 kg/m  Physical Exam Vitals and nursing note reviewed.   Constitutional:      General: She is not in acute distress.    Appearance: Normal appearance. She is normal weight. She is not ill-appearing.  HENT:     Head: Normocephalic and atraumatic.  Eyes:     Extraocular Movements: Extraocular movements intact.  Cardiovascular:     Rate and Rhythm: Normal rate and regular rhythm.     Pulses: Normal pulses.     Heart sounds: Normal heart sounds.  Pulmonary:     Effort: Pulmonary effort is normal.     Breath sounds: Normal breath sounds. No wheezing, rhonchi or rales.  Abdominal:     General: Bowel sounds are normal.     Palpations: Abdomen is soft.  Musculoskeletal:        General: No swelling. Normal range of motion.     Cervical back: Normal range of motion.  Skin:    General: Skin is warm and dry.     Coloration: Skin is not pale.     Findings: No erythema or rash.  Neurological:     General: No focal deficit present.     Mental Status: She is alert and oriented to person, place, and time.  Psychiatric:        Mood and Affect: Mood normal.        Behavior: Behavior normal.        Thought Content: Thought content normal.        Judgment: Judgment normal.     Assessment/Plan:  Ms. Nolton scheduled for bilateral breast reduction with liposuction with Dr. Marla Roe.  Risks, benefits, and alternatives of procedure discussed, questions answered and consent obtained.    Smoking Status: Non-smoker; Counseling Given?  N/A Last Mammogram: 03/16/2020; Results: No mammographic evidence of malignancy  Caprini Score: Moderate; Risk Factors include: 70 year old female, BMI =25, and length of planned surgery. Recommendation for mechanical or pharmacological prophylaxis during surgery. Encourage early ambulation.   Pictures obtained: 10/03/20  Post-op Rx sent to pharmacy: Norco, Zofran, Keflex, IBU, Tylenol  Patient was provided with the Breast Reduction Risks and General Surgical Risk consent document and Pain Medication Agreement  prior to their appointment.  They had adequate time to read through the risk consent documents and Pain Medication Agreement. We also discussed them in person together during this preop appointment. All of their questions were answered to their satisfaction.  Recommended calling if they have any further questions.  Risk consent form  and Pain Medication Agreement to be scanned into patient's chart.  The risk that can be encountered with breast reduction were discussed and include the following but not limited to these:  Breast asymmetry, fluid accumulation, firmness of the breast, inability to breast feed, loss of nipple or areola, skin loss, decrease or no nipple sensation, fat necrosis of the breast tissue, bleeding, infection, healing delay.  There are risks of anesthesia, changes to skin sensation and injury to nerves or blood vessels.  The muscle can be temporarily or permanently injured.  You may have an allergic reaction to tape, suture, glue, blood products which can result in skin discoloration, swelling, pain, skin lesions, poor healing.  Any of these can lead to the need for revisonal surgery or stage procedures.  A reduction has potential to interfere with diagnostic procedures.  Nipple or breast piercing can increase risks of infection.  This procedure is best done when the breast is fully developed.  Changes in the breast will continue to occur over time.  Pregnancy can alter the outcomes of previous breast reduction surgery, weight gain and weigh loss can also effect the long term appearance.   Electronically signed by: Threasa Heads, PA-C 11/07/2020 2:39 PM

## 2020-11-29 ENCOUNTER — Encounter (HOSPITAL_BASED_OUTPATIENT_CLINIC_OR_DEPARTMENT_OTHER): Payer: Self-pay | Admitting: Plastic Surgery

## 2020-11-29 ENCOUNTER — Other Ambulatory Visit: Payer: Self-pay

## 2020-12-04 ENCOUNTER — Other Ambulatory Visit (HOSPITAL_COMMUNITY)
Admission: RE | Admit: 2020-12-04 | Discharge: 2020-12-04 | Disposition: A | Discharge status: Home / Self Care | Payer: Medicare Other | Source: Ambulatory Visit | Attending: Plastic Surgery | Admitting: Plastic Surgery

## 2020-12-04 DIAGNOSIS — Z01812 Encounter for preprocedural laboratory examination: Secondary | ICD-10-CM | POA: Diagnosis not present

## 2020-12-04 DIAGNOSIS — Z20822 Contact with and (suspected) exposure to covid-19: Secondary | ICD-10-CM | POA: Diagnosis not present

## 2020-12-04 LAB — SARS CORONAVIRUS 2 (TAT 6-24 HRS): SARS Coronavirus 2: NEGATIVE

## 2020-12-05 ENCOUNTER — Encounter (HOSPITAL_BASED_OUTPATIENT_CLINIC_OR_DEPARTMENT_OTHER): Payer: Self-pay | Admitting: Plastic Surgery

## 2020-12-05 NOTE — Anesthesia Preprocedure Evaluation (Addendum)
Anesthesia Evaluation  Patient identified by MRN, date of birth, ID band Patient awake    Reviewed: Allergy & Precautions, NPO status , Patient's Chart, lab work & pertinent test results  History of Anesthesia Complications (+) PONV and history of anesthetic complications  Airway Mallampati: II  TM Distance: >3 FB Neck ROM: Full    Dental no notable dental hx. (+) Teeth Intact, Dental Advisory Given, Caps   Pulmonary neg pulmonary ROS,    Pulmonary exam normal breath sounds clear to auscultation       Cardiovascular negative cardio ROS Normal cardiovascular exam Rhythm:Regular Rate:Normal     Neuro/Psych Anxiety negative neurological ROS     GI/Hepatic negative GI ROS, Neg liver ROS,   Endo/Other  Mammary hypertrophy Hyperlipidemia  Renal/GU negative Renal ROS  negative genitourinary   Musculoskeletal negative musculoskeletal ROS (+)   Abdominal   Peds  Hematology negative hematology ROS (+)   Anesthesia Other Findings   Reproductive/Obstetrics                            Anesthesia Physical Anesthesia Plan  ASA: II  Anesthesia Plan: General   Post-op Pain Management:    Induction: Intravenous  PONV Risk Score and Plan: 4 or greater and Treatment may vary due to age or medical condition, Ondansetron, Dexamethasone, Metaclopromide and Diphenhydramine  Airway Management Planned: Oral ETT  Additional Equipment:   Intra-op Plan:   Post-operative Plan: Extubation in OR  Informed Consent: I have reviewed the patients History and Physical, chart, labs and discussed the procedure including the risks, benefits and alternatives for the proposed anesthesia with the patient or authorized representative who has indicated his/her understanding and acceptance.     Dental advisory given  Plan Discussed with: Anesthesiologist and CRNA  Anesthesia Plan Comments:        Anesthesia  Quick Evaluation

## 2020-12-06 ENCOUNTER — Ambulatory Visit (HOSPITAL_BASED_OUTPATIENT_CLINIC_OR_DEPARTMENT_OTHER): Payer: Medicare Other | Admitting: Anesthesiology

## 2020-12-06 ENCOUNTER — Encounter (HOSPITAL_BASED_OUTPATIENT_CLINIC_OR_DEPARTMENT_OTHER)
Admission: RE | Disposition: A | Discharge status: Home / Self Care | Payer: Self-pay | Source: Home / Self Care | Attending: Plastic Surgery

## 2020-12-06 ENCOUNTER — Encounter (HOSPITAL_BASED_OUTPATIENT_CLINIC_OR_DEPARTMENT_OTHER): Payer: Self-pay | Admitting: Plastic Surgery

## 2020-12-06 ENCOUNTER — Ambulatory Visit (HOSPITAL_BASED_OUTPATIENT_CLINIC_OR_DEPARTMENT_OTHER)
Admission: RE | Admit: 2020-12-06 | Discharge: 2020-12-06 | Disposition: A | Discharge status: Home / Self Care | Payer: Medicare Other | Attending: Plastic Surgery | Admitting: Plastic Surgery

## 2020-12-06 ENCOUNTER — Other Ambulatory Visit: Payer: Self-pay

## 2020-12-06 DIAGNOSIS — M549 Dorsalgia, unspecified: Secondary | ICD-10-CM | POA: Diagnosis not present

## 2020-12-06 DIAGNOSIS — F419 Anxiety disorder, unspecified: Secondary | ICD-10-CM | POA: Diagnosis not present

## 2020-12-06 DIAGNOSIS — M545 Low back pain, unspecified: Secondary | ICD-10-CM

## 2020-12-06 DIAGNOSIS — N62 Hypertrophy of breast: Secondary | ICD-10-CM

## 2020-12-06 DIAGNOSIS — E559 Vitamin D deficiency, unspecified: Secondary | ICD-10-CM | POA: Diagnosis not present

## 2020-12-06 DIAGNOSIS — M954 Acquired deformity of chest and rib: Secondary | ICD-10-CM

## 2020-12-06 DIAGNOSIS — E785 Hyperlipidemia, unspecified: Secondary | ICD-10-CM | POA: Diagnosis not present

## 2020-12-06 DIAGNOSIS — M542 Cervicalgia: Secondary | ICD-10-CM

## 2020-12-06 DIAGNOSIS — Z801 Family history of malignant neoplasm of trachea, bronchus and lung: Secondary | ICD-10-CM | POA: Diagnosis not present

## 2020-12-06 HISTORY — DX: Other specified postprocedural states: R11.2

## 2020-12-06 HISTORY — PX: BREAST REDUCTION SURGERY: SHX8

## 2020-12-06 HISTORY — DX: Other specified postprocedural states: Z98.890

## 2020-12-06 HISTORY — DX: Other complications of anesthesia, initial encounter: T88.59XA

## 2020-12-06 SURGERY — MAMMOPLASTY, REDUCTION
Anesthesia: General | Site: Breast | Laterality: Bilateral

## 2020-12-06 MED ORDER — BUPIVACAINE HCL (PF) 0.25 % IJ SOLN
INTRAMUSCULAR | Status: AC
  Filled 2020-12-06: qty 60

## 2020-12-06 MED ORDER — PHENYLEPHRINE 40 MCG/ML (10ML) SYRINGE FOR IV PUSH (FOR BLOOD PRESSURE SUPPORT)
PREFILLED_SYRINGE | INTRAVENOUS | Status: AC
  Filled 2020-12-06: qty 10

## 2020-12-06 MED ORDER — PROMETHAZINE HCL 25 MG/ML IJ SOLN
INTRAMUSCULAR | Status: AC
  Filled 2020-12-06: qty 1

## 2020-12-06 MED ORDER — LACTATED RINGERS IV SOLN: INTRAVENOUS | Status: DC | PRN

## 2020-12-06 MED ORDER — MIDAZOLAM HCL 2 MG/2ML IJ SOLN
INTRAMUSCULAR | Status: AC
  Filled 2020-12-06: qty 2

## 2020-12-06 MED ORDER — CEFAZOLIN SODIUM-DEXTROSE 2-4 GM/100ML-% IV SOLN
INTRAVENOUS | Status: AC
  Filled 2020-12-06: qty 100

## 2020-12-06 MED ORDER — LIDOCAINE 2% (20 MG/ML) 5 ML SYRINGE
INTRAMUSCULAR | Status: AC
  Filled 2020-12-06: qty 5

## 2020-12-06 MED ORDER — LIDOCAINE-EPINEPHRINE 1 %-1:100000 IJ SOLN
INTRAMUSCULAR | Status: DC | PRN
  Administered 2020-12-06: 20 mL

## 2020-12-06 MED ORDER — SUGAMMADEX SODIUM 200 MG/2ML IV SOLN
INTRAVENOUS | Status: DC | PRN
  Administered 2020-12-06: 150 mg via INTRAVENOUS

## 2020-12-06 MED ORDER — PROPOFOL 500 MG/50ML IV EMUL
INTRAVENOUS | Status: DC | PRN
  Administered 2020-12-06: 25 ug/kg/min via INTRAVENOUS

## 2020-12-06 MED ORDER — DEXAMETHASONE SODIUM PHOSPHATE 4 MG/ML IJ SOLN
INTRAMUSCULAR | Status: DC | PRN
  Administered 2020-12-06: 10 mg via INTRAVENOUS

## 2020-12-06 MED ORDER — FENTANYL CITRATE (PF) 100 MCG/2ML IJ SOLN
INTRAMUSCULAR | Status: DC | PRN
  Administered 2020-12-06 (×2): 50 ug via INTRAVENOUS

## 2020-12-06 MED ORDER — PROPOFOL 10 MG/ML IV BOLUS
INTRAVENOUS | Status: DC | PRN
  Administered 2020-12-06: 150 mg via INTRAVENOUS

## 2020-12-06 MED ORDER — DEXAMETHASONE SODIUM PHOSPHATE 10 MG/ML IJ SOLN
INTRAMUSCULAR | Status: AC
  Filled 2020-12-06: qty 1

## 2020-12-06 MED ORDER — OXYCODONE HCL 5 MG PO TABS: 5.0000 mg | ORAL_TABLET | Freq: Once | ORAL | Status: DC | PRN

## 2020-12-06 MED ORDER — ONDANSETRON HCL 4 MG/2ML IJ SOLN
INTRAMUSCULAR | Status: DC | PRN
  Administered 2020-12-06: 4 mg via INTRAVENOUS

## 2020-12-06 MED ORDER — LIDOCAINE HCL (CARDIAC) PF 100 MG/5ML IV SOSY
PREFILLED_SYRINGE | INTRAVENOUS | Status: DC | PRN
  Administered 2020-12-06: 50 mg via INTRAVENOUS

## 2020-12-06 MED ORDER — DIPHENHYDRAMINE HCL 50 MG/ML IJ SOLN
12.5000 mg | Freq: Once | INTRAMUSCULAR | Status: AC
  Administered 2020-12-06: 12.5 mg via INTRAVENOUS

## 2020-12-06 MED ORDER — ROCURONIUM BROMIDE 100 MG/10ML IV SOLN
INTRAVENOUS | Status: DC | PRN
  Administered 2020-12-06: 110 mg via INTRAVENOUS

## 2020-12-06 MED ORDER — SODIUM CHLORIDE 0.9% FLUSH: 3.0000 mL | Freq: Two times a day (BID) | INTRAVENOUS | Status: DC

## 2020-12-06 MED ORDER — HYDROMORPHONE HCL 1 MG/ML IJ SOLN
0.2500 mg | INTRAMUSCULAR | Status: DC | PRN
  Administered 2020-12-06: 0.25 mg via INTRAVENOUS

## 2020-12-06 MED ORDER — CHLORHEXIDINE GLUCONATE CLOTH 2 % EX PADS: 6.0000 | MEDICATED_PAD | Freq: Once | CUTANEOUS | Status: DC

## 2020-12-06 MED ORDER — PROMETHAZINE HCL 25 MG/ML IJ SOLN
INTRAMUSCULAR | Status: DC | PRN
  Administered 2020-12-06: 6.25 mg via INTRAVENOUS

## 2020-12-06 MED ORDER — EPHEDRINE 5 MG/ML INJ
INTRAVENOUS | Status: AC
  Filled 2020-12-06: qty 10

## 2020-12-06 MED ORDER — LIDOCAINE-EPINEPHRINE 1 %-1:100000 IJ SOLN
INTRAMUSCULAR | Status: AC
  Filled 2020-12-06: qty 2

## 2020-12-06 MED ORDER — CEFAZOLIN SODIUM-DEXTROSE 2-4 GM/100ML-% IV SOLN
2.0000 g | INTRAVENOUS | Status: AC
  Administered 2020-12-06: 2 g via INTRAVENOUS

## 2020-12-06 MED ORDER — DIPHENHYDRAMINE HCL 50 MG/ML IJ SOLN
INTRAMUSCULAR | Status: AC
  Filled 2020-12-06: qty 1

## 2020-12-06 MED ORDER — SODIUM CHLORIDE 0.9 % IV SOLN: 250.0000 mL | INTRAVENOUS | Status: DC | PRN

## 2020-12-06 MED ORDER — METOCLOPRAMIDE HCL 5 MG/ML IJ SOLN
INTRAMUSCULAR | Status: AC
  Filled 2020-12-06: qty 2

## 2020-12-06 MED ORDER — ACETAMINOPHEN 325 MG RE SUPP: 650.0000 mg | RECTAL | Status: DC | PRN

## 2020-12-06 MED ORDER — BUPIVACAINE HCL (PF) 0.25 % IJ SOLN
INTRAMUSCULAR | Status: DC | PRN
  Administered 2020-12-06: 30 mL

## 2020-12-06 MED ORDER — EPINEPHRINE PF 1 MG/ML IJ SOLN
INTRAMUSCULAR | Status: AC
  Filled 2020-12-06: qty 2

## 2020-12-06 MED ORDER — HYDROMORPHONE HCL 1 MG/ML IJ SOLN
INTRAMUSCULAR | Status: AC
  Filled 2020-12-06: qty 0.5

## 2020-12-06 MED ORDER — PROPOFOL 500 MG/50ML IV EMUL
INTRAVENOUS | Status: AC
  Filled 2020-12-06: qty 50

## 2020-12-06 MED ORDER — LIDOCAINE HCL (PF) 1 % IJ SOLN
INTRAMUSCULAR | Status: AC
  Filled 2020-12-06: qty 60

## 2020-12-06 MED ORDER — METOCLOPRAMIDE HCL 5 MG/ML IJ SOLN: 10.0000 mg | Freq: Once | INTRAMUSCULAR | Status: DC | PRN

## 2020-12-06 MED ORDER — FENTANYL CITRATE (PF) 100 MCG/2ML IJ SOLN
INTRAMUSCULAR | Status: AC
  Filled 2020-12-06: qty 2

## 2020-12-06 MED ORDER — MIDAZOLAM HCL 5 MG/5ML IJ SOLN
INTRAMUSCULAR | Status: DC | PRN
  Administered 2020-12-06 (×2): 1 mg via INTRAVENOUS

## 2020-12-06 MED ORDER — SUCCINYLCHOLINE CHLORIDE 200 MG/10ML IV SOSY
PREFILLED_SYRINGE | INTRAVENOUS | Status: AC
  Filled 2020-12-06: qty 10

## 2020-12-06 MED ORDER — OXYCODONE HCL 5 MG/5ML PO SOLN: 5.0000 mg | Freq: Once | ORAL | Status: DC | PRN

## 2020-12-06 MED ORDER — ONDANSETRON HCL 4 MG/2ML IJ SOLN
INTRAMUSCULAR | Status: AC
  Filled 2020-12-06: qty 2

## 2020-12-06 MED ORDER — ACETAMINOPHEN 325 MG PO TABS: 650.0000 mg | ORAL_TABLET | ORAL | Status: DC | PRN

## 2020-12-06 MED ORDER — METOCLOPRAMIDE HCL 5 MG/ML IJ SOLN
INTRAMUSCULAR | Status: DC | PRN
  Administered 2020-12-06: 10 mg via INTRAVENOUS

## 2020-12-06 MED ORDER — ROCURONIUM BROMIDE 10 MG/ML (PF) SYRINGE
PREFILLED_SYRINGE | INTRAVENOUS | Status: AC
  Filled 2020-12-06: qty 10

## 2020-12-06 MED ORDER — DIPHENHYDRAMINE HCL 50 MG/ML IJ SOLN
INTRAMUSCULAR | Status: DC | PRN
  Administered 2020-12-06: 12.5 mg via INTRAVENOUS

## 2020-12-06 MED ORDER — SODIUM CHLORIDE 0.9% FLUSH: 3.0000 mL | INTRAVENOUS | Status: DC | PRN

## 2020-12-06 MED ORDER — OXYCODONE HCL 5 MG PO TABS
5.0000 mg | ORAL_TABLET | ORAL | Status: DC | PRN
Start: 2020-12-06 — End: 2020-12-06

## 2020-12-06 SURGICAL SUPPLY — 69 items
ADH SKN CLS APL DERMABOND .7 (GAUZE/BANDAGES/DRESSINGS) ×2
BAG DECANTER FOR FLEXI CONT (MISCELLANEOUS) ×2 IMPLANT
BINDER BREAST LRG (GAUZE/BANDAGES/DRESSINGS) IMPLANT
BINDER BREAST MEDIUM (GAUZE/BANDAGES/DRESSINGS) ×1 IMPLANT
BINDER BREAST XLRG (GAUZE/BANDAGES/DRESSINGS) IMPLANT
BINDER BREAST XXLRG (GAUZE/BANDAGES/DRESSINGS) IMPLANT
BIOPATCH RED 1 DISK 7.0 (GAUZE/BANDAGES/DRESSINGS) IMPLANT
BLADE HEX COATED 2.75 (ELECTRODE) ×2 IMPLANT
BLADE KNIFE PERSONA 10 (BLADE) ×4 IMPLANT
BLADE SURG 15 STRL LF DISP TIS (BLADE) IMPLANT
BLADE SURG 15 STRL SS (BLADE) ×4
BNDG GAUZE ELAST 4 BULKY (GAUZE/BANDAGES/DRESSINGS) IMPLANT
CANISTER SUCT 1200ML W/VALVE (MISCELLANEOUS) ×2 IMPLANT
COVER BACK TABLE 60X90IN (DRAPES) ×2 IMPLANT
COVER MAYO STAND STRL (DRAPES) ×2 IMPLANT
COVER WAND RF STERILE (DRAPES) IMPLANT
DECANTER SPIKE VIAL GLASS SM (MISCELLANEOUS) IMPLANT
DERMABOND ADVANCED (GAUZE/BANDAGES/DRESSINGS) ×2
DERMABOND ADVANCED .7 DNX12 (GAUZE/BANDAGES/DRESSINGS) ×2 IMPLANT
DRAIN CHANNEL 19F RND (DRAIN) IMPLANT
DRAPE LAPAROSCOPIC ABDOMINAL (DRAPES) ×2 IMPLANT
DRSG OPSITE POSTOP 4X6 (GAUZE/BANDAGES/DRESSINGS) ×2 IMPLANT
DRSG PAD ABDOMINAL 8X10 ST (GAUZE/BANDAGES/DRESSINGS) ×4 IMPLANT
ELECT BLADE 4.0 EZ CLEAN MEGAD (MISCELLANEOUS) ×2
ELECT REM PT RETURN 9FT ADLT (ELECTROSURGICAL) ×2
ELECTRODE BLDE 4.0 EZ CLN MEGD (MISCELLANEOUS) IMPLANT
ELECTRODE REM PT RTRN 9FT ADLT (ELECTROSURGICAL) ×1 IMPLANT
EVACUATOR SILICONE 100CC (DRAIN) IMPLANT
GLOVE SURG ENC MOIS LTX SZ6.5 (GLOVE) ×10 IMPLANT
GOWN STRL REUS W/ TWL LRG LVL3 (GOWN DISPOSABLE) ×2 IMPLANT
GOWN STRL REUS W/TWL LRG LVL3 (GOWN DISPOSABLE) ×4
NDL FILTER BLUNT 18X1 1/2 (NEEDLE) IMPLANT
NDL HYPO 25X1 1.5 SAFETY (NEEDLE) ×1 IMPLANT
NDL SAFETY ECLIPSE 18X1.5 (NEEDLE) IMPLANT
NEEDLE FILTER BLUNT 18X 1/2SAF (NEEDLE)
NEEDLE FILTER BLUNT 18X1 1/2 (NEEDLE) IMPLANT
NEEDLE HYPO 18GX1.5 SHARP (NEEDLE)
NEEDLE HYPO 25X1 1.5 SAFETY (NEEDLE) ×2 IMPLANT
NS IRRIG 1000ML POUR BTL (IV SOLUTION) ×2 IMPLANT
PACK BASIN DAY SURGERY FS (CUSTOM PROCEDURE TRAY) ×2 IMPLANT
PAD ALCOHOL SWAB (MISCELLANEOUS) IMPLANT
PAD FOAM SILICONE BACKED (GAUZE/BANDAGES/DRESSINGS) IMPLANT
PENCIL SMOKE EVACUATOR (MISCELLANEOUS) ×2 IMPLANT
PIN SAFETY STERILE (MISCELLANEOUS) IMPLANT
SLEEVE SCD COMPRESS KNEE MED (MISCELLANEOUS) ×2 IMPLANT
SPONGE LAP 18X18 RF (DISPOSABLE) ×5 IMPLANT
STRIP SUTURE WOUND CLOSURE 1/2 (MISCELLANEOUS) ×4 IMPLANT
SUT MNCRL AB 4-0 PS2 18 (SUTURE) ×10 IMPLANT
SUT MON AB 3-0 SH 27 (SUTURE) ×2
SUT MON AB 3-0 SH27 (SUTURE) ×1 IMPLANT
SUT MON AB 5-0 PS2 18 (SUTURE) ×5 IMPLANT
SUT PDS 3-0 CT2 (SUTURE) ×6
SUT PDS AB 2-0 CT2 27 (SUTURE) IMPLANT
SUT PDS II 3-0 CT2 27 ABS (SUTURE) IMPLANT
SUT SILK 3 0 PS 1 (SUTURE) IMPLANT
SUT VIC AB 3-0 SH 27 (SUTURE) ×8
SUT VIC AB 3-0 SH 27X BRD (SUTURE) ×4 IMPLANT
SUT VICRYL 4-0 PS2 18IN ABS (SUTURE) ×6 IMPLANT
SYR 50ML LL SCALE MARK (SYRINGE) IMPLANT
SYR BULB IRRIG 60ML STRL (SYRINGE) ×2 IMPLANT
SYR CONTROL 10ML LL (SYRINGE) ×2 IMPLANT
TAPE MEASURE VINYL STERILE (MISCELLANEOUS) IMPLANT
TOWEL GREEN STERILE FF (TOWEL DISPOSABLE) ×4 IMPLANT
TRAY DSU PREP LF (CUSTOM PROCEDURE TRAY) ×2 IMPLANT
TUBE CONNECTING 20X1/4 (TUBING) ×2 IMPLANT
TUBING INFILTRATION IT-10001 (TUBING) IMPLANT
TUBING SET GRADUATE ASPIR 12FT (MISCELLANEOUS) IMPLANT
UNDERPAD 30X36 HEAVY ABSORB (UNDERPADS AND DIAPERS) ×4 IMPLANT
YANKAUER SUCT BULB TIP NO VENT (SUCTIONS) ×2 IMPLANT

## 2020-12-06 NOTE — Anesthesia Procedure Notes (Signed)
Procedure Name: Intubation Date/Time: 12/06/2020 8:45 AM Performed by: Willa Frater, CRNA Pre-anesthesia Checklist: Patient identified, Emergency Drugs available, Suction available and Patient being monitored Patient Re-evaluated:Patient Re-evaluated prior to induction Oxygen Delivery Method: Circle system utilized Preoxygenation: Pre-oxygenation with 100% oxygen Induction Type: IV induction Ventilation: Mask ventilation without difficulty Laryngoscope Size: Mac and 3 Grade View: Grade I Tube type: Oral Tube size: 7.0 mm Number of attempts: 1 Airway Equipment and Method: Stylet and Oral airway Placement Confirmation: ETT inserted through vocal cords under direct vision,  positive ETCO2 and breath sounds checked- equal and bilateral Secured at: 21 cm Tube secured with: Tape Dental Injury: Teeth and Oropharynx as per pre-operative assessment

## 2020-12-06 NOTE — Discharge Instructions (Signed)
Post Anesthesia Home Care Instructions  Activity: Get plenty of rest for the remainder of the day. A responsible individual must stay with you for 24 hours following the procedure.  For the next 24 hours, DO NOT: -Drive a car -Operate machinery -Drink alcoholic beverages -Take any medication unless instructed by your physician -Make any legal decisions or sign important papers.  Meals: Start with liquid foods such as gelatin or soup. Progress to regular foods as tolerated. Avoid greasy, spicy, heavy foods. If nausea and/or vomiting occur, drink only clear liquids until the nausea and/or vomiting subsides. Call your physician if vomiting continues.  Special Instructions/Symptoms: Your throat may feel dry or sore from the anesthesia or the breathing tube placed in your throat during surgery. If this causes discomfort, gargle with warm salt water. The discomfort should disappear within 24 hours.  If you had a scopolamine patch placed behind your ear for the management of post- operative nausea and/or vomiting:  1. The medication in the patch is effective for 72 hours, after which it should be removed.  Wrap patch in a tissue and discard in the trash. Wash hands thoroughly with soap and water. 2. You may remove the patch earlier than 72 hours if you experience unpleasant side effects which may include dry mouth, dizziness or visual disturbances. 3. Avoid touching the patch. Wash your hands with soap and water after contact with the patch.    INSTRUCTIONS FOR AFTER SURGERY   You will likely have some questions about what to expect following your operation.  The following information will help you and your family understand what to expect when you are discharged from the hospital.  Following these guidelines will help ensure a smooth recovery and reduce risks of complications.  Postoperative instructions include information on: diet, wound care, medications and physical activity.  AFTER  SURGERY Expect to go home after the procedure.  In some cases, you may need to spend one night in the hospital for observation.  DIET This surgery does not require a specific diet.  However, I have to mention that the healthier you eat the better your body can start healing. It is important to increasing your protein intake.  This means limiting the foods with added sugar.  Focus on fruits and vegetables and some meat. It is very important to drink water after your surgery.  If your urine is bright yellow, then it is concentrated, and you need to drink more water.  As a general rule after surgery, you should have 8 ounces of water every hour while awake.  If you find you are persistently nauseated or unable to take in liquids let us know.  NO TOBACCO USE or EXPOSURE.  This will slow your healing process and increase the risk of a wound.  WOUND CARE If you don't have a drain: You can shower the day after surgery.  Use fragrance free soap.  Dial, Dove, Ivory and Cetaphil are usually mild on the skin.  If you have steri-strips / tape directly attached to your skin leave them in place. It is OK to get these wet.  No baths, pools or hot tubs for two weeks. We close your incision to leave the smallest and best-looking scar. No ointment or creams on your incisions until given the go ahead.  Especially not Neosporin (Too many skin reactions with this one).  A few weeks after surgery you can use Mederma and start massaging the scar. We ask you to wear your binder or   sports bra for the first 6 weeks around the clock, including while sleeping. This provides added comfort and helps reduce the fluid accumulation at the surgery site.  ACTIVITY No heavy lifting until cleared by the doctor.  It is OK to walk and climb stairs. In fact, moving your legs is very important to decrease your risk of a blood clot.  It will also help keep you from getting deconditioned.  Every 1 to 2 hours get up and walk for 5 minutes. This  will help with a quicker recovery back to normal.  Let pain be your guide so you don't do too much.  NO, you cannot do the spring cleaning and don't plan on taking care of anyone else.  This is your time for TLC.   WORK Everyone returns to work at different times. As a rough guide, most people take at least 1 - 2 weeks off prior to returning to work. If you need documentation for your job, bring the forms to your postoperative follow up visit.  DRIVING Arrange for someone to bring you home from the hospital.  You may be able to drive a few days after surgery but not while taking any narcotics or valium.  BOWEL MOVEMENTS Constipation can occur after anesthesia and while taking pain medication.  It is important to stay ahead for your comfort.  We recommend taking Milk of Magnesia (2 tablespoons; twice a day) while taking the pain pills.  SEROMA This is fluid your body tried to put in the surgical site.  This is normal but if it creates excessive pain and swelling let us know.  It usually decreases in a few weeks.  MEDICATIONS and PAIN CONTROL At your preoperative visit for you history and physical you were given the following medications: 1. An antibiotic: Start this medication when you get home and take according to the instructions on the bottle. 2. Zofran 4 mg:  This is to treat nausea and vomiting.  You can take this every 6 hours as needed and only if needed. 3. Norco (hydrocodone/acetaminophen) 5/325 mg:  This is only to be used after you have taken the motrin or the tylenol. Every 8 hours as needed. Over the counter Medication to take: 4. Ibuprofen (Motrin) 600 mg:  Take this every 6 hours.  If you have additional pain then take 500 mg of the tylenol.  Only take the Norco after you have tried these two. 5. Miralax or stool softener of choice: Take this according to the bottle if you take the Norco.  WHEN TO CALL Call your surgeon's office if any of the following occur: . Fever 101  degrees F or greater . Excessive bleeding or fluid from the incision site. . Pain that increases over time without aid from the medications . Redness, warmth, or pus draining from incision sites . Persistent nausea or inability to take in liquids . Severe misshapen area that underwent the operation.   

## 2020-12-06 NOTE — Op Note (Signed)
Breast Reduction Op note:    DATE OF PROCEDURE: 12/06/2020  LOCATION: [place]  SURGEON: Lyndee Leo Sanger Allesandra Huebsch, DO  ASSISTANT: Phoebe Sharps, PA  PREOPERATIVE DIAGNOSIS 1. Macromastia 2. Neck Pain 3. Back Pain  POSTOPERATIVE DIAGNOSIS 1. Macromastia 2. Neck Pain 3. Back Pain  PROCEDURES 1. Bilateral breast reduction.  Right reduction 387g, Left reduction 481E  COMPLICATIONS: None.  DRAINS: none  INDICATIONS FOR PROCEDURE Tracy Pena is a 70 y.o. year-old female born on 08/27/1951,with a history of symptomatic macromastia with concominant back pain, neck pain, shoulder grooving from her bra.   MRN: [id number]  CONSENT Informed consent was obtained directly from the patient. The risks, benefits and alternatives were fully discussed. Specific risks including but not limited to bleeding, infection, hematoma, seroma, scarring, pain, nipple necrosis, asymmetry, poor cosmetic results, and need for further surgery were discussed. The patient had ample opportunity to have her questions answered to her satisfaction.  DESCRIPTION OF PROCEDURE  Patient was brought into the operating room and placed in a supine position.  SCDs were placed and appropriate padding was performed.  Antibiotics were given. The patient underwent general anesthesia and the chest was prepped and draped in a sterile fashion.  A timeout was performed and all information was confirmed to be correct.  Right side: Preoperative markings were confirmed.  Incision lines were injected with 1% Xylocaine with epinephrine.  After waiting for vasoconstriction, the marked lines were incised.  A Wise-pattern superomedial breast reduction was performed by de-epithelializing the pedicle, using bovie to create the superomedial pedicle, and removing breast tissue from the lateral and inferior portions of the breast.  Care was taken to not undermine the breast pedicle. Hemostasis was achieved.  The  nipple was gently rotated into position and the soft tissue closed with 4-0 Monocryl.   The pocket was irrigated and hemostasis confirmed.  The deep tissues were approximated with 3-0 PDS and Monocryl sutures and the skin was closed with deep dermal and subcuticular 4-0 Monocryl sutures.  The nipple and skin flaps had good capillary refill at the end of the procedure.    Left side: Preoperative markings were confirmed.  Incision lines were injected with 1% Xylocaine with epinephrine.  After waiting for vasoconstriction, the marked lines were incised.  A Wise-pattern superomedial breast reduction was performed by de-epithelializing the pedicle, using bovie to create the superomedial pedicle, and removing breast tissue from the lateral and inferior portions of the breast.  Care was taken to not undermine the breast pedicle. Hemostasis was achieved.  The nipple was gently rotated into position and the soft tissue was closed with 4-0 Monocryl.  The patient was sat upright and size and shape symmetry was confirmed.  The pocket was irrigated and hemostasis confirmed.  The deep tissues were approximated with 3-0 PDS and Monocryl sutures and the skin was closed with deep dermal and subcuticular 4-0 Monocryl sutures.  Dermabond was applied.  A breast binder and ABDs were placed.  The nipple and skin flaps had good capillary refill at the end of the procedure.  The patient tolerated the procedure well. The patient was allowed to wake from anesthesia and taken to the recovery room in satisfactory condition.  The advanced practice practitioner (APP) assisted throughout the case.  The APP was essential in retraction and counter traction when needed to make the case progress smoothly.  This retraction and assistance made it possible to see the tissue plans for the procedure.  The assistance was needed for  blood control, tissue re-approximation and assisted with closure of the incision site.

## 2020-12-06 NOTE — Addendum Note (Signed)
Addendum  created 12/06/20 1305 by Willa Frater, CRNA   Charge Capture section accepted

## 2020-12-06 NOTE — Interval H&P Note (Signed)
History and Physical Interval Note:  12/06/2020 8:25 AM  Tracy Pena  has presented today for surgery, with the diagnosis of mammary hypertrophy.  The various methods of treatment have been discussed with the patient and family. After consideration of risks, benefits and other options for treatment, the patient has consented to  Procedure(s) with comments: BREAST REDUCTION WITH LIPOSUCTION (Bilateral) - 2.5 hours, please as a surgical intervention.  The patient's history has been reviewed, patient examined, no change in status, stable for surgery.  I have reviewed the patient's chart and labs.  Questions were answered to the patient's satisfaction.     Loel Lofty Dayrin Stallone

## 2020-12-06 NOTE — Transfer of Care (Signed)
Immediate Anesthesia Transfer of Care Note  Patient: Tracy Pena  Procedure(s) Performed: MAMMARY REDUCTION  (BREAST) (Bilateral Breast)  Patient Location: PACU  Anesthesia Type:General  Level of Consciousness: sedated  Airway & Oxygen Therapy: Patient Spontanous Breathing and Patient connected to face mask oxygen  Post-op Assessment: Report given to RN and Post -op Vital signs reviewed and stable  Post vital signs: Reviewed and stable  Last Vitals:  Vitals Value Taken Time  BP    Temp    Pulse    Resp    SpO2      Last Pain:  Vitals:   12/06/20 0732  TempSrc: Oral  PainSc: 0-No pain         Complications: No complications documented.

## 2020-12-06 NOTE — Anesthesia Postprocedure Evaluation (Signed)
Anesthesia Post Note  Patient: ARMONI KLUDT  Procedure(s) Performed: MAMMARY REDUCTION  (BREAST) (Bilateral Breast)     Patient location during evaluation: PACU Anesthesia Type: General Level of consciousness: awake and alert and oriented Pain management: pain level controlled Vital Signs Assessment: post-procedure vital signs reviewed and stable Respiratory status: spontaneous breathing, nonlabored ventilation and respiratory function stable Cardiovascular status: blood pressure returned to baseline and stable Postop Assessment: no apparent nausea or vomiting Anesthetic complications: no   No complications documented.  Last Vitals:  Vitals:   12/06/20 1200 12/06/20 1245  BP: (!) 135/55 (!) 135/52  Pulse: 86 87  Resp: 14 16  Temp:  36.7 C  SpO2: 96% 95%    Last Pain:  Vitals:   12/06/20 1245  TempSrc:   PainSc: 4                  Brianca Fortenberry A.

## 2020-12-07 ENCOUNTER — Encounter: Payer: Self-pay | Admitting: Plastic Surgery

## 2020-12-07 ENCOUNTER — Encounter (HOSPITAL_BASED_OUTPATIENT_CLINIC_OR_DEPARTMENT_OTHER): Payer: Self-pay | Admitting: Plastic Surgery

## 2020-12-07 LAB — SURGICAL PATHOLOGY

## 2020-12-09 ENCOUNTER — Encounter: Payer: Self-pay | Admitting: Internal Medicine

## 2020-12-13 ENCOUNTER — Other Ambulatory Visit: Payer: Self-pay

## 2020-12-13 ENCOUNTER — Encounter: Payer: Self-pay | Admitting: Internal Medicine

## 2020-12-13 ENCOUNTER — Telehealth (INDEPENDENT_AMBULATORY_CARE_PROVIDER_SITE_OTHER): Payer: Medicare Other | Admitting: Internal Medicine

## 2020-12-13 VITALS — Wt 140.0 lb

## 2020-12-13 DIAGNOSIS — G4700 Insomnia, unspecified: Secondary | ICD-10-CM | POA: Diagnosis not present

## 2020-12-13 NOTE — Progress Notes (Signed)
Virtual Visit via Video Note  I connected with Tracy Pena on 12/13/20 at  3:30 PM EST by a video enabled telemedicine application and verified that I am speaking with the correct person using two identifiers.  Location patient: home Location provider: work office Persons participating in the virtual visit: patient, provider  I discussed the limitations of evaluation and management by telemedicine and the availability of in person appointments. The patient expressed understanding and agreed to proceed.   HPI: She had breast reduction surgery 8 days ago.  She is recovering well.  She is having a hard time sleeping.  She has been on trazodone 50 mg at bedtime for many years.  This difficulty sleeping is mainly positional.  She is a lifelong side sleeper and since the surgery has been told to sleep on her back only.  She feels like she is having a hard time recovering due to sleep deprivation.   ROS: Constitutional: Denies fever, chills, diaphoresis, appetite change and fatigue.  HEENT: Denies photophobia, eye pain, redness, hearing loss, ear pain, congestion, sore throat, rhinorrhea, sneezing, mouth sores, trouble swallowing, neck pain, neck stiffness and tinnitus.   Respiratory: Denies SOB, DOE, cough, chest tightness,  and wheezing.   Cardiovascular: Denies chest pain, palpitations and leg swelling.  Gastrointestinal: Denies nausea, vomiting, abdominal pain, diarrhea, constipation, blood in stool and abdominal distention.  Genitourinary: Denies dysuria, urgency, frequency, hematuria, flank pain and difficulty urinating.  Endocrine: Denies: hot or cold intolerance, sweats, changes in hair or nails, polyuria, polydipsia. Musculoskeletal: Denies myalgias, back pain, joint swelling, arthralgias and gait problem.  Skin: Denies pallor, rash and wound.  Neurological: Denies dizziness, seizures, syncope, weakness, light-headedness, numbness and headaches.  Hematological: Denies  adenopathy. Easy bruising, personal or family bleeding history  Psychiatric/Behavioral: Denies suicidal ideation, mood changes, confusion, nervousness and agitation   Past Medical History:  Diagnosis Date  . Complication of anesthesia    nausea after anesthesia  . Decreased hearing   . H/O cholesteatoma    surgery [facility]  . Hx of varicella    as a child  . PONV (postoperative nausea and vomiting)     Past Surgical History:  Procedure Laterality Date  . BREAST REDUCTION SURGERY Bilateral 12/06/2020   Procedure: MAMMARY REDUCTION  (BREAST);  Surgeon: Wallace Going, DO;  Location: [facility];  Service: Plastics;  Laterality: Bilateral;  2.5 hours, please  . INNER EAR SURGERY     at [facility] for Cholesteatoma artificial ear drum  . REPEAT CESAREAN SECTION     had 3    Family History  Problem Relation Age of Onset  . Lung cancer Mother   . Thyroid disease Daughter        graves  . Celiac disease Grandchild   . Diabetes type I Neg Hx     SOCIAL HX:   reports that she has never smoked. She has never used smokeless tobacco. She reports current alcohol use of about 3.0 standard drinks of alcohol per week. She reports that she does not use drugs.   Current Outpatient Medications:  .  acetaminophen (TYLENOL) 500 MG tablet, Take 1 tablet (500 mg total) by mouth every 6 (six) hours as needed. For use AFTER surgery, Disp: 30 tablet, Rfl: 0 .  Cholecalciferol (VITAMIN D3 PO), Take by mouth daily., Disp: , Rfl:  .  ibuprofen (ADVIL) 600 MG tablet, Take 1 tablet (600 mg total) by mouth every 6 (six) hours as needed for mild pain or  moderate pain. For use AFTER surgery, Disp: 30 tablet, Rfl: 0 .  Ibuprofen 200 MG CAPS, Take 1 capsule by mouth as needed., Disp: , Rfl:  .  Multiple Vitamin (MULTIVITAMIN PO), Take by mouth., Disp: , Rfl:  .  ondansetron (ZOFRAN-ODT) 4 MG disintegrating tablet, Take 1 tablet (4 mg total) by mouth every 8 (eight) hours as needed for nausea or  vomiting., Disp: 20 tablet, Rfl: 0 .  Probiotic Product (PROBIOTIC PO), Take by mouth., Disp: , Rfl:  .  traZODone (DESYREL) 50 MG tablet, TAKE HALF TO ONE TABLET BY MOUTH AT BEDTIME AS NEEDED FOR SLEEP, Disp: 90 tablet, Rfl: 0  EXAM:   VITALS per patient if applicable: None reported  GENERAL: alert, oriented, appears well and in no acute distress  HEENT: atraumatic, conjunttiva clear, no obvious abnormalities on inspection of external nose and ears, wears corrective lenses  NECK: normal movements of the head and neck  LUNGS: on inspection no signs of respiratory distress, breathing rate appears normal, no obvious gross increased work of breathing, gasping or wheezing  CV: no obvious cyanosis  MS: moves all visible extremities without noticeable abnormality  PSYCH/NEURO: pleasant and cooperative, no obvious depression or anxiety, speech and thought processing grossly intact  ASSESSMENT AND PLAN:   Insomnia, unspecified type -We have discussed sleep hygiene techniques. -She will transiently increase her trazodone 50 mg from 1 to 2 tablets at bedtime with hope to wean down over the next few weeks as she starts healing from surgery. -She knows to contact me if further issues arise.    I discussed the assessment and treatment plan with the patient. The patient was provided an opportunity to ask questions and all were answered. The patient agreed with the plan and demonstrated an understanding of the instructions.   The patient was advised to call back or seek an in-person evaluation if the symptoms worsen or if the condition fails to improve as anticipated.    Lelon Frohlich, MD  [facility]

## 2020-12-15 ENCOUNTER — Other Ambulatory Visit: Payer: Self-pay

## 2020-12-15 ENCOUNTER — Ambulatory Visit (INDEPENDENT_AMBULATORY_CARE_PROVIDER_SITE_OTHER): Payer: Medicare Other | Admitting: Plastic Surgery

## 2020-12-15 ENCOUNTER — Encounter: Payer: Self-pay | Admitting: Plastic Surgery

## 2020-12-15 VITALS — BP 129/72 | HR 73

## 2020-12-15 DIAGNOSIS — N62 Hypertrophy of breast: Secondary | ICD-10-CM

## 2020-12-15 NOTE — Progress Notes (Signed)
   Subjective:    Patient ID: Tammy Sours, female    DOB: December 19, 1950, 70 y.o.   MRN: [id number]  The patient is a 70 year old female here for follow-up on her bilateral breast reduction.  She had approximately 400 g removed from each breast.  There is no sign of seroma or hematoma.  The dressing is in place.  There is bruising and swelling as expected.  No sign of cellulitis or infection.     Review of Systems  Constitutional: Negative.   HENT: Negative.   Eyes: Negative.   Respiratory: Negative.   Cardiovascular: Negative.   Genitourinary: Negative.        Objective:   Physical Exam Vitals and nursing note reviewed.  Constitutional:      Appearance: Normal appearance.  Cardiovascular:     Rate and Rhythm: Normal rate.     Pulses: Normal pulses.  Neurological:     General: No focal deficit present.     Mental Status: She is alert. Mental status is at baseline.  Psychiatric:        Mood and Affect: Mood normal.        Behavior: Behavior normal.           Assessment & Plan:     ICD-10-CM   1. Symptomatic mammary hypertrophy  N62     Continue with sports bra as able.

## 2020-12-20 DIAGNOSIS — Z9889 Other specified postprocedural states: Secondary | ICD-10-CM | POA: Insufficient documentation

## 2020-12-20 NOTE — Progress Notes (Addendum)
Patient is a 70 year old female here for follow-up after undergoing bilateral breast reduction with liposuction on 12/06/2020 with Dr. Marla Roe.  ~ 2 weeks PO Patient reports she is doing well overall.  Feels she is a little bit tired.  Reminded her she is only 2 weeks out of surgery and her body is doing a lot of work to heal.  Denies fever/chills, nausea/vomiting, breast pain.  Removed honeycomb dressings bilaterally today.  Steri-Strips in place.  Bilateral incisions are healing nicely, C/D/I.  No signs of infection, redness, drainage.  No signs of seroma/hematoma.  Recommend she continue wearing compression garment 24/7 until 6 weeks postop.  Continue to avoid heavy lifting and vigorous activity.  She may begin to shower normally, but no baths yet.  Follow-up in 2 to 3 weeks.  Return precautions provided.  Call office with questions/concerns.  No photo taken today because haiku was not working.

## 2020-12-22 ENCOUNTER — Encounter: Payer: Self-pay | Admitting: Plastic Surgery

## 2020-12-22 ENCOUNTER — Other Ambulatory Visit: Payer: Self-pay

## 2020-12-22 ENCOUNTER — Ambulatory Visit (INDEPENDENT_AMBULATORY_CARE_PROVIDER_SITE_OTHER): Payer: Medicare Other | Admitting: Plastic Surgery

## 2020-12-22 VITALS — BP 160/66 | HR 78

## 2020-12-22 DIAGNOSIS — Z9889 Other specified postprocedural states: Secondary | ICD-10-CM

## 2021-01-12 ENCOUNTER — Other Ambulatory Visit: Payer: Self-pay

## 2021-01-12 ENCOUNTER — Encounter: Payer: Self-pay | Admitting: Surgical

## 2021-01-12 ENCOUNTER — Ambulatory Visit (INDEPENDENT_AMBULATORY_CARE_PROVIDER_SITE_OTHER): Payer: Medicare Other | Admitting: Surgical

## 2021-01-12 VITALS — BP 134/71

## 2021-01-12 DIAGNOSIS — Z9889 Other specified postprocedural states: Secondary | ICD-10-CM

## 2021-01-12 NOTE — Progress Notes (Signed)
Patient is a 70 year old female here for follow-up after bilateral breast reduction with Dr. Marla Roe on 12/06/2020.  She is approximately 5 weeks postop.  Patient reports she is doing really well, she reports she still has some Dermabond and marking pen on her skin.  She is not having any infectious symptoms.  She reports that she is very happy, her neck and back pain have improved.  Chaperone present on exam On exam bilateral NAC's are viable, bilateral breast incisions are intact.  She does have some residual Dermabond noted.  There is no erythema.  Bilateral breasts are soft, no fluid wave or subcutaneous fluid noted with palpation.  Recommend continue to avoid strenuous activity and wearing compression garment 24/7 for 1 more week. In 1 week she can begin increasing her activity as tolerated. Recommend continue her compressive garment throughout the day, but no longer necessary to wear at night. Recommend following up on an as-needed basis.  Recommend calling with questions or concerns. She can continue using a scar cream.  Pictures were obtained of the patient and placed in the chart with the patient's or guardian's permission.

## 2021-01-18 ENCOUNTER — Other Ambulatory Visit: Payer: Self-pay | Admitting: Internal Medicine

## 2021-01-18 DIAGNOSIS — G4700 Insomnia, unspecified: Secondary | ICD-10-CM

## 2021-02-19 ENCOUNTER — Encounter: Payer: Self-pay | Admitting: Gastroenterology

## 2021-04-12 ENCOUNTER — Encounter: Payer: Self-pay | Admitting: Internal Medicine

## 2021-04-13 ENCOUNTER — Other Ambulatory Visit (HOSPITAL_COMMUNITY): Payer: Self-pay

## 2021-04-13 ENCOUNTER — Telehealth: Payer: Medicare Other | Admitting: Emergency Medicine

## 2021-04-13 ENCOUNTER — Telehealth: Payer: Medicare Other | Admitting: Internal Medicine

## 2021-04-13 DIAGNOSIS — U071 COVID-19: Secondary | ICD-10-CM

## 2021-04-13 MED ORDER — MOLNUPIRAVIR EUA 200MG CAPSULE
4.0000 | ORAL_CAPSULE | Freq: Two times a day (BID) | ORAL | 0 refills | Status: AC
  Filled 2021-04-13: qty 40, 5d supply, fill #0

## 2021-04-13 MED ORDER — BENZONATATE 100 MG PO CAPS
100.0000 mg | ORAL_CAPSULE | Freq: Two times a day (BID) | ORAL | 0 refills | Status: DC | PRN
  Filled 2021-04-13: qty 20, 10d supply, fill #0

## 2021-04-13 NOTE — Progress Notes (Signed)
Virtual Visit Consent   Tracy Pena, you are scheduled for a virtual visit with a [facility] provider today.     Just as with appointments in the office, your consent must be obtained to participate.  Your consent will be active for this visit and any virtual visit you may have with one of our providers in the next 365 days.     If you have a MyChart account, a copy of this consent can be sent to you electronically.  All virtual visits are billed to your insurance company just like a traditional visit in the office.    As this is a virtual visit, video technology does not allow for your provider to perform a traditional examination.  This may limit your provider's ability to fully assess your condition.  If your provider identifies any concerns that need to be evaluated in person or the need to arrange testing (such as labs, EKG, etc.), we will make arrangements to do so.     Although advances in technology are sophisticated, we cannot ensure that it will always work on either your end or our end.  If the connection with a video visit is poor, the visit may have to be switched to a telephone visit.  With either a video or telephone visit, we are not always able to ensure that we have a secure connection.     I need to obtain your verbal consent now.   Are you willing to proceed with your visit today?    Tracy Pena has provided verbal consent on 04/13/2021 for a virtual visit telephone.   Montine Circle, PA-C   Date: 04/13/2021 11:54 AM   Virtual Visit via Video Note   IMontine Circle, connected [email or web address]@ ([phone], [id number]) on 04/13/21 at 11:45 AM EDT by a video-enabled telemedicine application and verified that I am speaking with the correct person using two identifiers.  Location: Patient: Virtual Visit Location Patient: Home Provider: Virtual Visit Location Provider: Home Office   I discussed the limitations of evaluation and management by telemedicine and  the availability of in person appointments. The patient expressed understanding and agreed to proceed.    History of Present Illness: Tracy Pena is a 69 y.o. who identifies as a female who was assigned female at birth, and is being seen today for COVID-19.  Has been sick for 2 days.  Tested positive on Wednesday. Denies SOB.  Reports fever to 102.3.  Reports associated headache.  Denies v/d.  Has had slight nausea.  HPI: HPI  Problems:  Patient Active Problem List   Diagnosis Date Noted   S/P bilateral breast reduction 12/20/2020   Back pain 06/15/2019   Neck pain 06/15/2019   Symptomatic mammary hypertrophy 06/15/2019   Vitamin D deficiency 05/25/2019   Osteopenia 06/02/2016   Hyperlipidemia 11/14/2014   Anxiety state, unspecified 09/20/2013   Medication management 04/09/2013   Elevated blood pressure reading 09/18/2012   H/O cholesteatoma    Visit for preventive health examination 09/22/2011   HYPERLIPIDEMIA 05/01/2010   VAGINITIS, ATROPHIC, POSTMENOPAUSAL 02/28/2009   URINALYSIS, ABNORMAL 02/28/2009   ADVERSE REACTION TO MEDICATION 02/28/2009   DECREASED HEARING 07/01/2008   HAND PAIN, BILATERAL 07/01/2008   Insomnia 07/01/2008    Allergies: No Known Allergies Medications:  Current Outpatient Medications:    acetaminophen (TYLENOL) 500 MG tablet, Take 1 tablet (500 mg total) by mouth every 6 (six) hours as needed. For use AFTER surgery, Disp: 30 tablet, Rfl: 0   Cholecalciferol (  VITAMIN D3 PO), Take by mouth daily., Disp: , Rfl:    ibuprofen (ADVIL) 600 MG tablet, Take 1 tablet (600 mg total) by mouth every 6 (six) hours as needed for mild pain or moderate pain. For use AFTER surgery, Disp: 30 tablet, Rfl: 0   Multiple Vitamin (MULTIVITAMIN PO), Take by mouth., Disp: , Rfl:    Probiotic Product (PROBIOTIC PO), Take by mouth., Disp: , Rfl:    traZODone (DESYREL) 50 MG tablet, TAKE HALF TO ONE TABLET BY MOUTH AT BEDTIME AS NEEDED FOR SLEEP, Disp: 90 tablet, Rfl:  0  Observations/Objective: Patient is well-developed, well-nourished in no acute distress.  Resting comfortably at home prior to being disconnected from.  Head is normocephalic, atraumatic.  No labored breathing.  Speech is clear and coherent with logical content.  Patient is alert and oriented at baseline.    Assessment and Plan: 1. COVID-19 -Molnupiravir -Tessalon -PCP f/u  Follow Up Instructions: I discussed the assessment and treatment plan with the patient. The patient was provided an opportunity to ask questions and all were answered. The patient agreed with the plan and demonstrated an understanding of the instructions.  A copy of instructions were sent to the patient via MyChart.  The patient was advised to call back or seek an in-person evaluation if the symptoms worsen or if the condition fails to improve as anticipated.  Time:  I spent 17 minutes with the patient via telehealth technology discussing the above problems/concerns.    Montine Circle, PA-C

## 2021-04-13 NOTE — Patient Instructions (Signed)
Take medications as directed.  Follow-up with your doctor in 1 week.  Go to the ER if you worsen.

## 2021-04-23 ENCOUNTER — Other Ambulatory Visit: Payer: Self-pay | Admitting: Internal Medicine

## 2021-04-23 DIAGNOSIS — G4700 Insomnia, unspecified: Secondary | ICD-10-CM

## 2021-07-23 ENCOUNTER — Other Ambulatory Visit: Payer: Self-pay | Admitting: Internal Medicine

## 2021-07-23 DIAGNOSIS — G4700 Insomnia, unspecified: Secondary | ICD-10-CM

## 2021-09-10 DIAGNOSIS — Z23 Encounter for immunization: Secondary | ICD-10-CM | POA: Diagnosis not present

## 2021-10-04 ENCOUNTER — Other Ambulatory Visit (HOSPITAL_BASED_OUTPATIENT_CLINIC_OR_DEPARTMENT_OTHER): Payer: Self-pay

## 2021-10-12 ENCOUNTER — Encounter: Payer: Medicare Other | Admitting: Internal Medicine

## 2021-10-26 ENCOUNTER — Other Ambulatory Visit: Payer: Self-pay | Admitting: Internal Medicine

## 2021-10-26 DIAGNOSIS — G4700 Insomnia, unspecified: Secondary | ICD-10-CM

## 2021-11-09 ENCOUNTER — Other Ambulatory Visit (HOSPITAL_BASED_OUTPATIENT_CLINIC_OR_DEPARTMENT_OTHER): Payer: Self-pay

## 2021-11-20 ENCOUNTER — Telehealth: Payer: Self-pay

## 2021-11-20 ENCOUNTER — Encounter: Payer: Self-pay | Admitting: Internal Medicine

## 2021-11-20 ENCOUNTER — Ambulatory Visit (INDEPENDENT_AMBULATORY_CARE_PROVIDER_SITE_OTHER): Payer: Medicare Other | Admitting: Internal Medicine

## 2021-11-20 VITALS — BP 135/78 | HR 76 | Temp 97.9°F | Ht 62.21 in | Wt 138.0 lb

## 2021-11-20 DIAGNOSIS — E559 Vitamin D deficiency, unspecified: Secondary | ICD-10-CM

## 2021-11-20 DIAGNOSIS — E782 Mixed hyperlipidemia: Secondary | ICD-10-CM

## 2021-11-20 DIAGNOSIS — G4700 Insomnia, unspecified: Secondary | ICD-10-CM

## 2021-11-20 DIAGNOSIS — Z780 Asymptomatic menopausal state: Secondary | ICD-10-CM

## 2021-11-20 DIAGNOSIS — Z1231 Encounter for screening mammogram for malignant neoplasm of breast: Secondary | ICD-10-CM | POA: Diagnosis not present

## 2021-11-20 DIAGNOSIS — Z0000 Encounter for general adult medical examination without abnormal findings: Secondary | ICD-10-CM | POA: Diagnosis not present

## 2021-11-20 DIAGNOSIS — M8580 Other specified disorders of bone density and structure, unspecified site: Secondary | ICD-10-CM | POA: Diagnosis not present

## 2021-11-20 DIAGNOSIS — Z13820 Encounter for screening for osteoporosis: Secondary | ICD-10-CM | POA: Diagnosis not present

## 2021-11-20 DIAGNOSIS — Z9889 Other specified postprocedural states: Secondary | ICD-10-CM | POA: Diagnosis not present

## 2021-11-20 LAB — CBC WITH DIFFERENTIAL/PLATELET
Basophils Absolute: 0 10*3/uL (ref 0.0–0.1)
Basophils Relative: 0.6 % (ref 0.0–3.0)
Eosinophils Absolute: 0.2 10*3/uL (ref 0.0–0.7)
Eosinophils Relative: 4.8 % (ref 0.0–5.0)
HCT: 41.1 % (ref 36.0–46.0)
Hemoglobin: 13.6 g/dL (ref 12.0–15.0)
Lymphocytes Relative: 41.2 % (ref 12.0–46.0)
Lymphs Abs: 2 10*3/uL (ref 0.7–4.0)
MCHC: 33.1 g/dL (ref 30.0–36.0)
MCV: 90.6 fl (ref 78.0–100.0)
Monocytes Absolute: 0.4 10*3/uL (ref 0.1–1.0)
Monocytes Relative: 8.6 % (ref 3.0–12.0)
Neutro Abs: 2.2 10*3/uL (ref 1.4–7.7)
Neutrophils Relative %: 44.8 % (ref 43.0–77.0)
Platelets: 337 10*3/uL (ref 150.0–400.0)
RBC: 4.53 Mil/uL (ref 3.87–5.11)
RDW: 13.9 % (ref 11.5–15.5)
WBC: 4.8 10*3/uL (ref 4.0–10.5)

## 2021-11-20 LAB — LIPID PANEL
Cholesterol: 233 mg/dL — ABNORMAL HIGH (ref 0–200)
HDL: 68.2 mg/dL (ref >39.00)
LDL Cholesterol: 143 mg/dL — ABNORMAL HIGH (ref 0–99)
NonHDL: 164.95
Total CHOL/HDL Ratio: 3
Triglycerides: 112 mg/dL (ref 0.0–149.0)
VLDL: 22.4 mg/dL (ref 0.0–40.0)

## 2021-11-20 LAB — COMPREHENSIVE METABOLIC PANEL
ALT: 16 U/L (ref 0–35)
AST: 23 U/L (ref 0–37)
Albumin: 4.7 g/dL (ref 3.5–5.2)
Alkaline Phosphatase: 69 U/L (ref 39–117)
BUN: 27 mg/dL — ABNORMAL HIGH (ref 6–23)
CO2: 31 mEq/L (ref 19–32)
Calcium: 10.3 mg/dL (ref 8.4–10.5)
Chloride: 101 mEq/L (ref 96–112)
Creatinine, Ser: 1.14 mg/dL (ref 0.40–1.20)
GFR: 48.72 mL/min — ABNORMAL LOW (ref >60.00)
Glucose, Bld: 98 mg/dL (ref 70–99)
Potassium: 4.3 mEq/L (ref 3.5–5.1)
Sodium: 140 mEq/L (ref 135–145)
Total Bilirubin: 0.5 mg/dL (ref 0.2–1.2)
Total Protein: 7.5 g/dL (ref 6.0–8.3)

## 2021-11-20 LAB — VITAMIN D 25 HYDROXY (VIT D DEFICIENCY, FRACTURES): VITD: 119.08 ng/mL (ref 30.00–100.00)

## 2021-11-20 MED ORDER — TRAZODONE HCL 50 MG PO TABS: 50.0000 mg | ORAL_TABLET | Freq: Every evening | ORAL | 3 refills | Status: DC | PRN

## 2021-11-20 NOTE — Progress Notes (Signed)
Established Patient Office Visit     This visit occurred during the SARS-CoV-2 public health emergency.  Safety protocols were in place, including screening questions prior to the visit, additional usage of staff PPE, and extensive cleaning of exam room while observing appropriate contact time as indicated for disinfecting solutions.    CC/Reason for Visit: Subsequent Medicare wellness visit and yearly follow-up chronic conditions  HPI: Tracy Pena is a 71 y.o. female who is coming in today for the above mentioned reasons. Past Medical History is significant for: Insomnia, hyperlipidemia, vitamin D deficiency and osteopenia.  She has been doing well and has no acute complaints or concerns.  She did have breast reduction earlier in 2022 and has recovered well.  She had a flu vaccine in November.  She is due for COVID booster and will be due for Tdap update over the summer.  She is now overdue for colonoscopy, mammogram as well as a bone density.  She will be due for Pap smear later this year as well.  She has routine eye and dental care.  She has chronic hearing loss and wears hearing aids.  She exercises by walking on a daily basis.   Past Medical/Surgical History: Past Medical History:  Diagnosis Date   Complication of anesthesia    nausea after anesthesia   Decreased hearing    H/O cholesteatoma    surgery [facility]   Hx of varicella    as a child   PONV (postoperative nausea and vomiting)     Past Surgical History:  Procedure Laterality Date   BREAST REDUCTION SURGERY Bilateral 12/06/2020   Procedure: MAMMARY REDUCTION  (BREAST);  Surgeon: Wallace Going, DO;  Location: [place];  Service: Plastics;  Laterality: Bilateral;  2.5 hours, please   INNER EAR SURGERY     at [facility] for Cholesteatoma artificial ear drum   REPEAT CESAREAN SECTION     had 3    Social History:  reports that she has never smoked. She has never used smokeless tobacco. She  reports current alcohol use of about 3.0 standard drinks per week. She reports that she does not use drugs.  Allergies: No Known Allergies  Family History:  Family History  Problem Relation Age of Onset   Lung cancer Mother    Thyroid disease Daughter        graves   Celiac disease Grandchild    Diabetes type I Neg Hx      Current Outpatient Medications:    acetaminophen (TYLENOL) 500 MG tablet, Take 1 tablet (500 mg total) by mouth every 6 (six) hours as needed. For use AFTER surgery, Disp: 30 tablet, Rfl: 0   benzonatate (TESSALON) 100 MG capsule, Take 1 capsule (100 mg total) by mouth 2 (two) times daily as needed for cough., Disp: 20 capsule, Rfl: 0   ibuprofen (ADVIL) 600 MG tablet, Take 1 tablet (600 mg total) by mouth every 6 (six) hours as needed for mild pain or moderate pain. For use AFTER surgery, Disp: 30 tablet, Rfl: 0   Multiple Vitamin (MULTIVITAMIN PO), Take by mouth., Disp: , Rfl:    Probiotic Product (PROBIOTIC PO), Take by mouth., Disp: , Rfl:    Cholecalciferol (VITAMIN D3 PO), Take by mouth daily., Disp: , Rfl:    traZODone (DESYREL) 50 MG tablet, Take 1 tablet (50 mg total) by mouth at bedtime as needed for sleep., Disp: 30 tablet, Rfl: 3  Review of Systems:  Constitutional: Denies fever,  chills, diaphoresis, appetite change and fatigue.  HEENT: Denies photophobia, eye pain, redness, hearing loss, ear pain, congestion, sore throat, rhinorrhea, sneezing, mouth sores, trouble swallowing, neck pain, neck stiffness and tinnitus.   Respiratory: Denies SOB, DOE, cough, chest tightness,  and wheezing.   Cardiovascular: Denies chest pain, palpitations and leg swelling.  Gastrointestinal: Denies nausea, vomiting, abdominal pain, diarrhea, constipation, blood in stool and abdominal distention.  Genitourinary: Denies dysuria, urgency, frequency, hematuria, flank pain and difficulty urinating.  Endocrine: Denies: hot or cold intolerance, sweats, changes in hair or nails,  polyuria, polydipsia. Musculoskeletal: Denies myalgias, back pain, joint swelling, arthralgias and gait problem.  Skin: Denies pallor, rash and wound.  Neurological: Denies dizziness, seizures, syncope, weakness, light-headedness, numbness and headaches.  Hematological: Denies adenopathy. Easy bruising, personal or family bleeding history  Psychiatric/Behavioral: Denies suicidal ideation, mood changes, confusion, nervousness, sleep disturbance and agitation    Physical Exam: Vitals:   11/20/21 0901  BP: 135/78  Pulse: 76  Temp: 97.9 F (36.6 C)  TempSrc: Oral  SpO2: 97%  Weight: 138 lb (62.6 kg)  Height: 5' 2.21" (1.58 m)    Body mass index is 25.07 kg/m.   Constitutional: NAD, calm, comfortable Eyes: PERRL, lids and conjunctivae normal ENMT: Mucous membranes are moist. Posterior pharynx clear of any exudate or lesions. Normal dentition. Tympanic membrane is pearly white, no erythema or bulging. Neck: normal, supple, no masses, no thyromegaly Respiratory: clear to auscultation bilaterally, no wheezing, no crackles. Normal respiratory effort. No accessory muscle use.  Cardiovascular: Regular rate and rhythm, no murmurs / rubs / gallops. No extremity edema. 2+ pedal pulses. No carotid bruits.  Abdomen: no tenderness, no masses palpated. No hepatosplenomegaly. Bowel sounds positive.  Musculoskeletal: no clubbing / cyanosis. No joint deformity upper and lower extremities. Good ROM, no contractures. Normal muscle tone.  Skin: no rashes, lesions, ulcers. No induration Neurologic: CN 2-12 grossly intact. Sensation intact, DTR normal. Strength 5/5 in all 4.  Psychiatric: Normal judgment and insight. Alert and oriented x 3. Normal mood.    Subsequent Medicare wellness visit   1. Risk factors, based on past  M,S,F -cardiovascular disease risk factors include age and hyperlipidemia   2.  Physical activities: Walks daily   3.  Depression/mood: Stable not depressed   4.  Hearing:  Chronic hearing loss, wears bilateral hearing aids, no worsening in the past year.   5.  ADL's: Independent in all ADLs   6.  Fall risk: Low fall risk   7.  Home safety: No problems identified   8.  Height weight, and visual acuity: height and weight as above, vision:  Vision Screening   Right eye Left eye Both eyes  Without correction     With correction 20/20 20/15 20/15      9.  Counseling: Advise she update vaccination status and age-appropriate cancer screening   10. Lab orders based on risk factors: Laboratory update will be reviewed   11. Referral : GI for colonoscopy, mammogram, DEXA   12. Care plan: Follow-up with me in 1 year   13. Cognitive assessment: No cognitive impairment   14. Screening: Patient provided with a written and personalized 5-10 year screening schedule in the AVS. yes   15. Provider List Update: PCP, plastic surgeon Dr. Marla Roe  16. Advance Directives: Full code   17. Opioids: Patient is not on any opioid prescriptions and has no risk factors for a substance use disorder.   [place] Office Visit from 06/07/2020 in [place] at [place]  PHQ-9 Total Score 1       Fall Risk 08/25/2018 05/21/2019 06/07/2020 12/06/2020 11/20/2021  Falls in the past year? No 0 0 - 0  Was there an injury with Fall? - 0 0 - 0  Fall Risk Category Calculator - 0 0 - 0  Fall Risk Category - Low Low - Low  Patient Fall Risk Level - - - Low fall risk Low fall risk  Patient at Risk for Falls Due to - - - - No Fall Risks  Fall risk Follow up - - - - Falls evaluation completed     Impression and Plan:  Encounter for subsequent annual wellness visit (AWV) in Medicare patient -Recommend routine eye and dental care. -Immunizations: She will update her COVID booster, she will be due for Tdap in July. -Healthy lifestyle discussed in detail. -Labs to be updated today. -Colon cancer screening: She received her letter to schedule, she will call, last  colonoscopy was in 2012. -Breast cancer screening: Last in 02/2020, referral placed today. -Cervical cancer screening: Will be due for Pap smear in 04/2022, can update at next visit -Lung cancer screening: Not applicable -Prostate cancer screening: Not applicable -DEXA: 0/3009, ordered today  Insomnia, unspecified type  - Plan: traZODone (DESYREL) 50 MG tablet  Mixed hyperlipidemia  - Plan: CBC with Differential/Platelet, Comprehensive metabolic panel, Lipid panel -Last lipid panel in 2021 with a total cholesterol of 212, triglycerides 128 and LDL 118, not currently on statin.  S/P bilateral breast reduction -Noted, recovered well  Vitamin D deficiency  - Plan: VITAMIN D 25 Hydroxy (Vit-D Deficiency, Fractures)  Osteopenia, unspecified location -Due for repeat DEXA this year.    Patient Instructions  -Nice seeing you today!!  -Lab work today; will notify you once results are available.  -Remember your COVID booster.  -Tdap will be due in July.  -Remember to schedule your colonoscopy.  -We will put in referrals for your mammogram and bone density test.    Lelon Frohlich, MD [facility] at [facility]

## 2021-11-20 NOTE — Telephone Encounter (Signed)
Dr Jerilee Hoh reviewed critical vitamin D. Verbal order received to notify pt to d/c vit D supplements if taking.  Pt notified; states she takes an OTC vitamin D. Pt  instructed to d/c due to elevated levels. Pt verb understanding.

## 2021-11-20 NOTE — Patient Instructions (Signed)
-  Nice seeing you today!!  -Lab work today; will notify you once results are available.  -Remember your COVID booster.  -Tdap will be due in July.  -Remember to schedule your colonoscopy.  -We will put in referrals for your mammogram and bone density test.

## 2021-11-20 NOTE — Addendum Note (Signed)
Addended by: Elza Rafter D on: 11/20/2021 11:15 AM   Modules accepted: Orders

## 2021-11-20 NOTE — Telephone Encounter (Signed)
Note needed to close encounter 

## 2021-11-20 NOTE — Telephone Encounter (Signed)
Santiago Glad with [facility] called with critical lab value Vitamin 119.

## 2021-11-21 ENCOUNTER — Other Ambulatory Visit: Payer: Self-pay | Admitting: Internal Medicine

## 2021-11-21 ENCOUNTER — Encounter: Payer: Self-pay | Admitting: Internal Medicine

## 2021-11-21 DIAGNOSIS — E782 Mixed hyperlipidemia: Secondary | ICD-10-CM

## 2021-11-21 DIAGNOSIS — N183 Chronic kidney disease, stage 3 unspecified: Secondary | ICD-10-CM | POA: Insufficient documentation

## 2021-11-21 MED ORDER — ATORVASTATIN CALCIUM 20 MG PO TABS: 20.0000 mg | ORAL_TABLET | Freq: Every day | ORAL | 1 refills | Status: DC

## 2021-11-21 NOTE — Progress Notes (Signed)
1. Vit D is elevated. Stop all Vit D supplements and multivitamins. Recheck vit D in 3 months.  2. Cholesterol is higher than previous. In addition to continued lifestyle changes, I would start lipitor 20 mg at bedtime and recheck lipids in 3 months.  3. She has stage 3 CKD that is chronic (at least 5 years back in chart). Nothing to do but observation for now.  Rest of labs look good.

## 2021-11-23 ENCOUNTER — Other Ambulatory Visit: Payer: Self-pay

## 2021-11-23 DIAGNOSIS — E782 Mixed hyperlipidemia: Secondary | ICD-10-CM

## 2021-11-23 DIAGNOSIS — E559 Vitamin D deficiency, unspecified: Secondary | ICD-10-CM

## 2021-11-29 ENCOUNTER — Encounter: Payer: Self-pay | Admitting: Gastroenterology

## 2022-01-01 ENCOUNTER — Ambulatory Visit (AMBULATORY_SURGERY_CENTER): Payer: Medicare Other | Admitting: *Deleted

## 2022-01-01 ENCOUNTER — Other Ambulatory Visit: Payer: Self-pay

## 2022-01-01 VITALS — Ht 62.0 in | Wt 135.0 lb

## 2022-01-01 DIAGNOSIS — Z1211 Encounter for screening for malignant neoplasm of colon: Secondary | ICD-10-CM

## 2022-01-01 NOTE — Progress Notes (Signed)

## 2022-01-15 ENCOUNTER — Ambulatory Visit (AMBULATORY_SURGERY_CENTER): Payer: Medicare Other | Admitting: Gastroenterology

## 2022-01-15 ENCOUNTER — Other Ambulatory Visit: Payer: Self-pay | Admitting: Gastroenterology

## 2022-01-15 ENCOUNTER — Encounter: Payer: Self-pay | Admitting: Gastroenterology

## 2022-01-15 VITALS — BP 135/58 | HR 60 | Temp 97.0°F | Resp 14 | Ht 62.0 in | Wt 135.0 lb

## 2022-01-15 DIAGNOSIS — Z1211 Encounter for screening for malignant neoplasm of colon: Secondary | ICD-10-CM | POA: Diagnosis not present

## 2022-01-15 DIAGNOSIS — D120 Benign neoplasm of cecum: Secondary | ICD-10-CM | POA: Diagnosis not present

## 2022-01-15 DIAGNOSIS — D123 Benign neoplasm of transverse colon: Secondary | ICD-10-CM

## 2022-01-15 DIAGNOSIS — D122 Benign neoplasm of ascending colon: Secondary | ICD-10-CM | POA: Diagnosis not present

## 2022-01-15 MED ORDER — SODIUM CHLORIDE 0.9 % IV SOLN: 500.0000 mL | Freq: Once | INTRAVENOUS | Status: DC

## 2022-01-15 NOTE — Op Note (Signed)
[place] ?Patient Name: Tracy Pena ?Procedure Date: 01/15/2022 9:33 AM ?MRN: [id number] ?Endoscopist: Thornton Park MD, MD ?Age: 71 ?Referring MD:  ?Date of Birth: 1950/11/19 ?Gender: Female ?Account #: [id number] ?Procedure:                Colonoscopy ?Indications:              Screening for colorectal malignant neoplasm ?                          No known family history of colon cancer or polyps ?                          Normal colonoscopy with Dr. Olevia Perches 2012 ?Medicines:                Monitored Anesthesia Care ?Procedure:                Pre-Anesthesia Assessment: ?                          - Prior to the procedure, a History and Physical  ?                          was performed, and patient medications and  ?                          allergies were reviewed. The patient's tolerance of  ?                          previous anesthesia was also reviewed. The risks  ?                          and benefits of the procedure and the sedation  ?                          options and risks were discussed with the patient.  ?                          All questions were answered, and informed consent  ?                          was obtained. Prior Anticoagulants: The patient has  ?                          taken no previous anticoagulant or antiplatelet  ?                          agents. ASA Grade Assessment: II - A patient with  ?                          mild systemic disease. After reviewing the risks  ?                          and benefits, the patient was deemed in  ?  satisfactory condition to undergo the procedure. ?                          After obtaining informed consent, the colonoscope  ?                          was passed under direct vision. Throughout the  ?                          procedure, the patient's blood pressure, pulse, and  ?                          oxygen saturations were monitored continuously. The  ?                          Colonoscope was  introduced through the anus and  ?                          advanced to the 4 cm into the ileum. A second  ?                          forward view of the right colon was performed. The  ?                          Olympus PCF-H190DL (EQ#[id number]) Colonoscope was  ?                          introduced through the and advanced to the. The  ?                          colonoscopy was performed without difficulty. The  ?                          patient tolerated the procedure well. The quality  ?                          of the bowel preparation was good. The terminal  ?                          ileum, ileocecal valve, appendiceal orifice, and  ?                          rectum were photographed. ?Scope In: 9:45:53 AM ?Scope Out: 10:11:49 AM ?Total Procedure Duration: 0 hours 25 minutes 56 seconds  ?Findings:                 The perianal and digital rectal examinations were  ?                          normal. ?                          A 2 mm polyp was found in the transverse colon. The  ?  polyp was flat. The polyp was removed with a  ?                          piecemeal technique using a cold snare. Resection  ?                          and retrieval were complete. Estimated blood loss  ?                          was minimal. ?                          Two sessile polyps were found in the cecum. The  ?                          polyps were 5 to 6 mm in size. These polyps were  ?                          removed with a cold snare. Resection and retrieval  ?                          were complete. Estimated blood loss was minimal. ?                          A 6 mm polyp was found in the ascending colon. The  ?                          polyp was sessile. The polyp was removed with a  ?                          cold snare. Resection and retrieval were complete.  ?                          Estimated blood loss was minimal. ?                          A 8 mm polyp was found in the hepatic flexure. The  ?                           polyp was sessile. The polyp was removed with a  ?                          piecemeal technique using a cold snare. Resection  ?                          and retrieval were complete. ?Complications:            No immediate complications. ?Estimated Blood Loss:     Estimated blood loss was minimal. ?Impression:               - One 2 mm polyp in the transverse colon, removed  ?  piecemeal using a cold snare. Resected and  ?                          retrieved. ?                          - Two 5 to 6 mm polyps in the cecum, removed with a  ?                          cold snare. Resected and retrieved. ?                          - One 6 mm polyp in the ascending colon, removed  ?                          with a cold snare. Resected and retrieved. ?                          - One 8 mm polyp at the hepatic flexure, removed  ?                          piecemeal using a cold snare. Resected and  ?                          retrieved. ?Recommendation:           - Patient has a contact number available for  ?                          emergencies. The signs and symptoms of potential  ?                          delayed complications were discussed with the  ?                          patient. Return to normal activities tomorrow.  ?                          Written discharge instructions were provided to the  ?                          patient. ?                          - Resume previous diet. ?                          - Continue present medications. ?                          - Await pathology results. ?                          - Repeat colonoscopy in 3 years for surveillance. ?                          -  Emerging evidence supports eating a diet of  ?                          fruits, vegetables, grains, calcium, and yogurt  ?                          while reducing red meat and alcohol may reduce the  ?                          risk of colon cancer. ?                          -  Thank you for allowing me to be involved in your  ?                          colon cancer prevention. ?Thornton Park MD, MD ?01/15/2022 10:17:50 AM ?This report has been signed electronically. ?

## 2022-01-15 NOTE — Progress Notes (Signed)
PT taken to PACU. Monitors in place. VSS. Report given to RN. 

## 2022-01-15 NOTE — Progress Notes (Signed)
? ?Referring Provider: Isaac Bliss, [place]* ?Primary Care Physician:  Isaac Bliss, Rayford Halsted, MD ? ?Reason for Procedure:  Colon cancer screening ? ? ?IMPRESSION:  ?Need for colon cancer screening ?Appropriate candidate for monitored anesthesia care ? ?PLAN: ?Colonoscopy in the [place] today ? ? ?HPI: Tracy Pena is a 71 y.o. female presents for screening colonoscopy. ? ?Normal colonoscopy with Dr. Olevia Perches 02/20/2011. ? ?No baseline GI symptoms.  ? ?No known family history of colon cancer or polyps. No family history of uterine/endometrial cancer, pancreatic cancer or gastric/stomach cancer. ? ? ?Past Medical History:  ?Diagnosis Date  ? Cataract   ? "small"  ? Chronic kidney disease   ? CKD  ? Complication of anesthesia   ? nausea after anesthesia  ? Decreased hearing   ? H/O cholesteatoma   ? surgery [facility]  ? Hx of varicella   ? as a child  ? Hyperlipidemia   ? Osteopenia   ? 3 YEARS AGO UPDATED 01/01/22  ? PONV (postoperative nausea and vomiting)   ? ? ?Past Surgical History:  ?Procedure Laterality Date  ? BREAST REDUCTION SURGERY Bilateral 12/06/2020  ? Procedure: MAMMARY REDUCTION  (BREAST);  Surgeon: Wallace Going, DO;  Location: [facility];  Service: Plastics;  Laterality: Bilateral;  2.5 hours, please  ? COLONOSCOPY    ? INNER EAR SURGERY    ? at [facility] for Cholesteatoma artificial ear drum,2 ear surgeries updated 01/01/22  ? REPEAT CESAREAN SECTION    ? had 3  ? ? ?Current Outpatient Medications  ?Medication Sig Dispense Refill  ? atorvastatin (LIPITOR) 20 MG tablet Take 1 tablet (20 mg total) by mouth daily. 90 tablet 1  ? Calcium Carbonate (CALCIUM 600 PO) Take by mouth.    ? traZODone (DESYREL) 50 MG tablet Take 1 tablet (50 mg total) by mouth at bedtime as needed for sleep. 30 tablet 3  ? acetaminophen (TYLENOL) 500 MG tablet Take 1 tablet (500 mg total) by mouth every 6 (six) hours as needed. For use AFTER surgery (Patient not taking: Reported on 01/01/2022) 30 tablet 0   ? ?Current Facility-Administered Medications  ?Medication Dose Route Frequency Provider Last Rate Last Admin  ? 0.9 %  sodium chloride infusion  500 mL Intravenous Once Isaac Bliss, Rayford Halsted, MD      ? ? ?Allergies as of 01/15/2022  ? (No Known Allergies)  ? ? ?Family History  ?Problem Relation Age of Onset  ? Lung cancer Mother   ? Thyroid disease Daughter   ?     graves  ? Celiac disease Grandchild   ? Diabetes type I Neg Hx   ? Colon cancer Neg Hx   ? Colon polyps Neg Hx   ? Esophageal cancer Neg Hx   ? Stomach cancer Neg Hx   ? Rectal cancer Neg Hx   ? ? ? ?Physical Exam: ?General:   Alert,  well-nourished, pleasant and cooperative in NAD ?Head:  Normocephalic and atraumatic. ?Eyes:  Sclera clear, no icterus.   Conjunctiva pink. ?Mouth:  No deformity or lesions.   ?Neck:  Supple; no masses or thyromegaly. ?Lungs:  Clear throughout to auscultation.   No wheezes. ?Heart:  Regular rate and rhythm; no murmurs. ?Abdomen:  Soft, non-tender, nondistended, normal bowel sounds, no rebound or guarding.  ?Msk:  Symmetrical. No boney deformities ?LAD: No inguinal or umbilical LAD ?Extremities:  No clubbing or edema. ?Neurologic:  Alert and  oriented x4;  grossly nonfocal ?Skin:  No obvious rash or  bruise. ?Psych:  Alert and cooperative. Normal mood and affect. ? ? ? ? ?Studies/Results: ?No results found. ? ? ? ?Nephi Savage L. Tarri Glenn, MD, MPH ?01/15/2022, 9:35 AM ? ? ? ?  ?

## 2022-01-15 NOTE — Patient Instructions (Signed)
Handout given for polyps.  YOU HAD AN ENDOSCOPIC PROCEDURE TODAY AT THE [facility]:   Refer to the procedure report that was given to you for any specific questions about what was found during the examination.  If the procedure report does not answer your questions, please call your gastroenterologist to clarify.  If you requested that your care partner not be given the details of your procedure findings, then the procedure report has been included in a sealed envelope for you to review at your convenience later.  YOU SHOULD EXPECT: Some feelings of bloating in the abdomen. Passage of more gas than usual.  Walking can help get rid of the air that was put into your GI tract during the procedure and reduce the bloating. If you had a lower endoscopy (such as a colonoscopy or flexible sigmoidoscopy) you may notice spotting of blood in your stool or on the toilet paper. If you underwent a bowel prep for your procedure, you may not have a normal bowel movement for a few days.  Please Note:  You might notice some irritation and congestion in your nose or some drainage.  This is from the oxygen used during your procedure.  There is no need for concern and it should clear up in a day or so.  SYMPTOMS TO REPORT IMMEDIATELY:   Following lower endoscopy (colonoscopy or flexible sigmoidoscopy):  Excessive amounts of blood in the stool  Significant tenderness or worsening of abdominal pains  Swelling of the abdomen that is new, acute  Fever of 100F or higher  For urgent or emergent issues, a gastroenterologist can be reached at any hour by calling [phone]. Do not use MyChart messaging for urgent concerns.    DIET:  We do recommend a small meal at first, but then you may proceed to your regular diet.  Drink plenty of fluids but you should avoid alcoholic beverages for 24 hours.  ACTIVITY:  You should plan to take it easy for the rest of today and you should NOT DRIVE or use heavy  machinery until tomorrow (because of the sedation medicines used during the test).    FOLLOW UP: Our staff will call the number listed on your records 48-72 hours following your procedure to check on you and address any questions or concerns that you may have regarding the information given to you following your procedure. If we do not reach you, we will leave a message.  We will attempt to reach you two times.  During this call, we will ask if you have developed any symptoms of COVID 19. If you develop any symptoms (ie: fever, flu-like symptoms, shortness of breath, cough etc.) before then, please call [phone].  If you test positive for Covid 19 in the 2 weeks post procedure, please call and report this information to us.    If any biopsies were taken you will be contacted by phone or by letter within the next 1-3 weeks.  Please call us at [phone] if you have not heard about the biopsies in 3 weeks.    SIGNATURES/CONFIDENTIALITY: You and/or your care partner have signed paperwork which will be entered into your electronic medical record.  These signatures attest to the fact that that the information above on your After Visit Summary has been reviewed and is understood.  Full responsibility of the confidentiality of this discharge information lies with you and/or your care-partner. 

## 2022-01-15 NOTE — Progress Notes (Signed)
VS-CW  Pt's states no medical or surgical changes since previsit or office visit.  

## 2022-01-17 ENCOUNTER — Encounter: Payer: Self-pay | Admitting: Gastroenterology

## 2022-01-17 ENCOUNTER — Telehealth: Payer: Self-pay

## 2022-01-17 NOTE — Telephone Encounter (Signed)
?  Follow up Call- ? ? ?  01/15/2022  ?  9:20 AM  ?Call back number  ?Post procedure Call Back phone  # [phone]  ?Permission to leave phone message Yes  ?  ? ?Patient questions: ? ?Do you have a fever, pain , or abdominal swelling? No. ?Pain Score  0 * ? ?Have you tolerated food without any problems? Yes.   ? ?Have you been able to return to your normal activities? Yes.   ? ?Do you have any questions about your discharge instructions: ?Diet   No. ?Medications  No. ?Follow up visit  No. ? ?Do you have questions or concerns about your Care? No. ? ?Actions: ?* If pain score is 4 or above: ?No action needed, pain <4. ? ? ?

## 2022-01-17 NOTE — Telephone Encounter (Signed)
?  Follow up Call- ? ? ?  01/15/2022  ?  9:20 AM  ?Call back number  ?Post procedure Call Back phone  # [phone]  ?Permission to leave phone message Yes  ?  ? ?Left message ?

## 2022-02-07 ENCOUNTER — Ambulatory Visit
Admission: RE | Admit: 2022-02-07 | Discharge: 2022-02-07 | Disposition: A | Discharge status: Home / Self Care | Payer: Medicare Other | Source: Ambulatory Visit | Attending: Internal Medicine | Admitting: Internal Medicine

## 2022-02-07 DIAGNOSIS — Z13820 Encounter for screening for osteoporosis: Secondary | ICD-10-CM

## 2022-02-07 DIAGNOSIS — Z1231 Encounter for screening mammogram for malignant neoplasm of breast: Secondary | ICD-10-CM | POA: Diagnosis not present

## 2022-02-07 DIAGNOSIS — M810 Age-related osteoporosis without current pathological fracture: Secondary | ICD-10-CM | POA: Diagnosis not present

## 2022-02-07 DIAGNOSIS — Z780 Asymptomatic menopausal state: Secondary | ICD-10-CM

## 2022-02-07 DIAGNOSIS — M8588 Other specified disorders of bone density and structure, other site: Secondary | ICD-10-CM | POA: Diagnosis not present

## 2022-02-07 DIAGNOSIS — M8580 Other specified disorders of bone density and structure, unspecified site: Secondary | ICD-10-CM

## 2022-02-12 ENCOUNTER — Encounter: Payer: Self-pay | Admitting: Internal Medicine

## 2022-02-19 ENCOUNTER — Encounter: Payer: Self-pay | Admitting: Internal Medicine

## 2022-02-19 ENCOUNTER — Telehealth (INDEPENDENT_AMBULATORY_CARE_PROVIDER_SITE_OTHER): Payer: Medicare Other | Admitting: Internal Medicine

## 2022-02-19 VITALS — Wt 135.0 lb

## 2022-02-19 DIAGNOSIS — M810 Age-related osteoporosis without current pathological fracture: Secondary | ICD-10-CM | POA: Diagnosis not present

## 2022-02-19 DIAGNOSIS — G4700 Insomnia, unspecified: Secondary | ICD-10-CM

## 2022-02-19 MED ORDER — TRAZODONE HCL 50 MG PO TABS: 50.0000 mg | ORAL_TABLET | Freq: Every evening | ORAL | 1 refills | Status: DC | PRN

## 2022-02-19 MED ORDER — ALENDRONATE SODIUM 70 MG PO TABS: 70.0000 mg | ORAL_TABLET | ORAL | 11 refills | Status: DC

## 2022-02-19 NOTE — Progress Notes (Signed)
? ? ?Virtual Visit via Video Note ? ?I connected with Tracy Pena on 02/19/22 at 11:30 AM EDT by a video enabled telemedicine application and verified that I am speaking with the correct person using two identifiers. ? ?Location patient: home ?Location provider: work office ?Persons participating in the virtual visit: patient, provider ? ?I discussed the limitations of evaluation and management by telemedicine and the availability of in person appointments. The patient expressed understanding and agreed to proceed. ? ? ?HPI: ?This visit has been scheduled to discuss her recent DEXA scan results.  She had a left femoral neck T score of -2.6 and a left forearm of -2.8.  This shows progression of her bone loss.  No pathological fractures. ? ? ?ROS: ?Constitutional: Denies fever, chills, diaphoresis, appetite change and fatigue.  ?HEENT: Denies photophobia, eye pain, redness, hearing loss, ear pain, congestion, sore throat, rhinorrhea, sneezing, mouth sores, trouble swallowing, neck pain, neck stiffness and tinnitus.   ?Respiratory: Denies SOB, DOE, cough, chest tightness,  and wheezing.   ?Cardiovascular: Denies chest pain, palpitations and leg swelling.  ?Gastrointestinal: Denies nausea, vomiting, abdominal pain, diarrhea, constipation, blood in stool and abdominal distention.  ?Genitourinary: Denies dysuria, urgency, frequency, hematuria, flank pain and difficulty urinating.  ?Endocrine: Denies: hot or cold intolerance, sweats, changes in hair or nails, polyuria, polydipsia. ?Musculoskeletal: Denies myalgias, back pain, joint swelling, arthralgias and gait problem.  ?Skin: Denies pallor, rash and wound.  ?Neurological: Denies dizziness, seizures, syncope, weakness, light-headedness, numbness and headaches.  ?Hematological: Denies adenopathy. Easy bruising, personal or family bleeding history  ?Psychiatric/Behavioral: Denies suicidal ideation, mood changes, confusion, nervousness, sleep disturbance and  agitation ? ? ?Past Medical History:  ?Diagnosis Date  ? Cataract   ? "small"  ? Chronic kidney disease   ? CKD  ? Complication of anesthesia   ? nausea after anesthesia  ? Decreased hearing   ? H/O cholesteatoma   ? [facility]  ? Hx of varicella   ? as a child  ? Hyperlipidemia   ? Osteopenia   ? 3 YEARS AGO UPDATED 01/01/22  ? PONV (postoperative nausea and vomiting)   ? ? ?Past Surgical History:  ?Procedure Laterality Date  ? BREAST REDUCTION SURGERY Bilateral 12/06/2020  ? Procedure: MAMMARY REDUCTION  (BREAST);  Surgeon: Wallace Going, DO;  Location: [facility];  Service: Plastics;  Laterality: Bilateral;  2.5 hours, please  ? COLONOSCOPY    ? INNER EAR SURGERY    ? at [facility] for Cholesteatoma artificial ear drum,2 ear surgeries updated 01/01/22  ? REPEAT CESAREAN SECTION    ? had 3  ? ? ?Family History  ?Problem Relation Age of Onset  ? Lung cancer Mother   ? Thyroid disease Daughter   ?     graves  ? Celiac disease Grandchild   ? Diabetes type I Neg Hx   ? Colon cancer Neg Hx   ? Colon polyps Neg Hx   ? Esophageal cancer Neg Hx   ? Stomach cancer Neg Hx   ? Rectal cancer Neg Hx   ? Breast cancer Neg Hx   ? ? ?SOCIAL HX:  ? reports that she has never smoked. She has never been exposed to tobacco smoke. She has never used smokeless tobacco. She reports current alcohol use of about 3.0 standard drinks per week. She reports that she does not use drugs. ? ? ?Current Outpatient Medications:  ?  alendronate (FOSAMAX) 70 MG tablet, Take 1 tablet (70 mg total) by mouth  every 7 (seven) days. Take with a full glass of water on an empty stomach., Disp: 4 tablet, Rfl: 11 ?  atorvastatin (LIPITOR) 20 MG tablet, Take 1 tablet (20 mg total) by mouth daily., Disp: 90 tablet, Rfl: 1 ?  Calcium Carbonate (CALCIUM 600 PO), Take by mouth., Disp: , Rfl:  ?  traZODone (DESYREL) 50 MG tablet, Take 1 tablet (50 mg total) by mouth at bedtime as needed for sleep., Disp: 90 tablet, Rfl: 1 ? ?EXAM:  ? ?VITALS per  patient if applicable: None reported ? ?GENERAL: alert, oriented, appears well and in no acute distress ? ?HEENT: atraumatic, conjunttiva clear, no obvious abnormalities on inspection of external nose and ears ? ?NECK: normal movements of the head and neck ? ?LUNGS: on inspection no signs of respiratory distress, breathing rate appears normal, no obvious gross increased work of breathing, gasping or wheezing ? ?CV: no obvious cyanosis ? ?MS: moves all visible extremities without noticeable abnormality ? ?PSYCH/NEURO: pleasant and cooperative, no obvious depression or anxiety, speech and thought processing grossly intact ? ?ASSESSMENT AND PLAN: ? ? ?Age-related osteoporosis without current pathological fracture - Plan: alendronate (FOSAMAX) 70 MG tablet ? ?Insomnia, unspecified type - Plan: traZODone (DESYREL) 50 MG tablet ? ?-We have discussed starting alendronate once a week.  Repeat DEXA scan in 2 years. ? ?  ?I discussed the assessment and treatment plan with the patient. The patient was provided an opportunity to ask questions and all were answered. The patient agreed with the plan and demonstrated an understanding of the instructions. ?  ?The patient was advised to call back or seek an in-person evaluation if the symptoms worsen or if the condition fails to improve as anticipated. ? ? ? ?Lelon Frohlich, MD  ?[facility] ? ?

## 2022-02-21 ENCOUNTER — Other Ambulatory Visit: Payer: Self-pay | Admitting: Internal Medicine

## 2022-02-21 ENCOUNTER — Other Ambulatory Visit (INDEPENDENT_AMBULATORY_CARE_PROVIDER_SITE_OTHER): Payer: Medicare Other

## 2022-02-21 ENCOUNTER — Encounter: Payer: Self-pay | Admitting: Internal Medicine

## 2022-02-21 DIAGNOSIS — E559 Vitamin D deficiency, unspecified: Secondary | ICD-10-CM | POA: Diagnosis not present

## 2022-02-21 DIAGNOSIS — E782 Mixed hyperlipidemia: Secondary | ICD-10-CM

## 2022-02-21 LAB — LIPID PANEL
Cholesterol: 148 mg/dL (ref 0–200)
HDL: 73.3 mg/dL (ref >39.00)
LDL Cholesterol: 52 mg/dL (ref 0–99)
NonHDL: 74.49
Total CHOL/HDL Ratio: 2
Triglycerides: 114 mg/dL (ref 0.0–149.0)
VLDL: 22.8 mg/dL (ref 0.0–40.0)

## 2022-02-21 LAB — VITAMIN D 25 HYDROXY (VIT D DEFICIENCY, FRACTURES): VITD: 58.5 ng/mL (ref 30.00–100.00)

## 2022-03-11 ENCOUNTER — Encounter: Payer: Self-pay | Admitting: Internal Medicine

## 2022-03-11 DIAGNOSIS — Z461 Encounter for fitting and adjustment of hearing aid: Secondary | ICD-10-CM

## 2022-03-26 DIAGNOSIS — H906 Mixed conductive and sensorineural hearing loss, bilateral: Secondary | ICD-10-CM | POA: Diagnosis not present

## 2022-04-01 ENCOUNTER — Encounter: Payer: Self-pay | Admitting: Internal Medicine

## 2022-04-01 ENCOUNTER — Other Ambulatory Visit: Payer: Self-pay | Admitting: Internal Medicine

## 2022-04-01 ENCOUNTER — Other Ambulatory Visit: Payer: Self-pay | Admitting: *Deleted

## 2022-04-01 DIAGNOSIS — G4700 Insomnia, unspecified: Secondary | ICD-10-CM

## 2022-04-01 MED ORDER — TRAZODONE HCL 50 MG PO TABS: 50.0000 mg | ORAL_TABLET | Freq: Every evening | ORAL | 3 refills | Status: DC | PRN

## 2022-04-09 ENCOUNTER — Encounter: Payer: Self-pay | Admitting: Internal Medicine

## 2022-04-16 NOTE — Telephone Encounter (Signed)
[employer] benefits submitted, awaiting insurance determination.

## 2022-07-08 ENCOUNTER — Other Ambulatory Visit: Payer: Self-pay | Admitting: Internal Medicine

## 2022-07-08 DIAGNOSIS — G4700 Insomnia, unspecified: Secondary | ICD-10-CM

## 2022-08-30 ENCOUNTER — Other Ambulatory Visit: Payer: Self-pay | Admitting: Internal Medicine

## 2022-08-30 DIAGNOSIS — E782 Mixed hyperlipidemia: Secondary | ICD-10-CM

## 2022-09-20 ENCOUNTER — Encounter: Payer: Self-pay | Admitting: Internal Medicine

## 2022-09-20 DIAGNOSIS — M25559 Pain in unspecified hip: Secondary | ICD-10-CM

## 2022-10-01 ENCOUNTER — Ambulatory Visit: Payer: Medicare Other | Admitting: Orthopaedic Surgery

## 2022-11-06 ENCOUNTER — Ambulatory Visit (INDEPENDENT_AMBULATORY_CARE_PROVIDER_SITE_OTHER): Payer: Medicare Other

## 2022-11-06 ENCOUNTER — Encounter: Payer: Self-pay | Admitting: Orthopaedic Surgery

## 2022-11-06 ENCOUNTER — Ambulatory Visit (INDEPENDENT_AMBULATORY_CARE_PROVIDER_SITE_OTHER): Payer: Medicare Other | Admitting: Orthopaedic Surgery

## 2022-11-06 DIAGNOSIS — M25551 Pain in right hip: Secondary | ICD-10-CM

## 2022-11-06 NOTE — Progress Notes (Signed)
Office Visit Note   Patient: Tracy Pena           Date of Birth: 01-28-1951           MRN: [id number] Visit Date: 11/06/2022              Requested by: Isaac Bliss, Rayford Halsted, MD [place],  [place] [id number] PCP: Isaac Bliss, Rayford Halsted, MD   Assessment & Plan: Visit Diagnoses:  1. Pain in right hip     Plan: Impression is right trochanteric hip pain.  Possible etiologies explained.  Over-the-counter NSAIDs have not been effective so far.  She would like to try physical therapy and iontophoresis.  Referral has been placed.  Will see her back as needed.  Follow-Up Instructions: No follow-ups on file.   Orders:  Orders Placed This Encounter  Procedures   XR Lumbar Spine 2-3 Views   Ambulatory referral to Physical Therapy   No orders of the defined types were placed in this encounter.     Procedures: No procedures performed   Clinical Data: No additional findings.   Subjective: Chief Complaint  Patient presents with   Right Hip - Pain    HPI Tracy Pena is a very pleasant 72 year old female who is here for evaluation of lateral right hip pain.  Reports to take pain at night and occasionally radiates down to the knee.  Has tried heat and topical creams without significant relief.  Denies any back pain or groin pain.  Denies any radicular symptoms. Review of Systems  Constitutional: Negative.   HENT: Negative.    Eyes: Negative.   Respiratory: Negative.    Cardiovascular: Negative.   Endocrine: Negative.   Musculoskeletal: Negative.   Neurological: Negative.   Hematological: Negative.   Psychiatric/Behavioral: Negative.    All other systems reviewed and are negative.    Objective: Vital Signs: There were no vitals taken for this visit.  Physical Exam Vitals and nursing note reviewed.  Constitutional:      Appearance: She is well-developed.  HENT:     Head: Atraumatic.     Nose: Nose normal.  Eyes:     Extraocular  Movements: Extraocular movements intact.  Cardiovascular:     Pulses: Normal pulses.  Pulmonary:     Effort: Pulmonary effort is normal.  Abdominal:     Palpations: Abdomen is soft.  Musculoskeletal:     Cervical back: Neck supple.  Skin:    General: Skin is warm.     Capillary Refill: Capillary refill takes less than 2 seconds.  Neurological:     Mental Status: She is alert. Mental status is at baseline.  Psychiatric:        Behavior: Behavior normal.        Thought Content: Thought content normal.        Judgment: Judgment normal.     Ortho Exam Examination of the right hip shows excellent range of motion.  Negative logroll.  No sciatic tension signs.  Negative Stinchfield.  She has trochanteric tenderness and slight discomfort with resisted hip abduction. Specialty Comments:  No specialty comments available.  Imaging: XR Lumbar Spine 2-3 Views  Result Date: 11/06/2022 Degenerative changes to the lumbar spine without acute abnormalities.  Hip joints are well-preserved.    PMFS History: Patient Active Problem List   Diagnosis Date Noted   CKD (chronic kidney disease) stage 3, GFR 30-59 ml/min ([place]) 11/21/2021   S/P bilateral breast reduction 12/20/2020   Back pain 06/15/2019  Neck pain 06/15/2019   Symptomatic mammary hypertrophy 06/15/2019   Vitamin D deficiency 05/25/2019   Osteoporosis 06/02/2016   Hyperlipidemia 11/14/2014   Anxiety state, unspecified 09/20/2013   Medication management 04/09/2013   Elevated blood pressure reading 09/18/2012   H/O cholesteatoma    Visit for preventive health examination 09/22/2011   HYPERLIPIDEMIA 05/01/2010   VAGINITIS, ATROPHIC, POSTMENOPAUSAL 02/28/2009   URINALYSIS, ABNORMAL 02/28/2009   ADVERSE REACTION TO MEDICATION 02/28/2009   DECREASED HEARING 07/01/2008   HAND PAIN, BILATERAL 07/01/2008   Insomnia 07/01/2008   Past Medical History:  Diagnosis Date   Cataract    "small"   Chronic kidney disease    CKD    Complication of anesthesia    nausea after anesthesia   Decreased hearing    H/O cholesteatoma    [facility]   Hx of varicella    as a child   Hyperlipidemia    Osteopenia    3 YEARS AGO UPDATED 01/01/22   PONV (postoperative nausea and vomiting)     Family History  Problem Relation Age of Onset   Lung cancer Mother    Thyroid disease Daughter        graves   Celiac disease Grandchild    Diabetes type I Neg Hx    Colon cancer Neg Hx    Colon polyps Neg Hx    Esophageal cancer Neg Hx    Stomach cancer Neg Hx    Rectal cancer Neg Hx    Breast cancer Neg Hx     Past Surgical History:  Procedure Laterality Date   BREAST REDUCTION SURGERY Bilateral 12/06/2020   Procedure: MAMMARY REDUCTION  (BREAST);  Surgeon: Wallace Going, DO;  Location: [facility];  Service: Plastics;  Laterality: Bilateral;  2.5 hours, please   COLONOSCOPY     INNER EAR SURGERY     at [facility] for Cholesteatoma artificial ear drum,2 ear surgeries updated 01/01/22   REPEAT CESAREAN SECTION     had 3   Social History   Occupational History   Not on file  Tobacco Use   Smoking status: Never    Passive exposure: Never   Smokeless tobacco: Never  Vaping Use   Vaping Use: Never used  Substance and Sexual Activity   Alcohol use: Yes    Alcohol/week: 3.0 standard drinks of alcohol    Types: 3 Glasses of wine per week    Comment: wine once a week   Drug use: No   Sexual activity: Not on file

## 2022-11-20 ENCOUNTER — Encounter: Payer: Self-pay | Admitting: Physical Therapy

## 2022-11-20 ENCOUNTER — Ambulatory Visit (INDEPENDENT_AMBULATORY_CARE_PROVIDER_SITE_OTHER): Payer: Medicare Other | Admitting: Physical Therapy

## 2022-11-20 DIAGNOSIS — M25651 Stiffness of right hip, not elsewhere classified: Secondary | ICD-10-CM | POA: Diagnosis not present

## 2022-11-20 DIAGNOSIS — R2689 Other abnormalities of gait and mobility: Secondary | ICD-10-CM | POA: Diagnosis not present

## 2022-11-20 DIAGNOSIS — M6281 Muscle weakness (generalized): Secondary | ICD-10-CM | POA: Diagnosis not present

## 2022-11-20 DIAGNOSIS — M25551 Pain in right hip: Secondary | ICD-10-CM | POA: Diagnosis not present

## 2022-11-20 NOTE — Therapy (Signed)
OUTPATIENT PHYSICAL THERAPY LOWER EXTREMITY EVALUATION   Patient Name: Tracy Pena MRN: [id number] DOB:[birth year], 72 y.o., female Today's Date: 11/20/2022  END OF SESSION:  PT End of Session - 11/20/22 1602     Visit Number 1    Number of Visits 13    Date for PT Re-Evaluation 01/01/23    Authorization Type MCR and [employer]    Authorization Time Period 11/20/22 to 01/15/23    Progress Note Due on Visit 10    PT Start Time 1517    PT Stop Time 1558    PT Time Calculation (min) 41 min    Activity Tolerance Patient tolerated treatment well    Behavior During Therapy [facility] for tasks assessed/performed             Past Medical History:  Diagnosis Date   Cataract    "small"   Chronic kidney disease    CKD   Complication of anesthesia    nausea after anesthesia   Decreased hearing    H/O cholesteatoma    [facility]   Hx of varicella    as a child   Hyperlipidemia    Osteopenia    3 YEARS AGO UPDATED 01/01/22   PONV (postoperative nausea and vomiting)    Past Surgical History:  Procedure Laterality Date   BREAST REDUCTION SURGERY Bilateral 12/06/2020   Procedure: MAMMARY REDUCTION  (BREAST);  Surgeon: Wallace Going, DO;  Location: [facility];  Service: Plastics;  Laterality: Bilateral;  2.5 hours, please   COLONOSCOPY     INNER EAR SURGERY     at [facility] for Cholesteatoma artificial ear drum,2 ear surgeries updated 01/01/22   REPEAT CESAREAN SECTION     had 3   Patient Active Problem List   Diagnosis Date Noted   CKD (chronic kidney disease) stage 3, GFR 30-59 ml/min ([place]) 11/21/2021   S/P bilateral breast reduction 12/20/2020   Back pain 06/15/2019   Neck pain 06/15/2019   Symptomatic mammary hypertrophy 06/15/2019   Vitamin D deficiency 05/25/2019   Osteoporosis 06/02/2016   Hyperlipidemia 11/14/2014   Anxiety state, unspecified 09/20/2013   Medication management 04/09/2013   Elevated blood pressure reading 09/18/2012   H/O  cholesteatoma    Visit for preventive health examination 09/22/2011   HYPERLIPIDEMIA 05/01/2010   VAGINITIS, ATROPHIC, POSTMENOPAUSAL 02/28/2009   URINALYSIS, ABNORMAL 02/28/2009   ADVERSE REACTION TO MEDICATION 02/28/2009   DECREASED HEARING 07/01/2008   HAND PAIN, BILATERAL 07/01/2008   Insomnia 07/01/2008    PCP: Lelon Frohlich MD   REFERRING PROVIDER: Leandrew Koyanagi, MD  REFERRING DIAG: [phone] (ICD-10-CM) - Pain in right hip  THERAPY DIAG:  Pain in right hip  Stiffness of right hip, not elsewhere classified  Muscle weakness (generalized)  Other abnormalities of gait and mobility  Rationale for Evaluation and Treatment: Rehabilitation  ONSET DATE: 11/06/2022  SUBJECTIVE:   SUBJECTIVE STATEMENT: I carried my luggage downstairs in November, it comes and goes its like a toothache all night long. I fell right after the luggage situation in early November. It is hard for me to get down and pick something up. Its just always there. The pain radiates down my whole leg at night.   PERTINENT HISTORY: Assessment & Plan: Visit Diagnoses:  1. Pain in right hip       Plan: Impression is right trochanteric hip pain.  Possible etiologies explained.  Over-the-counter NSAIDs have not been effective so far.  She would like to try physical therapy  and iontophoresis.  Referral has been placed.  Will see her back as needed.   Follow-Up Instructions: No follow-ups on file.    PAIN:  Are you having pain? Yes: NPRS scale: 3-4/10; 8-9/10 at worst when getting up from floor or couch  Pain location: lateral R hip  Pain description: toothache Aggravating factors: getting up from floor or couch, sitting down for any length of time   Relieving factors: volatarin, heat, movement   PRECAUTIONS: Other: HOH   WEIGHT BEARING RESTRICTIONS: No  FALLS:  Has patient fallen in last 6 months? Yes. Number of falls 1; FOF (-)  LIVING ENVIRONMENT: Lives with: lives with their  spouse Lives in: House/apartment Stairs: 12 STE from basement/garage with R ascending rail Has following equipment at home: None  OCCUPATION: retired   PLOF: Independent, Independent with basic ADLs, Independent with gait, and Independent with transfers  PATIENT GOALS: decrease pain, be able to walk 3 miles a day   NEXT MD VISIT: Dr. Erlinda Hong PRN   OBJECTIVE:   DIAGNOSTIC FINDINGS: Degenerative changes to the lumbar spine without acute abnormalities.  Hip  joints are well-preserved.  PATIENT SURVEYS:  FOTO 77 predicted 37  COGNITION: Overall cognitive status: Within functional limits for tasks assessed     SENSATION: Not tested  EDEMA:    MUSCLE LENGTH:  Hip flexor WNL L, mild limitation R  Quad Mild limitation B HS mild limitation B Piriformis moderate limitation R   POSTURE:   PALPATION:  No areas TTP R HS, glute/piriformis/lumbar paraspinals, TFL  LOWER EXTREMITY ROM:  Active ROM Right eval Left eval  Hip flexion    Hip extension    Hip abduction    Hip adduction    Hip internal rotation    Hip external rotation    Knee flexion    Knee extension    Ankle dorsiflexion    Ankle plantarflexion    Ankle inversion    Ankle eversion    Lumbar flexion  --- Eval mild limitation  Lumbar extension  --- Eval WNL   Lumbar lateral flexion  WNL  WNL   Lumbar rotation  WNL  WNL    (Blank rows = not tested)  LOWER EXTREMITY MMT:  MMT Right eval Left eval  Hip flexion 3 pain limited  4-  Hip extension 3- 3  Hip abduction 4 4  Hip adduction    Hip internal rotation    Hip external rotation    Knee flexion 5 5  Knee extension 5 5  Ankle dorsiflexion 5 5  Ankle plantarflexion    Ankle inversion    Ankle eversion     (Blank rows = not tested)  LOWER EXTREMITY SPECIAL TESTS:    FUNCTIONAL TESTS:    GAIT: Distance walked:  Assistive device utilized:  Level of assistance:  Comments:    TODAY'S TREATMENT:  DATE:   Eval  Objective measures/appropriate education  TherEX  Nustep L4x6 minutes BLEs only  Bridges with red TB x5 Sidelying hip ABD x5 B red TB  Figure 4 stretch 1x30 seconds B  Hip hikes 1x5 B     PATIENT EDUCATION:  Education details: HEP, POC, exam findings  Person educated: Patient Education method: Explanation, Demonstration, and Handouts Education comprehension: verbalized understanding and returned demonstration  HOME EXERCISE PROGRAM:  Access Code: [email or web address] URL: [email or web address] Date: 11/20/2022 Prepared by: Deniece Ree  Exercises - Supine Bridge with Resistance Band  - 2 x daily - 7 x weekly - 1 sets - 10 reps - 2 hold - Sidelying Hip Abduction with Resistance at Thighs  - 2 x daily - 7 x weekly - 1 sets - 10 reps - 1 hold - Supine Figure 4 Piriformis Stretch  - 2 x daily - 7 x weekly - 1 sets - 3 reps - 30 hold - Standing Hip Hiking  - 1 x daily - 7 x weekly - 3 sets - 10 reps  ASSESSMENT:  CLINICAL IMPRESSION: Patient is a 72 y.o. F who was seen today for physical therapy evaluation and treatment for R hip pain. Exam reveals generalized proximal muscle weakness, slight postural impairment, mild lumbar stiffness, hip immobility, and impaired mm flexibility. Had a fall recently but took an excellent score on Berg- 56/56. Will benefit from skilled PT services to reduce pain and assist in return to optimal level of function.   OBJECTIVE IMPAIRMENTS: decreased mobility, difficulty walking, decreased ROM, decreased strength, hypomobility, increased fascial restrictions, increased muscle spasms, impaired flexibility, and pain.   ACTIVITY LIMITATIONS: sitting, standing, squatting, stairs, transfers, and locomotion level  PARTICIPATION LIMITATIONS: driving, shopping, community activity, and yard work  PERSONAL FACTORS: Age, Behavior pattern, Fitness,  Past/current experiences, and Time since onset of injury/illness/exacerbation are also affecting patient's functional outcome.   REHAB POTENTIAL: Good  CLINICAL DECISION MAKING: Stable/uncomplicated  EVALUATION COMPLEXITY: Low   GOALS: Goals reviewed with patient? Yes  SHORT TERM GOALS: Target date: 12/11/2022   Will be compliant with appropriate progressive HEP  Baseline: Goal status: INITIAL  2.  Pain to be no more than 6/10 at worst  Baseline:  Goal status: INITIAL  3.  Muscle flexibility impairment to have improved by at least 50% all groups  Baseline:  Goal status: INITIAL  4.  Will be able to get up from the couch without increase in pain  Baseline:  Goal status: INITIAL   LONG TERM GOALS: Target date: 01/01/2023    MMT to improve by at least 1 grade in all weak groups  Baseline:  Goal status: INITIAL  2.  Pain to be no more than 3/10 at worst  Baseline:  Goal status: INITIAL  3.  Will be able to get up from the floor on a Mod(I) basis without increase in pain  Baseline:  Goal status: INITIAL  4.  Will have been able to return to regular walking program without increase in pain (goal of 3 miles) Baseline:  Goal status: INITIAL  5.  Will be compliant with appropriate progressive HEP vs gym program to maintain functional gains/prevent recurrence of pain  Baseline:  Goal status: INITIAL    PLAN:  PT FREQUENCY: 2x/week  PT DURATION: 6 weeks  PLANNED INTERVENTIONS: Therapeutic exercises, Therapeutic activity, Neuromuscular re-education, Balance training, Gait training, Patient/Family education, Self Care, Joint mobilization, Stair training, Aquatic Therapy, Dry Needling, Electrical stimulation, Cryotherapy, Moist heat, Taping, Ultrasound, Ionotophoresis '4mg'$ /ml Dexamethasone, Manual  therapy, and Re-evaluation  PLAN FOR NEXT SESSION: strength and flexibility, hip ROM    Deniece Ree PT DPT PN2  11/20/2022, 4:04 PM

## 2022-11-26 ENCOUNTER — Encounter: Payer: Self-pay | Admitting: Physical Therapy

## 2022-11-26 ENCOUNTER — Ambulatory Visit (INDEPENDENT_AMBULATORY_CARE_PROVIDER_SITE_OTHER): Payer: Medicare Other | Admitting: Physical Therapy

## 2022-11-26 DIAGNOSIS — M6281 Muscle weakness (generalized): Secondary | ICD-10-CM | POA: Diagnosis not present

## 2022-11-26 DIAGNOSIS — M25651 Stiffness of right hip, not elsewhere classified: Secondary | ICD-10-CM | POA: Diagnosis not present

## 2022-11-26 DIAGNOSIS — M25551 Pain in right hip: Secondary | ICD-10-CM

## 2022-11-26 DIAGNOSIS — R2689 Other abnormalities of gait and mobility: Secondary | ICD-10-CM | POA: Diagnosis not present

## 2022-11-26 NOTE — Therapy (Signed)
OUTPATIENT PHYSICAL THERAPY LOWER EXTREMITY TREATMENT NOTE  Patient Name: Tracy Pena MRN: [id number] DOB:08/13/1951, 72 y.o., female Today's Date: 11/26/2022  END OF SESSION:  PT End of Session - 11/26/22 1344     Visit Number 2    Number of Visits 13    Date for PT Re-Evaluation 01/01/23    Authorization Type MCR and [employer]    Authorization Time Period 11/20/22 to 01/15/23    Progress Note Due on Visit 10    PT Start Time 1344    PT Stop Time 1425    PT Time Calculation (min) 41 min    Activity Tolerance Patient tolerated treatment well    Behavior During Therapy [facility] for tasks assessed/performed              Past Medical History:  Diagnosis Date   Cataract    "small"   Chronic kidney disease    CKD   Complication of anesthesia    nausea after anesthesia   Decreased hearing    H/O cholesteatoma    [facility]   Hx of varicella    as a child   Hyperlipidemia    Osteopenia    3 YEARS AGO UPDATED 01/01/22   PONV (postoperative nausea and vomiting)    Past Surgical History:  Procedure Laterality Date   BREAST REDUCTION SURGERY Bilateral 12/06/2020   Procedure: MAMMARY REDUCTION  (BREAST);  Surgeon: Wallace Going, DO;  Location: [place];  Service: Plastics;  Laterality: Bilateral;  2.5 hours, please   COLONOSCOPY     INNER EAR SURGERY     at [facility] for Cholesteatoma artificial ear drum,2 ear surgeries updated 01/01/22   REPEAT CESAREAN SECTION     had 3   Patient Active Problem List   Diagnosis Date Noted   CKD (chronic kidney disease) stage 3, GFR 30-59 ml/min ([place]) 11/21/2021   S/P bilateral breast reduction 12/20/2020   Back pain 06/15/2019   Neck pain 06/15/2019   Symptomatic mammary hypertrophy 06/15/2019   Vitamin D deficiency 05/25/2019   Osteoporosis 06/02/2016   Hyperlipidemia 11/14/2014   Anxiety state, unspecified 09/20/2013   Medication management 04/09/2013   Elevated blood pressure reading 09/18/2012   H/O  cholesteatoma    Visit for preventive health examination 09/22/2011   HYPERLIPIDEMIA 05/01/2010   VAGINITIS, ATROPHIC, POSTMENOPAUSAL 02/28/2009   URINALYSIS, ABNORMAL 02/28/2009   ADVERSE REACTION TO MEDICATION 02/28/2009   DECREASED HEARING 07/01/2008   HAND PAIN, BILATERAL 07/01/2008   Insomnia 07/01/2008    PCP: Lelon Frohlich MD   REFERRING PROVIDER: Leandrew Koyanagi, MD  REFERRING DIAG: [id number] (ICD-10-CM) - Pain in right hip  THERAPY DIAG:  Pain in right hip  Stiffness of right hip, not elsewhere classified  Muscle weakness (generalized)  Other abnormalities of gait and mobility  Rationale for Evaluation and Treatment: Rehabilitation  ONSET DATE: 11/06/2022  SUBJECTIVE:   SUBJECTIVE STATEMENT: Pt still reporting pain with lying on her side at night.   PERTINENT HISTORY: Assessment & Plan: Visit Diagnoses:  1. Pain in right hip       Plan: Impression is right trochanteric hip pain.  Possible etiologies explained.  Over-the-counter NSAIDs have not been effective so far.  She would like to try physical therapy and iontophoresis.  Referral has been placed.  Will see her back as needed.   Follow-Up Instructions: No follow-ups on file.    PAIN:  Are you having pain? 3/10 pain  Pain location: lateral R hip  Pain  description: toothache Aggravating factors: getting up from floor or couch, sitting down for any length of time   Relieving factors: volatarin, heat, movement   PRECAUTIONS: Other: HOH   WEIGHT BEARING RESTRICTIONS: No  FALLS:  Has patient fallen in last 6 months? Yes. Number of falls 1; FOF (-)  LIVING ENVIRONMENT: Lives with: lives with their spouse Lives in: House/apartment Stairs: 12 STE from basement/garage with R ascending rail Has following equipment at home: None  OCCUPATION: retired   PLOF: Independent, Independent with basic ADLs, Independent with gait, and Independent with transfers  PATIENT GOALS: decrease pain, be able  to walk 3 miles a day   NEXT MD VISIT: Dr. Erlinda Hong PRN   OBJECTIVE:   DIAGNOSTIC FINDINGS: Degenerative changes to the lumbar spine without acute abnormalities.  Hip  joints are well-preserved.  PATIENT SURVEYS:  11/26/21: FOTO 77 predicted 64  COGNITION: Overall cognitive status: Within functional limits for tasks assessed     SENSATION: Not tested   MUSCLE LENGTH:  Hip flexor WNL L, mild limitation R  Quad Mild limitation B HS mild limitation B Piriformis moderate limitation R   POSTURE:   PALPATION:  No areas TTP R HS, glute/piriformis/lumbar paraspinals, TFL  LOWER EXTREMITY ROM:  Active ROM Right eval Left eval  Hip flexion    Hip extension    Hip abduction    Hip adduction    Hip internal rotation    Hip external rotation    Knee flexion    Knee extension    Ankle dorsiflexion    Ankle plantarflexion    Ankle inversion    Ankle eversion    Lumbar flexion  --- Eval mild limitation  Lumbar extension  --- Eval WNL   Lumbar lateral flexion  WNL  WNL   Lumbar rotation  WNL  WNL    (Blank rows = not tested)  LOWER EXTREMITY MMT:  MMT Right eval Left eval  Hip flexion 3 pain limited  4-  Hip extension 3- 3  Hip abduction 4 4  Hip adduction    Hip internal rotation    Hip external rotation    Knee flexion 5 5  Knee extension 5 5  Ankle dorsiflexion 5 5  Ankle plantarflexion    Ankle inversion    Ankle eversion     (Blank rows = not tested)  LOWER EXTREMITY SPECIAL TESTS:    FUNCTIONAL TESTS:    GAIT: Distance walked:  Assistive device utilized:  Level of assistance:  Comments:    TODAY'S TREATMENT:                                                                                                                              DATE:  11/26/22: TherEx:  Nustep: 7 minutes, Level 4 LE only Calf stretch: x 3 holding 30 sec Hip hiking: x 10 Calf raises c UE support x 20 Hip abduction x 15 each LE c UE support Piriformis stretch:  x 2 each LE  holding 20 sec Gluteal stretch x 2 each LE holding 20 sec Bridges c clams shells Level 2 band: x 10 holding 5 sec Manual IASTM to Rt gluteals and Rt IT band   Eval Objective measures/appropriate education  TherEX  Nustep L4x6 minutes BLEs only  Bridges with red TB x5 Sidelying hip ABD x5 B red TB  Figure 4 stretch 1x30 seconds B  Hip hikes 1x5 B     PATIENT EDUCATION:  Education details: HEP, POC, exam findings  Person educated: Patient Education method: Explanation, Demonstration, and Handouts Education comprehension: verbalized understanding and returned demonstration  HOME EXERCISE PROGRAM:  Access Code: [email or web address] URL: [email or web address] Date: 11/20/2022 Prepared by: Deniece Ree  Exercises - Supine Bridge with Resistance Band  - 2 x daily - 7 x weekly - 1 sets - 10 reps - 2 hold - Sidelying Hip Abduction with Resistance at Thighs  - 2 x daily - 7 x weekly - 1 sets - 10 reps - 1 hold - Supine Figure 4 Piriformis Stretch  - 2 x daily - 7 x weekly - 1 sets - 3 reps - 30 hold - Standing Hip Hiking  - 1 x daily - 7 x weekly - 3 sets - 10 reps  ASSESSMENT:  CLINICAL IMPRESSION: Pt arriving reporting 2-3/10 in her RT hip today. Pt tolerating exercises well. Pt's HEP was reviewed with pt able to demonstrate compliance. Pt reporting good response to Manual therapy and STM. Continue skilled PT to maximize pt's function.   OBJECTIVE IMPAIRMENTS: decreased mobility, difficulty walking, decreased ROM, decreased strength, hypomobility, increased fascial restrictions, increased muscle spasms, impaired flexibility, and pain.   ACTIVITY LIMITATIONS: sitting, standing, squatting, stairs, transfers, and locomotion level  PARTICIPATION LIMITATIONS: driving, shopping, community activity, and yard work  PERSONAL FACTORS: Age, Behavior pattern, Fitness, Past/current experiences, and Time since onset of injury/illness/exacerbation are also affecting patient's  functional outcome.   REHAB POTENTIAL: Good  CLINICAL DECISION MAKING: Stable/uncomplicated  EVALUATION COMPLEXITY: Low   GOALS: Goals reviewed with patient? Yes  SHORT TERM GOALS: Target date: 12/11/2022   Will be compliant with appropriate progressive HEP  Baseline: Goal status: INITIAL  2.  Pain to be no more than 6/10 at worst  Baseline:  Goal status: INITIAL  3.  Muscle flexibility impairment to have improved by at least 50% all groups  Baseline:  Goal status: INITIAL  4.  Will be able to get up from the couch without increase in pain  Baseline:  Goal status: INITIAL   LONG TERM GOALS: Target date: 01/01/2023    MMT to improve by at least 1 grade in all weak groups  Baseline:  Goal status: INITIAL  2.  Pain to be no more than 3/10 at worst  Baseline:  Goal status: INITIAL  3.  Will be able to get up from the floor on a Mod(I) basis without increase in pain  Baseline:  Goal status: INITIAL  4.  Will have been able to return to regular walking program without increase in pain (goal of 3 miles) Baseline:  Goal status: INITIAL  5.  Will be compliant with appropriate progressive HEP vs gym program to maintain functional gains/prevent recurrence of pain  Baseline:  Goal status: INITIAL    PLAN:  PT FREQUENCY: 2x/week  PT DURATION: 6 weeks  PLANNED INTERVENTIONS: Therapeutic exercises, Therapeutic activity, Neuromuscular re-education, Balance training, Gait training, Patient/Family education, Self Care, Joint mobilization, Stair training, Aquatic Therapy, Dry Needling, Electrical stimulation, Cryotherapy, Moist  heat, Taping, Ultrasound, Ionotophoresis '4mg'$ /ml Dexamethasone, Manual therapy, and Re-evaluation  PLAN FOR NEXT SESSION: strength and flexibility, hip ROM   Kearney Hard, PT, MPT 11/26/22 2:36 PM

## 2022-11-28 ENCOUNTER — Encounter: Payer: Medicare Other | Admitting: Physical Therapy

## 2022-11-28 NOTE — Therapy (Incomplete)
OUTPATIENT PHYSICAL THERAPY LOWER EXTREMITY TREATMENT NOTE  Patient Name: Tracy Pena MRN: [id number] DOB:02-Jun-1951, 72 y.o., female Today's Date: 11/28/2022  END OF SESSION:     Past Medical History:  Diagnosis Date   Cataract    "small"   Chronic kidney disease    CKD   Complication of anesthesia    nausea after anesthesia   Decreased hearing    H/O cholesteatoma    [facility]   Hx of varicella    as a child   Hyperlipidemia    Osteopenia    3 YEARS AGO UPDATED 01/01/22   PONV (postoperative nausea and vomiting)    Past Surgical History:  Procedure Laterality Date   BREAST REDUCTION SURGERY Bilateral 12/06/2020   Procedure: MAMMARY REDUCTION  (BREAST);  Surgeon: Wallace Going, DO;  Location: [place];  Service: Plastics;  Laterality: Bilateral;  2.5 hours, please   COLONOSCOPY     INNER EAR SURGERY     at [facility] for Cholesteatoma artificial ear drum,2 ear surgeries updated 01/01/22   REPEAT CESAREAN SECTION     had 3   Patient Active Problem List   Diagnosis Date Noted   CKD (chronic kidney disease) stage 3, GFR 30-59 ml/min ([place]) 11/21/2021   S/P bilateral breast reduction 12/20/2020   Back pain 06/15/2019   Neck pain 06/15/2019   Symptomatic mammary hypertrophy 06/15/2019   Vitamin D deficiency 05/25/2019   Osteoporosis 06/02/2016   Hyperlipidemia 11/14/2014   Anxiety state, unspecified 09/20/2013   Medication management 04/09/2013   Elevated blood pressure reading 09/18/2012   H/O cholesteatoma    Visit for preventive health examination 09/22/2011   HYPERLIPIDEMIA 05/01/2010   VAGINITIS, ATROPHIC, POSTMENOPAUSAL 02/28/2009   URINALYSIS, ABNORMAL 02/28/2009   ADVERSE REACTION TO MEDICATION 02/28/2009   DECREASED HEARING 07/01/2008   HAND PAIN, BILATERAL 07/01/2008   Insomnia 07/01/2008    PCP: Lelon Frohlich MD   REFERRING PROVIDER: Leandrew Koyanagi, MD  REFERRING DIAG: M25.551 (ICD-10-CM) - Pain in right  hip  THERAPY DIAG:  No diagnosis found.  Rationale for Evaluation and Treatment: Rehabilitation  ONSET DATE: 11/06/2022  SUBJECTIVE:   SUBJECTIVE STATEMENT: *** Pt still reporting pain with lying on her side at night.   PERTINENT HISTORY: Assessment & Plan: Visit Diagnoses:  1. Pain in right hip       Plan: Impression is right trochanteric hip pain.  Possible etiologies explained.  Over-the-counter NSAIDs have not been effective so far.  She would like to try physical therapy and iontophoresis.  Referral has been placed.  Will see her back as needed.   Follow-Up Instructions: No follow-ups on file.    PAIN:  Are you having pain? 3/10 pain  Pain location: lateral R hip  Pain description: toothache Aggravating factors: getting up from floor or couch, sitting down for any length of time   Relieving factors: volatarin, heat, movement   PRECAUTIONS: Other: HOH   WEIGHT BEARING RESTRICTIONS: No  FALLS:  Has patient fallen in last 6 months? Yes. Number of falls 1; FOF (-)  LIVING ENVIRONMENT: Lives with: lives with their spouse Lives in: House/apartment Stairs: 12 STE from basement/garage with R ascending rail Has following equipment at home: None  OCCUPATION: retired   PLOF: Independent, Independent with basic ADLs, Independent with gait, and Independent with transfers  PATIENT GOALS: decrease pain, be able to walk 3 miles a day   NEXT MD VISIT: Dr. Erlinda Hong PRN   OBJECTIVE:   DIAGNOSTIC FINDINGS: Degenerative  changes to the lumbar spine without acute abnormalities.  Hip  joints are well-preserved.  PATIENT SURVEYS:  11/26/21: FOTO 77 predicted 5  COGNITION: Overall cognitive status: Within functional limits for tasks assessed     SENSATION: Not tested   MUSCLE LENGTH:  Hip flexor WNL L, mild limitation R  Quad Mild limitation B HS mild limitation B Piriformis moderate limitation R   POSTURE:   PALPATION:  No areas TTP R HS, glute/piriformis/lumbar  paraspinals, TFL  LOWER EXTREMITY ROM:  Active ROM Right eval Left eval  Lumbar flexion  --- Eval mild limitation  Lumbar extension  --- Eval WNL   Lumbar lateral flexion  WNL  WNL   Lumbar rotation  WNL  WNL    (Blank rows = not tested)  LOWER EXTREMITY MMT:  MMT Right eval Left eval  Hip flexion 3 pain limited  4-  Hip extension 3- 3  Hip abduction 4 4  Hip adduction    Hip internal rotation    Hip external rotation    Knee flexion 5 5  Knee extension 5 5  Ankle dorsiflexion 5 5  Ankle plantarflexion    Ankle inversion    Ankle eversion     (Blank rows = not tested)  LOWER EXTREMITY SPECIAL TESTS:    FUNCTIONAL TESTS:    GAIT: Distance walked:  Assistive device utilized:  Level of assistance:  Comments:    TODAY'S TREATMENT:                                                                                                                              DATE:  11/28/22 ***  11/26/22: TherEx:  Nustep: 7 minutes, Level 4 LE only Calf stretch: x 3 holding 30 sec Hip hiking: x 10 Calf raises c UE support x 20 Hip abduction x 15 each LE c UE support Piriformis stretch: x 2 each LE holding 20 sec Gluteal stretch x 2 each LE holding 20 sec Bridges c clams shells Level 2 band: x 10 holding 5 sec Manual IASTM to Rt gluteals and Rt IT band   Eval Objective measures/appropriate education  TherEX  Nustep L4x6 minutes BLEs only  Bridges with red TB x5 Sidelying hip ABD x5 B red TB  Figure 4 stretch 1x30 seconds B  Hip hikes 1x5 B     PATIENT EDUCATION:  Education details: HEP, POC, exam findings  Person educated: Patient Education method: Explanation, Demonstration, and Handouts Education comprehension: verbalized understanding and returned demonstration  HOME EXERCISE PROGRAM:  Access Code: [email or web address] URL: [email or web address] Date: 11/20/2022 Prepared by: Deniece Ree  Exercises - Supine Bridge with Resistance Band  - 2 x daily -  7 x weekly - 1 sets - 10 reps - 2 hold - Sidelying Hip Abduction with Resistance at Thighs  - 2 x daily - 7 x weekly - 1 sets - 10 reps - 1 hold - Supine Figure 4 Piriformis Stretch  - 2 x  daily - 7 x weekly - 1 sets - 3 reps - 30 hold - Standing Hip Hiking  - 1 x daily - 7 x weekly - 3 sets - 10 reps  ASSESSMENT:  CLINICAL IMPRESSION: *** Pt arriving reporting 2-3/10 in her RT hip today. Pt tolerating exercises well. Pt's HEP was reviewed with pt able to demonstrate compliance. Pt reporting good response to Manual therapy and STM. Continue skilled PT to maximize pt's function.   OBJECTIVE IMPAIRMENTS: decreased mobility, difficulty walking, decreased ROM, decreased strength, hypomobility, increased fascial restrictions, increased muscle spasms, impaired flexibility, and pain.   ACTIVITY LIMITATIONS: sitting, standing, squatting, stairs, transfers, and locomotion level  PARTICIPATION LIMITATIONS: driving, shopping, community activity, and yard work  PERSONAL FACTORS: Age, Behavior pattern, Fitness, Past/current experiences, and Time since onset of injury/illness/exacerbation are also affecting patient's functional outcome.   REHAB POTENTIAL: Good  CLINICAL DECISION MAKING: Stable/uncomplicated  EVALUATION COMPLEXITY: Low   GOALS: Goals reviewed with patient? Yes  SHORT TERM GOALS: Target date: 12/11/2022   Will be compliant with appropriate progressive HEP  Baseline: Goal status: INITIAL  2.  Pain to be no more than 6/10 at worst  Baseline:  Goal status: INITIAL  3.  Muscle flexibility impairment to have improved by at least 50% all groups  Baseline:  Goal status: INITIAL  4.  Will be able to get up from the couch without increase in pain  Baseline:  Goal status: INITIAL   LONG TERM GOALS: Target date: 01/01/2023    MMT to improve by at least 1 grade in all weak groups  Baseline:  Goal status: INITIAL  2.  Pain to be no more than 3/10 at worst  Baseline:  Goal  status: INITIAL  3.  Will be able to get up from the floor on a Mod(I) basis without increase in pain  Baseline:  Goal status: INITIAL  4.  Will have been able to return to regular walking program without increase in pain (goal of 3 miles) Baseline:  Goal status: INITIAL  5.  Will be compliant with appropriate progressive HEP vs gym program to maintain functional gains/prevent recurrence of pain  Baseline:  Goal status: INITIAL    PLAN:  PT FREQUENCY: 2x/week  PT DURATION: 6 weeks  PLANNED INTERVENTIONS: Therapeutic exercises, Therapeutic activity, Neuromuscular re-education, Balance training, Gait training, Patient/Family education, Self Care, Joint mobilization, Stair training, Aquatic Therapy, Dry Needling, Electrical stimulation, Cryotherapy, Moist heat, Taping, Ultrasound, Ionotophoresis '4mg'$ /ml Dexamethasone, Manual therapy, and Re-evaluation  PLAN FOR NEXT SESSION: *** strength and flexibility, hip ROM     Laureen Abrahams, PT, DPT 11/28/22 8:46 AM

## 2022-12-03 ENCOUNTER — Ambulatory Visit (INDEPENDENT_AMBULATORY_CARE_PROVIDER_SITE_OTHER): Payer: Medicare Other | Admitting: Physical Therapy

## 2022-12-03 ENCOUNTER — Encounter: Payer: Self-pay | Admitting: Physical Therapy

## 2022-12-03 DIAGNOSIS — M25651 Stiffness of right hip, not elsewhere classified: Secondary | ICD-10-CM

## 2022-12-03 DIAGNOSIS — R2689 Other abnormalities of gait and mobility: Secondary | ICD-10-CM

## 2022-12-03 DIAGNOSIS — M25551 Pain in right hip: Secondary | ICD-10-CM | POA: Diagnosis not present

## 2022-12-03 DIAGNOSIS — M6281 Muscle weakness (generalized): Secondary | ICD-10-CM | POA: Diagnosis not present

## 2022-12-03 NOTE — Therapy (Signed)
OUTPATIENT PHYSICAL THERAPY LOWER EXTREMITY TREATMENT NOTE  Patient Name: Tracy Pena MRN: [id number] DOB:1950/12/14, 72 y.o., female Today's Date: 12/03/2022  END OF SESSION:  PT End of Session - 12/03/22 1425     Visit Number 3    Number of Visits 13    Authorization Type MCR and Aetna    Authorization Time Period 11/20/22 to 01/15/23    Progress Note Due on Visit 10    PT Start Time 1345    PT Stop Time 1428    PT Time Calculation (min) 43 min    Activity Tolerance Patient tolerated treatment well    Behavior During Therapy WFL for tasks assessed/performed              Past Medical History:  Diagnosis Date   Cataract    "small"   Chronic kidney disease    CKD   Complication of anesthesia    nausea after anesthesia   Decreased hearing    H/O cholesteatoma    surgery [facility]   Hx of varicella    as a child   Hyperlipidemia    Osteopenia    3 YEARS AGO UPDATED 01/01/22   PONV (postoperative nausea and vomiting)    Past Surgical History:  Procedure Laterality Date   BREAST REDUCTION SURGERY Bilateral 12/06/2020   Procedure: MAMMARY REDUCTION  (BREAST);  Surgeon: Wallace Going, DO;  Location: [facility];  Service: Plastics;  Laterality: Bilateral;  2.5 hours, please   COLONOSCOPY     INNER EAR SURGERY     at [facility] for Cholesteatoma artificial ear drum,2 ear surgeries updated 01/01/22   REPEAT CESAREAN SECTION     had 3   Patient Active Problem List   Diagnosis Date Noted   CKD (chronic kidney disease) stage 3, GFR 30-59 ml/min ([place]) 11/21/2021   S/P bilateral breast reduction 12/20/2020   Back pain 06/15/2019   Neck pain 06/15/2019   Symptomatic mammary hypertrophy 06/15/2019   Vitamin D deficiency 05/25/2019   Osteoporosis 06/02/2016   Hyperlipidemia 11/14/2014   Anxiety state, unspecified 09/20/2013   Medication management 04/09/2013   Elevated blood pressure reading 09/18/2012   H/O cholesteatoma    Visit for preventive  health examination 09/22/2011   HYPERLIPIDEMIA 05/01/2010   VAGINITIS, ATROPHIC, POSTMENOPAUSAL 02/28/2009   URINALYSIS, ABNORMAL 02/28/2009   ADVERSE REACTION TO MEDICATION 02/28/2009   DECREASED HEARING 07/01/2008   HAND PAIN, BILATERAL 07/01/2008   Insomnia 07/01/2008    PCP: Lelon Frohlich MD   REFERRING PROVIDER: Leandrew Koyanagi, MD  REFERRING DIAG: [phone] (ICD-10-CM) - Pain in right hip  THERAPY DIAG:  Pain in right hip  Stiffness of right hip, not elsewhere classified  Muscle weakness (generalized)  Other abnormalities of gait and mobility  Rationale for Evaluation and Treatment: Rehabilitation  ONSET DATE: 11/06/2022  SUBJECTIVE:   SUBJECTIVE STATEMENT: Pt arriving today reporting being able to go for a walk in the park over the weekend with very mild "twinges" of pain.   PERTINENT HISTORY: Assessment & Plan: Visit Diagnoses:  1. Pain in right hip       Plan: Impression is right trochanteric hip pain.  Possible etiologies explained.  Over-the-counter NSAIDs have not been effective so far.  She would like to try physical therapy and iontophoresis.  Referral has been placed.  Will see her back as needed.   Follow-Up Instructions: No follow-ups on file.    PAIN:  Are you having pain? 1-2/10 pain  Pain location: lateral  R hip  Pain description: toothache Aggravating factors: getting up from floor or couch, sitting down for any length of time   Relieving factors: volatarin, heat, movement   PRECAUTIONS: Other: HOH   WEIGHT BEARING RESTRICTIONS: No  FALLS:  Has patient fallen in last 6 months? Yes. Number of falls 1; FOF (-)  LIVING ENVIRONMENT: Lives with: lives with their spouse Lives in: House/apartment Stairs: 12 STE from basement/garage with R ascending rail Has following equipment at home: None  OCCUPATION: retired   PLOF: Independent, Independent with basic ADLs, Independent with gait, and Independent with transfers  PATIENT GOALS:  decrease pain, be able to walk 3 miles a day   NEXT MD VISIT: Dr. Erlinda Hong PRN   OBJECTIVE:   DIAGNOSTIC FINDINGS: Degenerative changes to the lumbar spine without acute abnormalities.  Hip  joints are well-preserved.  PATIENT SURVEYS:  11/26/21: FOTO 77 predicted 86  COGNITION: Overall cognitive status: Within functional limits for tasks assessed     SENSATION: Not tested   MUSCLE LENGTH:  Hip flexor WNL L, mild limitation R  Quad Mild limitation B HS mild limitation B Piriformis moderate limitation R   POSTURE:   PALPATION:  No areas TTP R HS, glute/piriformis/lumbar paraspinals, TFL  LOWER EXTREMITY ROM:  Active ROM Right eval Left eval  Hip flexion    Hip extension    Hip abduction    Hip adduction    Hip internal rotation    Hip external rotation    Knee flexion    Knee extension    Ankle dorsiflexion    Ankle plantarflexion    Ankle inversion    Ankle eversion    Lumbar flexion  --- Eval mild limitation  Lumbar extension  --- Eval WNL   Lumbar lateral flexion  WNL  WNL   Lumbar rotation  WNL  WNL    (Blank rows = not tested)  LOWER EXTREMITY MMT:  MMT Right eval Left eval  Hip flexion 3 pain limited  4-  Hip extension 3- 3  Hip abduction 4 4  Hip adduction    Hip internal rotation    Hip external rotation    Knee flexion 5 5  Knee extension 5 5  Ankle dorsiflexion 5 5  Ankle plantarflexion    Ankle inversion    Ankle eversion     (Blank rows = not tested)  LOWER EXTREMITY SPECIAL TESTS:    FUNCTIONAL TESTS:    GAIT: Distance walked:  Assistive device utilized:  Level of assistance:  Comments:    TODAY'S TREATMENT:                                                                                                                              DATE:  12/03/22: TherEx:  Recumbent bike: level 2 x 5 minutes Calf stretch on slant board: x 1 holding 45 sec Hip hiking: x 10 each LE Hip abduction/flexion stepping over cone placed lateral x  15  Hip abduction isometrics in door frame x 10 each LE Piriformis stretch: x 2 each LE holding 20 sec Gluteal stretch x 2 each LE holding 20 sec Bridges with feet on red physio-ball x 15 holding 3-5 sec Manual IASTM to Rt gluteals and Rt IT band Modalities: Moist heat to Rt hip x 5 minutes    11/26/22: TherEx:  Nustep: 7 minutes, Level 4 LE only Calf stretch: x 3 holding 30 sec Hip hiking: x 10 Calf raises c UE support x 20 Hip abduction x 15 each LE c UE support Piriformis stretch: x 2 each LE holding 20 sec Gluteal stretch x 2 each LE holding 20 sec Bridges c clams shells Level 2 band: x 10 holding 5 sec Manual IASTM to Rt gluteals and Rt IT band   Eval Objective measures/appropriate education  TherEX  Nustep L4x6 minutes BLEs only  Bridges with red TB x5 Sidelying hip ABD x5 B red TB  Figure 4 stretch 1x30 seconds B  Hip hikes 1x5 B     PATIENT EDUCATION:  Education details: HEP, POC, exam findings  Person educated: Patient Education method: Explanation, Demonstration, and Handouts Education comprehension: verbalized understanding and returned demonstration  HOME EXERCISE PROGRAM:  Access Code: [email or web address] URL: [email or web address] Date: 11/20/2022 Prepared by: Deniece Ree  Exercises - Supine Bridge with Resistance Band  - 2 x daily - 7 x weekly - 1 sets - 10 reps - 2 hold - Sidelying Hip Abduction with Resistance at Thighs  - 2 x daily - 7 x weekly - 1 sets - 10 reps - 1 hold - Supine Figure 4 Piriformis Stretch  - 2 x daily - 7 x weekly - 1 sets - 3 reps - 30 hold - Standing Hip Hiking  - 1 x daily - 7 x weekly - 3 sets - 10 reps  ASSESSMENT:  CLINICAL IMPRESSION: Pt arriving reporting feeling much better following her last visit. Pt tolerating exercises well focusing on hip strengthening. Manual therapy performed at the end followed by moist heat. Pt reporting no pain at end session. Continue skilled PT.   OBJECTIVE IMPAIRMENTS:  decreased mobility, difficulty walking, decreased ROM, decreased strength, hypomobility, increased fascial restrictions, increased muscle spasms, impaired flexibility, and pain.   ACTIVITY LIMITATIONS: sitting, standing, squatting, stairs, transfers, and locomotion level  PARTICIPATION LIMITATIONS: driving, shopping, community activity, and yard work  PERSONAL FACTORS: Age, Behavior pattern, Fitness, Past/current experiences, and Time since onset of injury/illness/exacerbation are also affecting patient's functional outcome.   REHAB POTENTIAL: Good  CLINICAL DECISION MAKING: Stable/uncomplicated  EVALUATION COMPLEXITY: Low   GOALS: Goals reviewed with patient? Yes  SHORT TERM GOALS: Target date: 12/11/2022   Will be compliant with appropriate progressive HEP  Baseline: Goal status: 12/03/22  2.  Pain to be no more than 6/10 at worst  Baseline:  Goal status: 12/03/22  3.  Muscle flexibility impairment to have improved by at least 50% all groups  Baseline:  Goal status: INITIAL  4.  Will be able to get up from the couch without increase in pain  Baseline:  Goal status: INITIAL   LONG TERM GOALS: Target date: 01/01/2023    MMT to improve by at least 1 grade in all weak groups  Baseline:  Goal status: INITIAL  2.  Pain to be no more than 3/10 at worst  Baseline:  Goal status: INITIAL  3.  Will be able to get up from the floor on a Mod(I) basis without increase in pain  Baseline:  Goal status: INITIAL  4.  Will have been able to return to regular walking program without increase in pain (goal of 3 miles) Baseline:  Goal status: INITIAL  5.  Will be compliant with appropriate progressive HEP vs gym program to maintain functional gains/prevent recurrence of pain  Baseline:  Goal status: INITIAL    PLAN:  PT FREQUENCY: 2x/week  PT DURATION: 6 weeks  PLANNED INTERVENTIONS: Therapeutic exercises, Therapeutic activity, Neuromuscular re-education, Balance training,  Gait training, Patient/Family education, Self Care, Joint mobilization, Stair training, Aquatic Therapy, Dry Needling, Electrical stimulation, Cryotherapy, Moist heat, Taping, Ultrasound, Ionotophoresis '4mg'$ /ml Dexamethasone, Manual therapy, and Re-evaluation  PLAN FOR NEXT SESSION: strength and flexibility, hip ROM, manual as needed     Kearney Hard, PT, MPT 12/03/22 2:29 PM

## 2022-12-05 ENCOUNTER — Ambulatory Visit (INDEPENDENT_AMBULATORY_CARE_PROVIDER_SITE_OTHER): Payer: Medicare Other | Admitting: Physical Therapy

## 2022-12-05 ENCOUNTER — Encounter: Payer: Self-pay | Admitting: Physical Therapy

## 2022-12-05 DIAGNOSIS — R2689 Other abnormalities of gait and mobility: Secondary | ICD-10-CM

## 2022-12-05 DIAGNOSIS — M25551 Pain in right hip: Secondary | ICD-10-CM

## 2022-12-05 DIAGNOSIS — M6281 Muscle weakness (generalized): Secondary | ICD-10-CM

## 2022-12-05 DIAGNOSIS — M25651 Stiffness of right hip, not elsewhere classified: Secondary | ICD-10-CM | POA: Diagnosis not present

## 2022-12-05 NOTE — Therapy (Signed)
OUTPATIENT PHYSICAL THERAPY LOWER EXTREMITY TREATMENT NOTE  Patient Name: Tracy Pena MRN: [id number] DOB:January 13, 1951, 72 y.o., female Today's Date: 12/05/2022  END OF SESSION:  PT End of Session - 12/05/22 1346     Visit Number 4    Number of Visits 13    Date for PT Re-Evaluation 01/01/23    Authorization Type MCR and Aetna    Authorization Time Period 11/20/22 to 01/15/23    Progress Note Due on Visit 10    PT Start Time 1345    PT Stop Time 1426    PT Time Calculation (min) 41 min    Activity Tolerance Patient tolerated treatment well    Behavior During Therapy [facility] for tasks assessed/performed               Past Medical History:  Diagnosis Date   Cataract    "small"   Chronic kidney disease    CKD   Complication of anesthesia    nausea after anesthesia   Decreased hearing    H/O cholesteatoma    [facility]   Hx of varicella    as a child   Hyperlipidemia    Osteopenia    3 YEARS AGO UPDATED 01/01/22   PONV (postoperative nausea and vomiting)    Past Surgical History:  Procedure Laterality Date   BREAST REDUCTION SURGERY Bilateral 12/06/2020   Procedure: MAMMARY REDUCTION  (BREAST);  Surgeon: Wallace Going, DO;  Location: [facility];  Service: Plastics;  Laterality: Bilateral;  2.5 hours, please   COLONOSCOPY     INNER EAR SURGERY     at [facility] for Cholesteatoma artificial ear drum,2 ear surgeries updated 01/01/22   REPEAT CESAREAN SECTION     had 3   Patient Active Problem List   Diagnosis Date Noted   CKD (chronic kidney disease) stage 3, GFR 30-59 ml/min ([place]) 11/21/2021   S/P bilateral breast reduction 12/20/2020   Back pain 06/15/2019   Neck pain 06/15/2019   Symptomatic mammary hypertrophy 06/15/2019   Vitamin D deficiency 05/25/2019   Osteoporosis 06/02/2016   Hyperlipidemia 11/14/2014   Anxiety state, unspecified 09/20/2013   Medication management 04/09/2013   Elevated blood pressure reading 09/18/2012   H/O  cholesteatoma    Visit for preventive health examination 09/22/2011   HYPERLIPIDEMIA 05/01/2010   VAGINITIS, ATROPHIC, POSTMENOPAUSAL 02/28/2009   URINALYSIS, ABNORMAL 02/28/2009   ADVERSE REACTION TO MEDICATION 02/28/2009   DECREASED HEARING 07/01/2008   HAND PAIN, BILATERAL 07/01/2008   Insomnia 07/01/2008    PCP: Lelon Frohlich MD   REFERRING PROVIDER: Leandrew Koyanagi, MD  REFERRING DIAG: [phone] (ICD-10-CM) - Pain in right hip  THERAPY DIAG:  Pain in right hip  Stiffness of right hip, not elsewhere classified  Muscle weakness (generalized)  Other abnormalities of gait and mobility  Rationale for Evaluation and Treatment: Rehabilitation  ONSET DATE: 11/06/2022  SUBJECTIVE:   SUBJECTIVE STATEMENT: Reports hip has been doing well.   PERTINENT HISTORY: Assessment & Plan: Visit Diagnoses:  1. Pain in right hip       Plan: Impression is right trochanteric hip pain.  Possible etiologies explained.  Over-the-counter NSAIDs have not been effective so far.  She would like to try physical therapy and iontophoresis.  Referral has been placed.  Will see her back as needed.   Follow-Up Instructions: No follow-ups on file.    PAIN:  Are you having pain? 0/10 pain Pain location: lateral R hip  Pain description: toothache Aggravating factors: getting  up from floor or couch, sitting down for any length of time   Relieving factors: volatarin, heat, movement   PRECAUTIONS: Other: HOH   WEIGHT BEARING RESTRICTIONS: No  FALLS:  Has patient fallen in last 6 months? Yes. Number of falls 1; FOF (-)  LIVING ENVIRONMENT: Lives with: lives with their spouse Lives in: House/apartment Stairs: 12 STE from basement/garage with R ascending rail Has following equipment at home: None  OCCUPATION: retired   PLOF: Independent, Independent with basic ADLs, Independent with gait, and Independent with transfers  PATIENT GOALS: decrease pain, be able to walk 3 miles a day    NEXT MD VISIT: Dr. Erlinda Hong PRN   OBJECTIVE:   DIAGNOSTIC FINDINGS: Degenerative changes to the lumbar spine without acute abnormalities.  Hip  joints are well-preserved.  PATIENT SURVEYS:  11/26/21: FOTO 77 predicted 10  COGNITION: Overall cognitive status: Within functional limits for tasks assessed     SENSATION: Not tested   MUSCLE LENGTH:  Hip flexor WNL L, mild limitation R  Quad Mild limitation B HS mild limitation B Piriformis moderate limitation R   POSTURE:   PALPATION:  No areas TTP R HS, glute/piriformis/lumbar paraspinals, TFL  LOWER EXTREMITY ROM:  Active ROM Right eval Left eval  Hip flexion    Hip extension    Hip abduction    Hip adduction    Hip internal rotation    Hip external rotation    Knee flexion    Knee extension    Ankle dorsiflexion    Ankle plantarflexion    Ankle inversion    Ankle eversion    Lumbar flexion  --- Eval mild limitation  Lumbar extension  --- Eval WNL   Lumbar lateral flexion  WNL  WNL   Lumbar rotation  WNL  WNL    (Blank rows = not tested)  LOWER EXTREMITY MMT:  MMT Right eval Left eval  Hip flexion 3 pain limited  4-  Hip extension 3- 3  Hip abduction 4 4  Hip adduction    Hip internal rotation    Hip external rotation    Knee flexion 5 5  Knee extension 5 5  Ankle dorsiflexion 5 5  Ankle plantarflexion    Ankle inversion    Ankle eversion     (Blank rows = not tested)  LOWER EXTREMITY SPECIAL TESTS:    FUNCTIONAL TESTS:    GAIT: Distance walked:  Assistive device utilized:  Level of assistance:  Comments:    TODAY'S TREATMENT:                                                                                                                              DATE:  12/05/22 TherEx:  Recumbent bike: level 3 x 5 minutes Supine piriformis stretch 3x30 sec bil Bridge walk out x 5 reps Hooklying single leg clam with L4 band 2x10 Sidelying clamshell 2x10 Sit to/from stand x 10  reps  Manual IASTM with  percussive device Rt hip/glute  12/03/22: TherEx:  Recumbent bike: level 2 x 5 minutes Calf stretch on slant board: x 1 holding 45 sec Hip hiking: x 10 each LE Hip abduction/flexion stepping over cone placed lateral x 15  Hip abduction isometrics in door frame x 10 each LE Piriformis stretch: x 2 each LE holding 20 sec Gluteal stretch x 2 each LE holding 20 sec Bridges with feet on red physio-ball x 15 holding 3-5 sec Manual IASTM to Rt gluteals and Rt IT band Modalities: Moist heat to Rt hip x 5 minutes    11/26/22: TherEx:  Nustep: 7 minutes, Level 4 LE only Calf stretch: x 3 holding 30 sec Hip hiking: x 10 Calf raises c UE support x 20 Hip abduction x 15 each LE c UE support Piriformis stretch: x 2 each LE holding 20 sec Gluteal stretch x 2 each LE holding 20 sec Bridges c clams shells Level 2 band: x 10 holding 5 sec Manual IASTM to Rt gluteals and Rt IT band   Eval Objective measures/appropriate education  TherEX  Nustep L4x6 minutes BLEs only  Bridges with red TB x5 Sidelying hip ABD x5 B red TB  Figure 4 stretch 1x30 seconds B  Hip hikes 1x5 B     PATIENT EDUCATION:  Education details: HEP, POC, exam findings  Person educated: Patient Education method: Explanation, Demonstration, and Handouts Education comprehension: verbalized understanding and returned demonstration  HOME EXERCISE PROGRAM: Access Code: [email or web address] URL: [email or web address] Date: 12/05/2022 Prepared by: Faustino Congress  Exercises - Supine Bridge with Resistance Band  - 2 x daily - 7 x weekly - 1 sets - 10 reps - 2 hold - Sidelying Hip Abduction with Resistance at Thighs  - 2 x daily - 7 x weekly - 1 sets - 10 reps - 1 hold - Supine Figure 4 Piriformis Stretch  - 2 x daily - 7 x weekly - 1 sets - 3 reps - 30 hold - Standing Hip Hiking  - 1 x daily - 7 x weekly - 3 sets - 10 reps - Standing Hip Abduction with Resistance at Ankles and Counter  Support  - 1 x daily - 7 x weekly - 2 sets - 10 reps - Standing Hip Extension with Resistance at Ankles and Counter Support  - 1 x daily - 7 x weekly - 2 sets - 10 reps  ASSESSMENT:  CLINICAL IMPRESSION: Pt overall doing well with PT and pain is significantly improved.  Anticipate she may be able to d/c or hold early from PT which we discussed today.  Will continue to benefit from PT to maximize function.  OBJECTIVE IMPAIRMENTS: decreased mobility, difficulty walking, decreased ROM, decreased strength, hypomobility, increased fascial restrictions, increased muscle spasms, impaired flexibility, and pain.   ACTIVITY LIMITATIONS: sitting, standing, squatting, stairs, transfers, and locomotion level  PARTICIPATION LIMITATIONS: driving, shopping, community activity, and yard work  PERSONAL FACTORS: Age, Behavior pattern, Fitness, Past/current experiences, and Time since onset of injury/illness/exacerbation are also affecting patient's functional outcome.   REHAB POTENTIAL: Good  CLINICAL DECISION MAKING: Stable/uncomplicated  EVALUATION COMPLEXITY: Low   GOALS: Goals reviewed with patient? Yes  SHORT TERM GOALS: Target date: 12/11/2022   Will be compliant with appropriate progressive HEP  Baseline: Goal status: MET 12/03/22  2.  Pain to be no more than 6/10 at worst  Baseline:  Goal status: MET 12/03/22  3.  Muscle flexibility impairment to have improved by at least 50% all groups  Baseline:  Goal status:  INITIAL  4.  Will be able to get up from the couch without increase in pain  Baseline:  Goal status: INITIAL   LONG TERM GOALS: Target date: 01/01/2023    MMT to improve by at least 1 grade in all weak groups  Baseline:  Goal status: INITIAL  2.  Pain to be no more than 3/10 at worst  Baseline:  Goal status: INITIAL  3.  Will be able to get up from the floor on a Mod(I) basis without increase in pain  Baseline:  Goal status: INITIAL  4.  Will have been able to return  to regular walking program without increase in pain (goal of 3 miles) Baseline:  Goal status: INITIAL  5.  Will be compliant with appropriate progressive HEP vs gym program to maintain functional gains/prevent recurrence of pain  Baseline:  Goal status: INITIAL    PLAN:  PT FREQUENCY: 2x/week  PT DURATION: 6 weeks  PLANNED INTERVENTIONS: Therapeutic exercises, Therapeutic activity, Neuromuscular re-education, Balance training, Gait training, Patient/Family education, Self Care, Joint mobilization, Stair training, Aquatic Therapy, Dry Needling, Electrical stimulation, Cryotherapy, Moist heat, Taping, Ultrasound, Ionotophoresis '4mg'$ /ml Dexamethasone, Manual therapy, and Re-evaluation  PLAN FOR NEXT SESSION: possibly hold PT if still doing well,  strength and flexibility, hip ROM, manual as needed     Laureen Abrahams, PT, DPT 12/05/22 2:35 PM

## 2022-12-10 ENCOUNTER — Ambulatory Visit (INDEPENDENT_AMBULATORY_CARE_PROVIDER_SITE_OTHER): Payer: Medicare Other | Admitting: Physical Therapy

## 2022-12-10 ENCOUNTER — Encounter: Payer: Self-pay | Admitting: Physical Therapy

## 2022-12-10 DIAGNOSIS — M6281 Muscle weakness (generalized): Secondary | ICD-10-CM

## 2022-12-10 DIAGNOSIS — M25551 Pain in right hip: Secondary | ICD-10-CM

## 2022-12-10 DIAGNOSIS — R2689 Other abnormalities of gait and mobility: Secondary | ICD-10-CM | POA: Diagnosis not present

## 2022-12-10 DIAGNOSIS — M25651 Stiffness of right hip, not elsewhere classified: Secondary | ICD-10-CM

## 2022-12-10 NOTE — Therapy (Signed)
OUTPATIENT PHYSICAL THERAPY LOWER EXTREMITY TREATMENT NOTE  Patient Name: Tracy Pena MRN: [id number] DOB:December 19, 1950, 72 y.o., female Today's Date: 12/10/2022  END OF SESSION:  PT End of Session - 12/10/22 1511     Visit Number 5    Number of Visits 13    Date for PT Re-Evaluation 01/01/23    Authorization Type MCR and Aetna    Authorization Time Period 11/20/22 to 01/15/23    Progress Note Due on Visit 10    PT Start Time 1345    PT Stop Time 1426    PT Time Calculation (min) 41 min    Activity Tolerance Patient tolerated treatment well    Behavior During Therapy [facility] for tasks assessed/performed                Past Medical History:  Diagnosis Date   Cataract    "small"   Chronic kidney disease    CKD   Complication of anesthesia    nausea after anesthesia   Decreased hearing    H/O cholesteatoma    [facility]   Hx of varicella    as a child   Hyperlipidemia    Osteopenia    3 YEARS AGO UPDATED 01/01/22   PONV (postoperative nausea and vomiting)    Past Surgical History:  Procedure Laterality Date   BREAST REDUCTION SURGERY Bilateral 12/06/2020   Procedure: MAMMARY REDUCTION  (BREAST);  Surgeon: Wallace Going, DO;  Location: [place];  Service: Plastics;  Laterality: Bilateral;  2.5 hours, please   COLONOSCOPY     INNER EAR SURGERY     at [facility] for Cholesteatoma artificial ear drum,2 ear surgeries updated 01/01/22   REPEAT CESAREAN SECTION     had 3   Patient Active Problem List   Diagnosis Date Noted   CKD (chronic kidney disease) stage 3, GFR 30-59 ml/min ([place]) 11/21/2021   S/P bilateral breast reduction 12/20/2020   Back pain 06/15/2019   Neck pain 06/15/2019   Symptomatic mammary hypertrophy 06/15/2019   Vitamin D deficiency 05/25/2019   Osteoporosis 06/02/2016   Hyperlipidemia 11/14/2014   Anxiety state, unspecified 09/20/2013   Medication management 04/09/2013   Elevated blood pressure reading 09/18/2012    H/O cholesteatoma    Visit for preventive health examination 09/22/2011   HYPERLIPIDEMIA 05/01/2010   VAGINITIS, ATROPHIC, POSTMENOPAUSAL 02/28/2009   URINALYSIS, ABNORMAL 02/28/2009   ADVERSE REACTION TO MEDICATION 02/28/2009   DECREASED HEARING 07/01/2008   HAND PAIN, BILATERAL 07/01/2008   Insomnia 07/01/2008    PCP: Lelon Frohlich MD   REFERRING PROVIDER: Leandrew Koyanagi, MD  REFERRING DIAG: [id number] (ICD-10-CM) - Pain in right hip  THERAPY DIAG:  Pain in right hip  Muscle weakness (generalized)  Stiffness of right hip, not elsewhere classified  Other abnormalities of gait and mobility  Rationale for Evaluation and Treatment: Rehabilitation  ONSET DATE: 11/06/2022  SUBJECTIVE:   SUBJECTIVE STATEMENT: Pt arriving to therapy reporting her hip feels great. Pt did however report she turn while lifting her laundry basket and pulled her low back.    PERTINENT HISTORY: Assessment & Plan: Visit Diagnoses:  1. Pain in right hip       Plan: Impression is right trochanteric hip pain.  Possible etiologies explained.  Over-the-counter NSAIDs have not been effective so far.  She would like to try physical therapy and iontophoresis.  Referral has been placed.  Will see her back as needed.   Follow-Up Instructions: No follow-ups on file.  PAIN:  Are you having pain? 0/10 pain Pain location: lateral R hip  Pain description: toothache Aggravating factors: getting up from floor or couch, sitting down for any length of time   Relieving factors: volatarin, heat, movement   PRECAUTIONS: Other: HOH   WEIGHT BEARING RESTRICTIONS: No  FALLS:  Has patient fallen in last 6 months? Yes. Number of falls 1; FOF (-)  LIVING ENVIRONMENT: Lives with: lives with their spouse Lives in: House/apartment Stairs: 12 STE from basement/garage with R ascending rail Has following equipment at home: None  OCCUPATION: retired   PLOF: Independent, Independent with basic ADLs,  Independent with gait, and Independent with transfers  PATIENT GOALS: decrease pain, be able to walk 3 miles a day   NEXT MD VISIT: Dr. Erlinda Hong PRN   OBJECTIVE:   DIAGNOSTIC FINDINGS: Degenerative changes to the lumbar spine without acute abnormalities.  Hip  joints are well-preserved.  PATIENT SURVEYS:  11/26/21: FOTO 77 predicted 46  COGNITION: Overall cognitive status: Within functional limits for tasks assessed     SENSATION: Not tested   MUSCLE LENGTH:  Hip flexor WNL L, mild limitation R  Quad Mild limitation B HS mild limitation B Piriformis moderate limitation R   POSTURE:   PALPATION:  No areas TTP R HS, glute/piriformis/lumbar paraspinals, TFL  LOWER EXTREMITY ROM:  Active ROM Right eval Left eval  Hip flexion    Hip extension    Hip abduction    Hip adduction    Hip internal rotation    Hip external rotation    Knee flexion    Knee extension    Ankle dorsiflexion    Ankle plantarflexion    Ankle inversion    Ankle eversion    Lumbar flexion  --- Eval mild limitation  Lumbar extension  --- Eval WNL   Lumbar lateral flexion  WNL  WNL   Lumbar rotation  WNL  WNL    (Blank rows = not tested)  LOWER EXTREMITY MMT:  MMT Right eval Left eval  Hip flexion 3 pain limited  4-  Hip extension 3- 3  Hip abduction 4 4  Hip adduction    Hip internal rotation    Hip external rotation    Knee flexion 5 5  Knee extension 5 5  Ankle dorsiflexion 5 5  Ankle plantarflexion    Ankle inversion    Ankle eversion     (Blank rows = not tested)  LOWER EXTREMITY SPECIAL TESTS:    FUNCTIONAL TESTS:    GAIT: Distance walked:  Assistive device utilized:  Level of assistance:  Comments:    TODAY'S TREATMENT:                                                                                                                              DATE:  12/10/22 TherEx:  Nustep: Level 5 x 6 minutes Trunk rotation: x 5 to each side holding 20 sec Bridge: 2 x 10 clam  shell Level 3 band Supine piriformis stretch 3x30 sec bil Supine marching while in PPT 3 x 10 Supine SKTC: x 3 each LE holding 20 sec Manual: STM to lumbar paraspinals   12/05/22 TherEx:  Recumbent bike: level 3 x 5 minutes Supine piriformis stretch 3x30 sec bil Bridge walk out x 5 reps Hooklying single leg clam with L4 band 2x10 Sidelying clamshell 2x10 Sit to/from stand x 10 reps  Manual IASTM with percussive device Rt hip/glute  12/03/22: TherEx:  Recumbent bike: level 2 x 5 minutes Calf stretch on slant board: x 1 holding 45 sec Hip hiking: x 10 each LE Hip abduction/flexion stepping over cone placed lateral x 15  Hip abduction isometrics in door frame x 10 each LE Piriformis stretch: x 2 each LE holding 20 sec Gluteal stretch x 2 each LE holding 20 sec Bridges with feet on red physio-ball x 15 holding 3-5 sec Manual IASTM to Rt gluteals and Rt IT band Modalities: Moist heat to Rt hip x 5 minutes    11/26/22: TherEx:  Nustep: 7 minutes, Level 4 LE only Calf stretch: x 3 holding 30 sec Hip hiking: x 10 Calf raises c UE support x 20 Hip abduction x 15 each LE c UE support Piriformis stretch: x 2 each LE holding 20 sec Gluteal stretch x 2 each LE holding 20 sec Bridges c clams shells Level 2 band: x 10 holding 5 sec Manual IASTM to Rt gluteals and Rt IT band   Eval Objective measures/appropriate education  TherEX  Nustep L4x6 minutes BLEs only  Bridges with red TB x5 Sidelying hip ABD x5 B red TB  Figure 4 stretch 1x30 seconds B  Hip hikes 1x5 B     PATIENT EDUCATION:  Education details: HEP, POC, exam findings  Person educated: Patient Education method: Explanation, Demonstration, and Handouts Education comprehension: verbalized understanding and returned demonstration  HOME EXERCISE PROGRAM: Access Code: [email or web address] URL: [email or web address] Date: 12/05/2022 Prepared by: Faustino Congress  Exercises - Supine Bridge with  Resistance Band  - 2 x daily - 7 x weekly - 1 sets - 10 reps - 2 hold - Sidelying Hip Abduction with Resistance at Thighs  - 2 x daily - 7 x weekly - 1 sets - 10 reps - 1 hold - Supine Figure 4 Piriformis Stretch  - 2 x daily - 7 x weekly - 1 sets - 3 reps - 30 hold - Standing Hip Hiking  - 1 x daily - 7 x weekly - 3 sets - 10 reps - Standing Hip Abduction with Resistance at Ankles and Counter Support  - 1 x daily - 7 x weekly - 2 sets - 10 reps - Standing Hip Extension with Resistance at Ankles and Counter Support  - 1 x daily - 7 x weekly - 2 sets - 10 reps  ASSESSMENT:  CLINICAL IMPRESSION: Pt arriving to therapy reporting more back pain than hip pain today. Pt tolerating both back and hip exercises along with manual therapy to pt's low back. Recommended continue skilled PT to maximize pt's function.   OBJECTIVE IMPAIRMENTS: decreased mobility, difficulty walking, decreased ROM, decreased strength, hypomobility, increased fascial restrictions, increased muscle spasms, impaired flexibility, and pain.   ACTIVITY LIMITATIONS: sitting, standing, squatting, stairs, transfers, and locomotion level  PARTICIPATION LIMITATIONS: driving, shopping, community activity, and yard work  PERSONAL FACTORS: Age, Behavior pattern, Fitness, Past/current experiences, and Time since onset of injury/illness/exacerbation are also affecting patient's functional outcome.   REHAB POTENTIAL: Good  CLINICAL DECISION  MAKING: Stable/uncomplicated  EVALUATION COMPLEXITY: Low   GOALS: Goals reviewed with patient? Yes  SHORT TERM GOALS: Target date: 12/11/2022   Will be compliant with appropriate progressive HEP  Baseline: Goal status: MET 12/03/22  2.  Pain to be no more than 6/10 at worst  Baseline:  Goal status: MET 12/03/22  3.  Muscle flexibility impairment to have improved by at least 50% all groups  Baseline:  Goal status: INITIAL  4.  Will be able to get up from the couch without increase in pain   Baseline:  Goal status: INITIAL   LONG TERM GOALS: Target date: 01/01/2023    MMT to improve by at least 1 grade in all weak groups  Baseline:  Goal status: INITIAL  2.  Pain to be no more than 3/10 at worst  Baseline:  Goal status: INITIAL  3.  Will be able to get up from the floor on a Mod(I) basis without increase in pain  Baseline:  Goal status: INITIAL  4.  Will have been able to return to regular walking program without increase in pain (goal of 3 miles) Baseline:  Goal status: INITIAL  5.  Will be compliant with appropriate progressive HEP vs gym program to maintain functional gains/prevent recurrence of pain  Baseline:  Goal status: INITIAL    PLAN:  PT FREQUENCY: 2x/week  PT DURATION: 6 weeks  PLANNED INTERVENTIONS: Therapeutic exercises, Therapeutic activity, Neuromuscular re-education, Balance training, Gait training, Patient/Family education, Self Care, Joint mobilization, Stair training, Aquatic Therapy, Dry Needling, Electrical stimulation, Cryotherapy, Moist heat, Taping, Ultrasound, Ionotophoresis 57m/ml Dexamethasone, Manual therapy, and Re-evaluation  PLAN FOR NEXT SESSION: strength and flexibility, hip ROM, manual as needed     JKearney Hard PT, MPT 12/10/22 3:15 PM   12/10/22 3:15 PM

## 2022-12-12 ENCOUNTER — Encounter: Payer: Medicare Other | Admitting: Physical Therapy

## 2022-12-17 ENCOUNTER — Encounter: Payer: Self-pay | Admitting: Physical Therapy

## 2022-12-17 ENCOUNTER — Ambulatory Visit (INDEPENDENT_AMBULATORY_CARE_PROVIDER_SITE_OTHER): Payer: Medicare Other | Admitting: Physical Therapy

## 2022-12-17 DIAGNOSIS — M25651 Stiffness of right hip, not elsewhere classified: Secondary | ICD-10-CM

## 2022-12-17 DIAGNOSIS — R2689 Other abnormalities of gait and mobility: Secondary | ICD-10-CM | POA: Diagnosis not present

## 2022-12-17 DIAGNOSIS — M6281 Muscle weakness (generalized): Secondary | ICD-10-CM

## 2022-12-17 DIAGNOSIS — M25551 Pain in right hip: Secondary | ICD-10-CM | POA: Diagnosis not present

## 2022-12-17 NOTE — Therapy (Signed)
OUTPATIENT PHYSICAL THERAPY LOWER EXTREMITY DISCHARGE SUMMARY  Patient Name: Tracy Pena MRN: [id number] DOB:September 28, 1951, 72 y.o., female Today's Date: 12/17/2022  END OF SESSION:  PT End of Session - 12/17/22 1352     Visit Number 6    Number of Visits 13    Date for PT Re-Evaluation 01/01/23    Authorization Type MCR and Aetna    Authorization Time Period 11/20/22 to 01/15/23    Progress Note Due on Visit 10    PT Start Time [id number]    PT Stop Time 1416    PT Time Calculation (min) 28 min    Activity Tolerance Patient tolerated treatment well    Behavior During Therapy [facility] for tasks assessed/performed                Past Medical History:  Diagnosis Date   Cataract    "small"   Chronic kidney disease    CKD   Complication of anesthesia    nausea after anesthesia   Decreased hearing    H/O cholesteatoma    [facility]   Hx of varicella    as a child   Hyperlipidemia    Osteopenia    3 YEARS AGO UPDATED 01/01/22   PONV (postoperative nausea and vomiting)    Past Surgical History:  Procedure Laterality Date   BREAST REDUCTION SURGERY Bilateral 12/06/2020   Procedure: MAMMARY REDUCTION  (BREAST);  Surgeon: Wallace Going, DO;  Location: [place];  Service: Plastics;  Laterality: Bilateral;  2.5 hours, please   COLONOSCOPY     INNER EAR SURGERY     at [facility] for Cholesteatoma artificial ear drum,2 ear surgeries updated 01/01/22   REPEAT CESAREAN SECTION     had 3   Patient Active Problem List   Diagnosis Date Noted   CKD (chronic kidney disease) stage 3, GFR 30-59 ml/min ([place]) 11/21/2021   S/P bilateral breast reduction 12/20/2020   Back pain 06/15/2019   Neck pain 06/15/2019   Symptomatic mammary hypertrophy 06/15/2019   Vitamin D deficiency 05/25/2019   Osteoporosis 06/02/2016   Hyperlipidemia 11/14/2014   Anxiety state, unspecified 09/20/2013   Medication management 04/09/2013   Elevated blood pressure reading 09/18/2012    H/O cholesteatoma    Visit for preventive health examination 09/22/2011   HYPERLIPIDEMIA 05/01/2010   VAGINITIS, ATROPHIC, POSTMENOPAUSAL 02/28/2009   URINALYSIS, ABNORMAL 02/28/2009   ADVERSE REACTION TO MEDICATION 02/28/2009   DECREASED HEARING 07/01/2008   HAND PAIN, BILATERAL 07/01/2008   Insomnia 07/01/2008    PCP: Lelon Frohlich MD   REFERRING PROVIDER: Leandrew Koyanagi, MD  REFERRING DIAG: [phone] (ICD-10-CM) - Pain in right hip  THERAPY DIAG:  Pain in right hip  Muscle weakness (generalized)  Stiffness of right hip, not elsewhere classified  Other abnormalities of gait and mobility  Rationale for Evaluation and Treatment: Rehabilitation  ONSET DATE: 11/06/2022  SUBJECTIVE:   SUBJECTIVE STATEMENT: Pt arriving today reporting no pain in her hip.    PERTINENT HISTORY: Assessment & Plan: Visit Diagnoses:  1. Pain in right hip       Plan: Impression is right trochanteric hip pain.  Possible etiologies explained.  Over-the-counter NSAIDs have not been effective so far.  She would like to try physical therapy and iontophoresis.  Referral has been placed.  Will see her back as needed.   Follow-Up Instructions: No follow-ups on file.    PAIN:  Are you having pain? 0/10 pain Pain location: lateral R hip  Pain  description: toothache Aggravating factors: getting up from floor or couch, sitting down for any length of time   Relieving factors: volatarin, heat, movement   PRECAUTIONS: Other: HOH   WEIGHT BEARING RESTRICTIONS: No  FALLS:  Has patient fallen in last 6 months? Yes. Number of falls 1; FOF (-)  LIVING ENVIRONMENT: Lives with: lives with their spouse Lives in: House/apartment Stairs: 12 STE from basement/garage with R ascending rail Has following equipment at home: None  OCCUPATION: retired   PLOF: Independent, Independent with basic ADLs, Independent with gait, and Independent with transfers  PATIENT GOALS: decrease pain, be able to  walk 3 miles a day   NEXT MD VISIT: Dr. Erlinda Hong PRN   OBJECTIVE:   DIAGNOSTIC FINDINGS: Degenerative changes to the lumbar spine without acute abnormalities.  Hip  joints are well-preserved.  PATIENT SURVEYS:  11/26/21: FOTO 77 predicted 82 12/17/22: FOTO 98.8%  COGNITION: Overall cognitive status: Within functional limits for tasks assessed     SENSATION: Not tested   MUSCLE LENGTH:  Hip flexor WNL L, mild limitation R  Quad Mild limitation B HS mild limitation B Piriformis moderate limitation R   POSTURE:   PALPATION:  No areas TTP R HS, glute/piriformis/lumbar paraspinals, TFL  LOWER EXTREMITY ROM:  Active ROM Right eval Left eval  12/17/22  Hip flexion   Rt/left: 130  Hip extension     Hip abduction     Hip adduction     Hip internal rotation     Hip external rotation     Knee flexion     Knee extension     Ankle dorsiflexion     Ankle plantarflexion     Ankle inversion     Ankle eversion     Lumbar flexion  --- Eval mild limitation 70  Lumbar extension  --- Eval WNL  30  Lumbar lateral flexion  WNL  WNL  25/28  Lumbar rotation  WNL  WNL  WFL   (Blank rows = not tested)    LOWER EXTREMITY MMT:  MMT Right eval Left eval Rt 12/17/22 Left 12/17/22  Hip flexion 3 pain limited  4- 4 5  Hip extension 3- 3 5 5  $ Hip abduction 4 4 5 5  $ Hip adduction      Hip internal rotation      Hip external rotation      Knee flexion 5 5 5 5  $ Knee extension 5 5 5 5  $ Ankle dorsiflexion 5 5 5 5  $ Ankle plantarflexion      Ankle inversion      Ankle eversion       (Blank rows = not tested)  LOWER EXTREMITY SPECIAL TESTS:    FUNCTIONAL TESTS:    GAIT: Distance walked:  Assistive device utilized:  Level of assistance:  Comments:    TODAY'S TREATMENT:  DATE:  12/17/22 TherEx:  Nustep: Level 5 x 6 minutes Lateral step ups x 15 each LE  c UE support Hip hiking x 15 each LE off 6 inch step Side stepping c Level 2 band 6 feet x 5 each direction Trunk rotation: x 5 to each side holding 20 sec Bridge: feet on red physioball Supine piriformis stretch 3 x 20 sec bil Supine SKTC: x 3 each LE holding 20 sec     12/10/22 TherEx:  Nustep: Level 5 x 6 minutes Trunk rotation: x 5 to each side holding 20 sec Bridge: 2 x 10 clam shell Level 3 band Supine piriformis stretch 3x30 sec bil Supine marching while in PPT 3 x 10 Supine SKTC: x 3 each LE holding 20 sec Manual: STM to lumbar paraspinals   12/05/22 TherEx:  Recumbent bike: level 3 x 5 minutes Supine piriformis stretch 3x30 sec bil Bridge walk out x 5 reps Hooklying single leg clam with L4 band 2x10 Sidelying clamshell 2x10 Sit to/from stand x 10 reps  Manual IASTM with percussive device Rt hip/glute  12/03/22: TherEx:  Recumbent bike: level 2 x 5 minutes Calf stretch on slant board: x 1 holding 45 sec Hip hiking: x 10 each LE Hip abduction/flexion stepping over cone placed lateral x 15  Hip abduction isometrics in door frame x 10 each LE Piriformis stretch: x 2 each LE holding 20 sec Gluteal stretch x 2 each LE holding 20 sec Bridges with feet on red physio-ball x 15 holding 3-5 sec Manual IASTM to Rt gluteals and Rt IT band Modalities: Moist heat to Rt hip x 5 minutes      Eval Objective measures/appropriate education  TherEX  Nustep L4x6 minutes BLEs only  Bridges with red TB x5 Sidelying hip ABD x5 B red TB  Figure 4 stretch 1x30 seconds B  Hip hikes 1x5 B     PATIENT EDUCATION:  Education details: HEP, POC, exam findings  Person educated: Patient Education method: Explanation, Demonstration, and Handouts Education comprehension: verbalized understanding and returned demonstration  HOME EXERCISE PROGRAM: Access Code: [email or web address] URL: [email or web address] Date: 12/05/2022 Prepared by: Faustino Congress  Exercises -  Supine Bridge with Resistance Band  - 2 x daily - 7 x weekly - 1 sets - 10 reps - 2 hold - Sidelying Hip Abduction with Resistance at Thighs  - 2 x daily - 7 x weekly - 1 sets - 10 reps - 1 hold - Supine Figure 4 Piriformis Stretch  - 2 x daily - 7 x weekly - 1 sets - 3 reps - 30 hold - Standing Hip Hiking  - 1 x daily - 7 x weekly - 3 sets - 10 reps - Standing Hip Abduction with Resistance at Ankles and Counter Support  - 1 x daily - 7 x weekly - 2 sets - 10 reps - Standing Hip Extension with Resistance at Ankles and Counter Support  - 1 x daily - 7 x weekly - 2 sets - 10 reps  ASSESSMENT:  CLINICAL IMPRESSION: Pt arriving reporting no pain for second straight week. Pt reporting compliance in her HEP and it was reviewed this visit. Pt has met all her STG's and LTG's set at her initial evaluation and is being discharged today with a FOTO of 98%.   OBJECTIVE IMPAIRMENTS: decreased mobility, difficulty walking, decreased ROM, decreased strength, hypomobility, increased fascial restrictions, increased muscle spasms, impaired flexibility, and pain.   ACTIVITY LIMITATIONS: sitting, standing, squatting, stairs, transfers, and locomotion level  PARTICIPATION  LIMITATIONS: driving, shopping, community activity, and yard work  PERSONAL FACTORS: Age, Behavior pattern, Fitness, Past/current experiences, and Time since onset of injury/illness/exacerbation are also affecting patient's functional outcome.   REHAB POTENTIAL: Good  CLINICAL DECISION MAKING: Stable/uncomplicated  EVALUATION COMPLEXITY: Low   GOALS: Goals reviewed with patient? Yes  SHORT TERM GOALS: Target date: 12/11/2022   Will be compliant with appropriate progressive HEP  Baseline: Goal status: MET 12/03/22  2.  Pain to be no more than 6/10 at worst  Baseline:  Goal status: MET 12/03/22  3.  Muscle flexibility impairment to have improved by at least 50% all groups  Baseline:  Goal status: MET 12/17/22  4.  Will be able to get  up from the couch without increase in pain  Baseline:  Goal status: MET 12/17/22   LONG TERM GOALS: Target date: 01/01/2023    MMT to improve by at least 1 grade in all weak groups  Baseline:  Goal status: MET 12/17/22  2.  Pain to be no more than 3/10 at worst  Baseline:  Goal status: MET 12/17/22  3.  Will be able to get up from the floor on a Mod(I) basis without increase in pain  Baseline:  Goal status: MET 12/17/22  4.  Will have been able to return to regular walking program without increase in pain (goal of 3 miles) Baseline:  Goal status: MET 12/17/22  5.  Will be compliant with appropriate progressive HEP vs gym program to maintain functional gains/prevent recurrence of pain  Baseline:  Goal status: MET 12/17/22    PLAN:  PT FREQUENCY: 2x/week  PT DURATION: 6 weeks  PLANNED INTERVENTIONS: Therapeutic exercises, Therapeutic activity, Neuromuscular re-education, Balance training, Gait training, Patient/Family education, Self Care, Joint mobilization, Stair training, Aquatic Therapy, Dry Needling, Electrical stimulation, Cryotherapy, Moist heat, Taping, Ultrasound, Ionotophoresis 35m/ml Dexamethasone, Manual therapy, and Re-evaluation  PLAN FOR NEXT SESSION: Discharge     JKearney Hard PT, MPT 12/17/22 2:19 PM  PHYSICAL THERAPY DISCHARGE SUMMARY  Visits from Start of Care: 6  Current functional level related to goals / functional outcomes: See above   Remaining deficits: See above   Education / Equipment: HEP   Patient agrees to discharge. Patient goals were met. Patient is being discharged due to meeting the stated rehab goals.  12/17/22 2:19 PM

## 2022-12-19 ENCOUNTER — Encounter: Payer: Medicare Other | Admitting: Physical Therapy

## 2022-12-24 ENCOUNTER — Encounter: Payer: Medicare Other | Admitting: Physical Therapy

## 2022-12-26 ENCOUNTER — Encounter: Payer: Medicare Other | Admitting: Physical Therapy

## 2023-01-25 IMAGING — MG MM DIGITAL SCREENING BILAT W/ TOMO AND CAD
8 series · 9 of 24 positions shown · non-contrast
Comparison: Previous exam(s).

CLINICAL DATA: Screening.

EXAM:
DIGITAL SCREENING BILATERAL MAMMOGRAM WITH TOMOSYNTHESIS AND CAD
TECHNIQUE: Bilateral screening digital craniocaudal and mediolateral oblique
mammograms were obtained. Bilateral screening digital breast
tomosynthesis was performed. The images were evaluated with
computer-aided detection.

[L CC synth-2D]
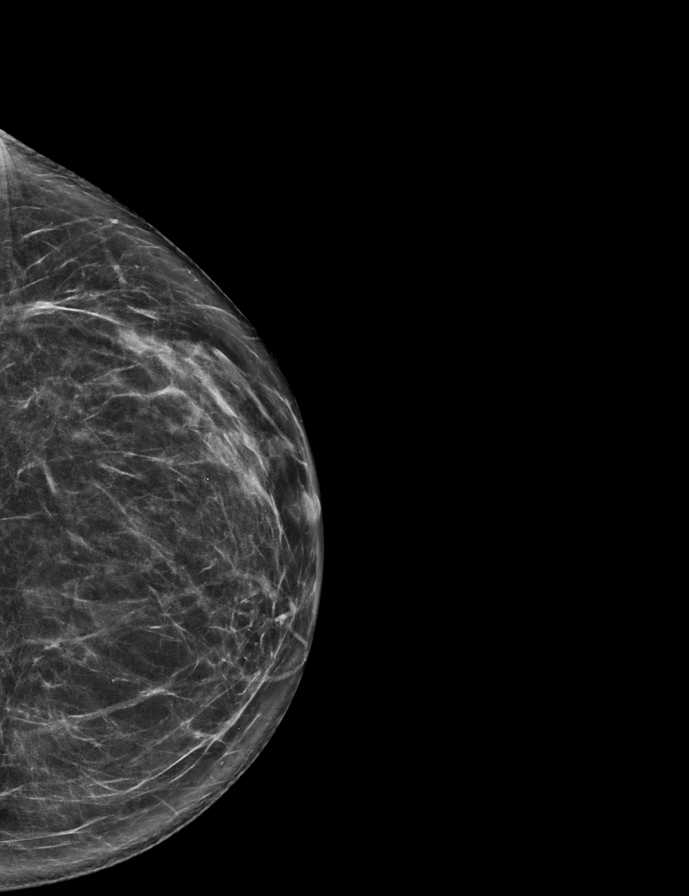

[R MLO synth-2D]
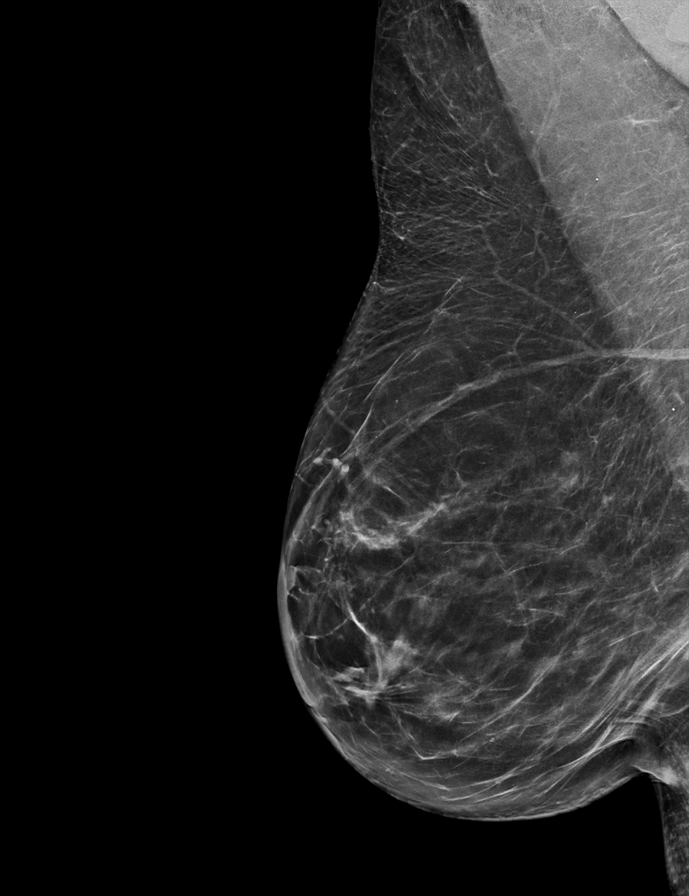

[R CC synth-2D]
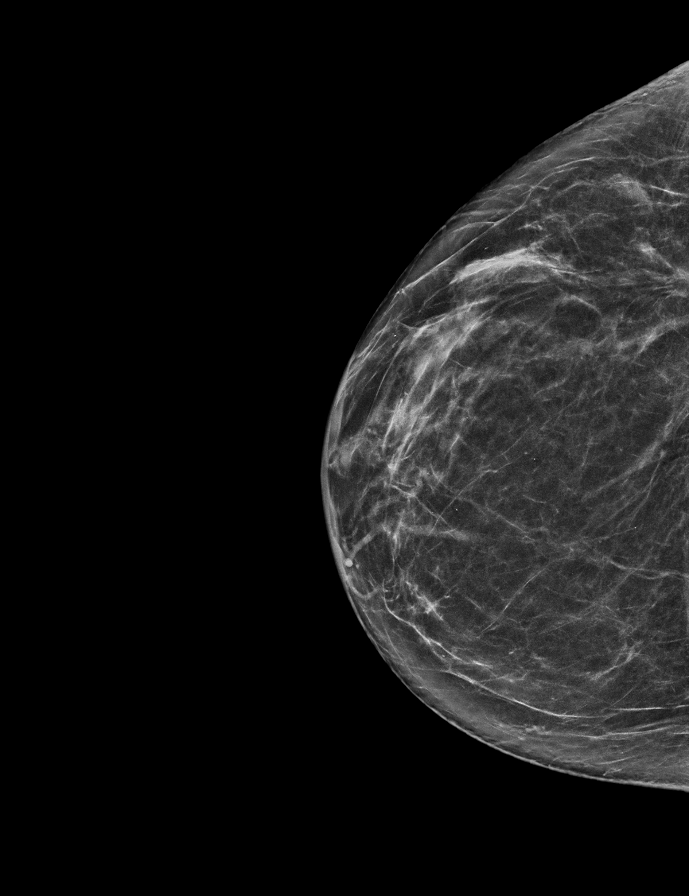

[L MLO synth-2D]
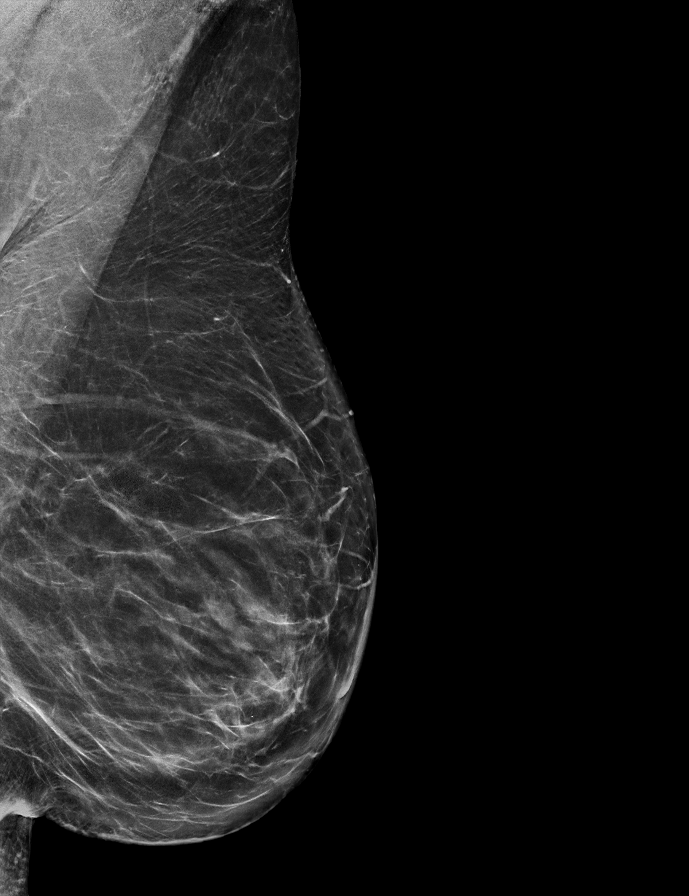

[L CC tomo · 2 of 63 frames shown]
[frame 21/63]
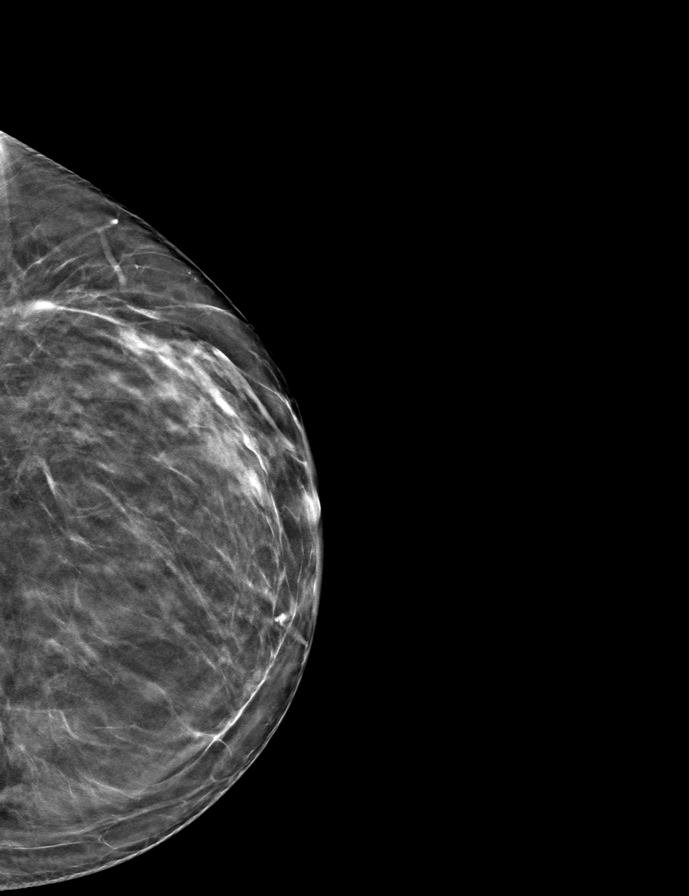
[frame 32/63]
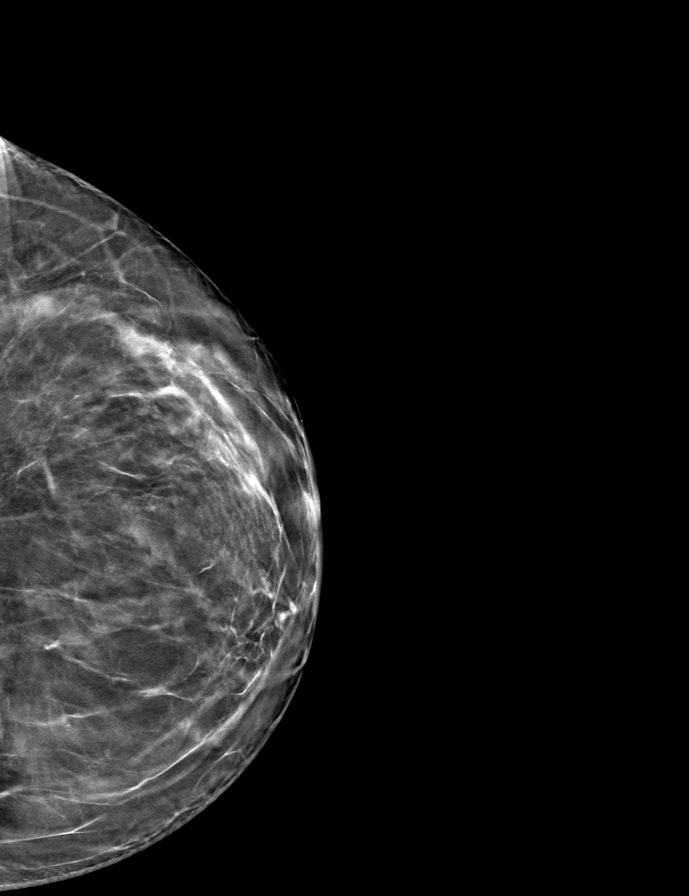

[R CC tomo · tomo slice 31/61.0]
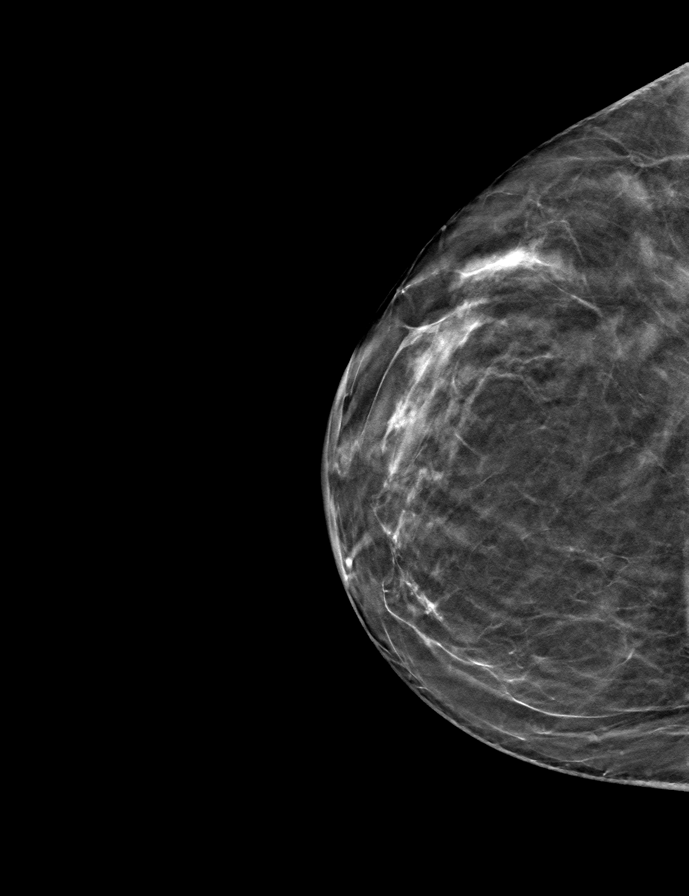

[R MLO tomo · tomo slice 37/73.0]
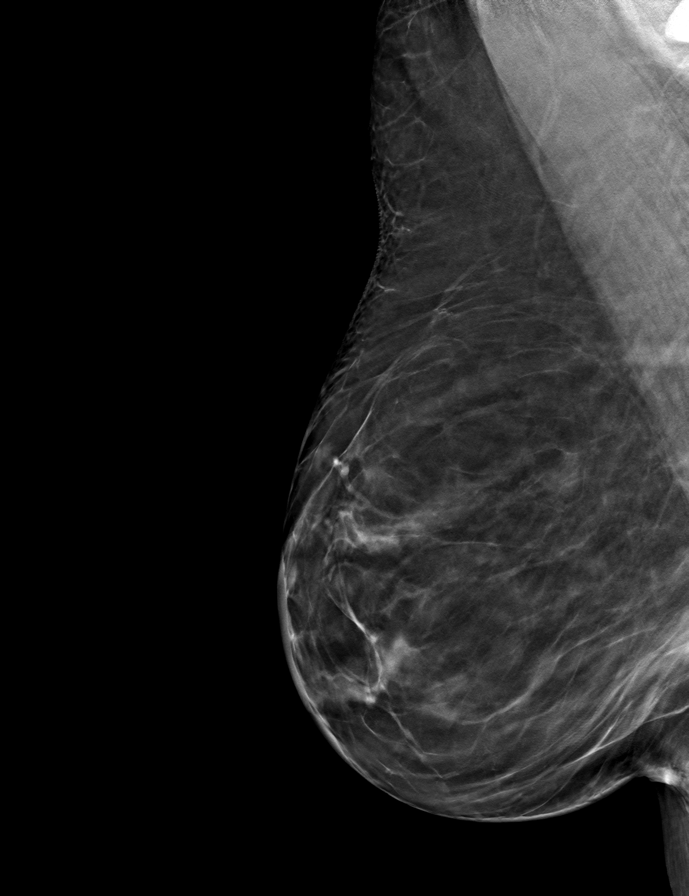

[L MLO tomo · tomo slice 39/76.0]
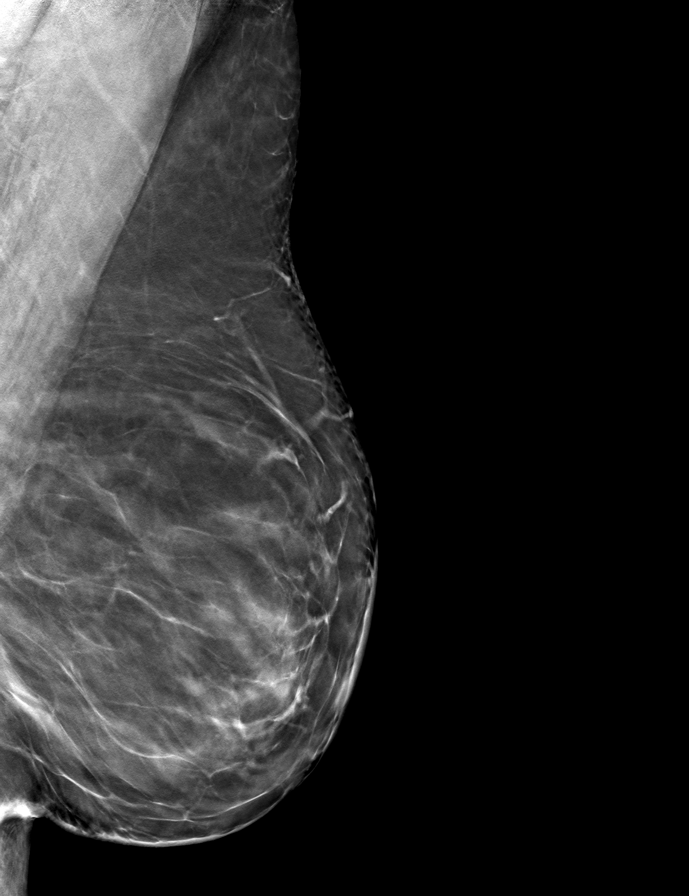

[9 of 24 positions shown; findings below may reference images not displayed]

ACR Breast Density Category b: There are scattered areas of
fibroglandular density.
FINDINGS: There are no findings suspicious for malignancy.
IMPRESSION: No mammographic evidence of malignancy. A result letter of this
screening mammogram will be mailed directly to the patient.

RECOMMENDATION:
Screening mammogram in one year. (Code:51-O-LD2)

BI-RADS CATEGORY  1: Negative.

## 2023-03-05 ENCOUNTER — Other Ambulatory Visit: Payer: Self-pay | Admitting: Internal Medicine

## 2023-03-05 DIAGNOSIS — E782 Mixed hyperlipidemia: Secondary | ICD-10-CM

## 2023-03-11 ENCOUNTER — Other Ambulatory Visit (HOSPITAL_BASED_OUTPATIENT_CLINIC_OR_DEPARTMENT_OTHER): Payer: Self-pay

## 2023-03-11 DIAGNOSIS — Z1231 Encounter for screening mammogram for malignant neoplasm of breast: Secondary | ICD-10-CM

## 2023-03-13 ENCOUNTER — Encounter (HOSPITAL_BASED_OUTPATIENT_CLINIC_OR_DEPARTMENT_OTHER): Payer: Self-pay | Admitting: Radiology

## 2023-03-13 ENCOUNTER — Ambulatory Visit (HOSPITAL_BASED_OUTPATIENT_CLINIC_OR_DEPARTMENT_OTHER)
Admission: RE | Admit: 2023-03-13 | Discharge: 2023-03-13 | Disposition: A | Discharge status: Home / Self Care | Payer: Medicare Other | Source: Ambulatory Visit | Attending: Internal Medicine | Admitting: Internal Medicine

## 2023-03-13 DIAGNOSIS — Z1231 Encounter for screening mammogram for malignant neoplasm of breast: Secondary | ICD-10-CM | POA: Insufficient documentation

## 2023-05-15 ENCOUNTER — Encounter: Payer: Self-pay | Admitting: Internal Medicine

## 2023-05-15 ENCOUNTER — Ambulatory Visit (INDEPENDENT_AMBULATORY_CARE_PROVIDER_SITE_OTHER): Payer: Medicare Other | Admitting: Internal Medicine

## 2023-05-15 VITALS — BP 130/70 | HR 75 | Temp 97.9°F | Ht 62.21 in | Wt 146.3 lb

## 2023-05-15 DIAGNOSIS — E782 Mixed hyperlipidemia: Secondary | ICD-10-CM | POA: Diagnosis not present

## 2023-05-15 DIAGNOSIS — E559 Vitamin D deficiency, unspecified: Secondary | ICD-10-CM

## 2023-05-15 DIAGNOSIS — G4700 Insomnia, unspecified: Secondary | ICD-10-CM | POA: Diagnosis not present

## 2023-05-15 DIAGNOSIS — N1831 Chronic kidney disease, stage 3a: Secondary | ICD-10-CM

## 2023-05-15 DIAGNOSIS — M810 Age-related osteoporosis without current pathological fracture: Secondary | ICD-10-CM | POA: Diagnosis not present

## 2023-05-15 DIAGNOSIS — Z0000 Encounter for general adult medical examination without abnormal findings: Secondary | ICD-10-CM | POA: Diagnosis not present

## 2023-05-15 MED ORDER — ZOLPIDEM TARTRATE 5 MG PO TABS: 5.0000 mg | ORAL_TABLET | Freq: Every evening | ORAL | 1 refills | Status: DC | PRN

## 2023-05-15 MED ORDER — ALENDRONATE SODIUM 70 MG PO TABS
70.0000 mg | ORAL_TABLET | ORAL | 11 refills | Status: AC
Start: 2023-05-15 — End: ?

## 2023-05-15 NOTE — Progress Notes (Signed)
Established Patient Office Visit     CC/Reason for Visit: Annual preventive exam, follow-up chronic conditions  HPI: Tracy Pena is a 72 y.o. female who is coming in today for the above mentioned reasons. Past Medical History is significant for: Osteoporosis, hyperlipidemia, insomnia, vitamin D deficiency.  She has been feeling well other than continued issues with sleeping.  This has been a long-term issue for her.  She quit taking trazodone because it was exacerbating restless legs.  She has been using over-the-counter's without relief.  She has been working on sleep hygiene.  She used Ambien in the past and this worked well for her, she wonders about a prescription for it.  She has routine eye and dental care, wears hearing aids bilaterally, exercises routinely.  She is overdue for Tdap, RSV vaccines.  Cancer screening is up-to-date.   Past Medical/Surgical History: Past Medical History:  Diagnosis Date   Cataract    "small"   Chronic kidney disease    CKD   Complication of anesthesia    nausea after anesthesia   Decreased hearing    H/O cholesteatoma    [facility]   Hx of varicella    as a child   Hyperlipidemia    Osteopenia    3 YEARS AGO UPDATED 01/01/22   PONV (postoperative nausea and vomiting)     Past Surgical History:  Procedure Laterality Date   BREAST REDUCTION SURGERY Bilateral 12/06/2020   Procedure: MAMMARY REDUCTION  (BREAST);  Surgeon: Peggye Form, DO;  Location: [facility];  Service: Plastics;  Laterality: Bilateral;  2.5 hours, please   COLONOSCOPY     INNER EAR SURGERY     at [facility] for Cholesteatoma artificial ear drum,2 ear surgeries updated 01/01/22   REPEAT CESAREAN SECTION     had 3    Social History:  reports that she has never smoked. She has never been exposed to tobacco smoke. She has never used smokeless tobacco. She reports current alcohol use of about 3.0 standard drinks of alcohol per week. She reports  that she does not use drugs.  Allergies: No Known Allergies  Family History:  Family History  Problem Relation Age of Onset   Lung cancer Mother    Thyroid disease Daughter        graves   Celiac disease Grandchild    Diabetes type I Neg Hx    Colon cancer Neg Hx    Colon polyps Neg Hx    Esophageal cancer Neg Hx    Stomach cancer Neg Hx    Rectal cancer Neg Hx    Breast cancer Neg Hx      Current Outpatient Medications:    atorvastatin (LIPITOR) 20 MG tablet, TAKE 1 TABLET BY MOUTH DAILY, Disp: 90 tablet, Rfl: 0   Calcium Carbonate (CALCIUM 600 PO), Take by mouth., Disp: , Rfl:    zolpidem (AMBIEN) 5 MG tablet, Take 1 tablet (5 mg total) by mouth at bedtime as needed for sleep., Disp: 15 tablet, Rfl: 1   alendronate (FOSAMAX) 70 MG tablet, Take 1 tablet (70 mg total) by mouth every 7 (seven) days. Take with a full glass of water on an empty stomach., Disp: 4 tablet, Rfl: 11   traZODone (DESYREL) 50 MG tablet, TAKE 1 TABLET BY MOUTH AT BEDTIME AS NEEDED FOR SLEEP (Patient not taking: Reported on 05/14/2023), Disp: 30 tablet, Rfl: 3  Review of Systems:  Negative unless indicated in HPI.   Physical Exam:  Vitals:   05/15/23 0906 05/15/23 0933  BP: (!) 166/70 130/70  Pulse: 75   Temp: 97.9 F (36.6 C)   TempSrc: Oral   SpO2: 98%   Weight: 146 lb 4.8 oz (66.4 kg)   Height: 5' 2.21" (1.58 m)     Body mass index is 26.58 kg/m.   Physical Exam Vitals reviewed.  Constitutional:      General: She is not in acute distress.    Appearance: Normal appearance. She is not ill-appearing, toxic-appearing or diaphoretic.  HENT:     Head: Normocephalic.     Right Ear: Tympanic membrane, ear canal and external ear normal. There is no impacted cerumen.     Left Ear: Tympanic membrane, ear canal and external ear normal. There is no impacted cerumen.     Nose: Nose normal.     Mouth/Throat:     Mouth: Mucous membranes are moist.     Pharynx: Oropharynx is clear. No oropharyngeal  exudate or posterior oropharyngeal erythema.  Eyes:     General: No scleral icterus.       Right eye: No discharge.        Left eye: No discharge.     Conjunctiva/sclera: Conjunctivae normal.     Pupils: Pupils are equal, round, and reactive to light.  Neck:     Vascular: No carotid bruit.  Cardiovascular:     Rate and Rhythm: Normal rate and regular rhythm.     Pulses: Normal pulses.     Heart sounds: Normal heart sounds.  Pulmonary:     Effort: Pulmonary effort is normal. No respiratory distress.     Breath sounds: Normal breath sounds.  Abdominal:     General: Abdomen is flat. Bowel sounds are normal.     Palpations: Abdomen is soft.  Musculoskeletal:        General: Normal range of motion.     Cervical back: Normal range of motion.  Skin:    General: Skin is warm and dry.  Neurological:     General: No focal deficit present.     Mental Status: She is alert and oriented to person, place, and time. Mental status is at baseline.  Psychiatric:        Mood and Affect: Mood normal.        Behavior: Behavior normal.        Thought Content: Thought content normal.        Judgment: Judgment normal.    Subsequent Medicare wellness visit   1. Risk factors, based on past  M,S,F - Cardiac Risk Factors include: advanced age (>63men, >21 women)   2.  Physical activities: Dietary issues and exercise activities discussed:      3.  Depression/mood:  Flowsheet Row Office Visit from 05/15/2023 in [facility] Total Score 4        4.  ADL's:    05/14/2023    4:57 PM  In your present state of health, do you have any difficulty performing the following activities:  Hearing? 0  Comment wears hearing aids  Vision? 0  Difficulty concentrating or making decisions? 0  Walking or climbing stairs? 0  Dressing or bathing? 0  Doing errands, shopping? 0  Preparing Food and eating ? N  Using the Toilet? N  In the past six months, have you  accidently leaked urine? N  Do you have problems with loss of bowel control? N  Managing your Medications? N  Managing your  Finances? N  Housekeeping or managing your Housekeeping? N     5.  Fall risk:     12/06/2020    7:31 AM 11/20/2021    8:57 AM 02/19/2022   11:21 AM 05/14/2023    5:03 PM 05/15/2023    9:07 AM  Fall Risk  Falls in the past year?  0 0 0 0  Was there an injury with Fall?  0 0 0 0  Fall Risk Category Calculator  0 0 0 0  Fall Risk Category (Retired)  Low Low    (RETIRED) Patient Fall Risk Level Low fall risk Low fall risk Low fall risk    Patient at Risk for Falls Due to  No Fall Risks No Fall Risks  No Fall Risks  Fall risk Follow up  Falls evaluation completed Falls evaluation completed Falls evaluation completed Falls evaluation completed     6.  Home safety: No problems identified   7.  Height weight, and visual acuity: height and weight as above, vision/hearing: Vision Screening   Right eye Left eye Both eyes  Without correction     With correction 20/13 20/13 20/15      8.  Counseling: Counseling given: Not Answered    9. Lab orders based on risk factors: Laboratory update will be reviewed   10. Cognitive assessment:        05/14/2023    5:00 PM  6CIT Screen  What Year? 0 points  What month? 0 points  What time? 0 points  Count back from 20 0 points  Months in reverse 0 points  Repeat phrase 0 points  Total Score 0 points     11. Screening: Patient provided with a written and personalized 5-10 year screening schedule in the AVS. Health Maintenance  Topic Date Due   DTaP/Tdap/Td vaccine (3 - Tdap) 05/20/2022   COVID-19 Vaccine (4 - 2023-24 season) 06/28/2022   Medicare Annual Wellness Visit  05/14/2024   Mammogram  03/12/2025   Colon Cancer Screening  01/16/2032   Pneumonia Vaccine  Completed   DEXA scan (bone density measurement)  Completed   Hepatitis C Screening  Completed   Zoster (Shingles) Vaccine  Completed   HPV Vaccine  Aged  Out   Flu Shot  Discontinued    12. Provider List Update: Patient Care Team    Relationship Specialty Notifications Start End  Philip Aspen, Limmie Jilliana, MD PCP - General Internal Medicine  05/13/19   Hart Carwin, MD (Inactive)  Gastroenterology  09/17/11   Twana First, MD Referring Physician Otolaryngology  07/28/16    Comment: hx cholesteotoma  and dec hearing     13. Advance Directives: Does Patient Have a Medical Advance Directive?: Yes Type of Advance Directive: Out of facility DNR (pink MOST or yellow form), Living will, Healthcare Power of Attorney Does patient want to make changes to medical advance directive?: No - Patient declined Copy of Healthcare Power of Attorney in Chart?: No - copy requested  14. Opioids: Patient is not on any opioid prescriptions and has no risk factors for a substance use disorder.   15.   Goals   None      I have personally reviewed and noted the following in the patient's chart:   Medical and social history Use of alcohol, tobacco or illicit drugs  Current medications and supplements Functional ability and status Nutritional status Physical activity Advanced directives List of other physicians Hospitalizations, surgeries, and ER visits in previous 12 months Vitals Screenings  to include cognitive, depression, and falls Referrals and appointments  In addition, I have reviewed and discussed with patient certain preventive protocols, quality metrics, and best practice recommendations. A written personalized care plan for preventive services as well as general preventive health recommendations were provided to patient.   Impression and Plan:  Medicare annual wellness visit, subsequent  Mixed hyperlipidemia -     Lipid panel; Future  Insomnia, unspecified type -     Zolpidem Tartrate; Take 1 tablet (5 mg total) by mouth at bedtime as needed for sleep.  Dispense: 15 tablet; Refill: 1  Age-related osteoporosis without current  pathological fracture -     Alendronate Sodium; Take 1 tablet (70 mg total) by mouth every 7 (seven) days. Take with a full glass of water on an empty stomach.  Dispense: 4 tablet; Refill: 11  Stage 3a chronic kidney disease (HCC) -     CBC with Differential/Platelet; Future -     Comprehensive metabolic panel; Future  Vitamin D deficiency -     VITAMIN D 25 Hydroxy (Vit-D Deficiency, Fractures); Future  -Recommend routine eye and dental care. -Healthy lifestyle discussed in detail. -Labs to be updated today. -Prostate cancer screening: N/A Health Maintenance  Topic Date Due   DTaP/Tdap/Td vaccine (3 - Tdap) 05/20/2022   COVID-19 Vaccine (4 - 2023-24 season) 06/28/2022   Medicare Annual Wellness Visit  05/14/2024   Mammogram  03/12/2025   Colon Cancer Screening  01/16/2032   Pneumonia Vaccine  Completed   DEXA scan (bone density measurement)  Completed   Hepatitis C Screening  Completed   Zoster (Shingles) Vaccine  Completed   HPV Vaccine  Aged Out   Flu Shot  Discontinued    -Advised to update Tdap and RSV at pharmacy. -Lung cancer screening is up-to-date. -She would like to switch her osteoporosis treatment from Prolia to Fosamax, have ordered. -Her insomnia continues to be her main concern.  She does not tolerate trazodone well because it causes restless legs.  She had been on Ambien at one point and it worked well for her.  We have discussed Ambien 5 mg that she will try to take only every third day in order to avoid tolerance issues.   Chaya Jan, MD [facility]

## 2023-05-16 ENCOUNTER — Other Ambulatory Visit: Payer: Medicare Other

## 2023-05-19 ENCOUNTER — Other Ambulatory Visit (INDEPENDENT_AMBULATORY_CARE_PROVIDER_SITE_OTHER): Payer: Medicare Other

## 2023-05-19 DIAGNOSIS — N1831 Chronic kidney disease, stage 3a: Secondary | ICD-10-CM

## 2023-05-19 DIAGNOSIS — E782 Mixed hyperlipidemia: Secondary | ICD-10-CM

## 2023-05-19 DIAGNOSIS — E559 Vitamin D deficiency, unspecified: Secondary | ICD-10-CM | POA: Diagnosis not present

## 2023-05-19 LAB — LIPID PANEL
Cholesterol: 173 mg/dL (ref 0–200)
HDL: 59.1 mg/dL (ref >39.00)
LDL Cholesterol: 76 mg/dL (ref 0–99)
NonHDL: 113.97
Total CHOL/HDL Ratio: 3
Triglycerides: 190 mg/dL — ABNORMAL HIGH (ref 0.0–149.0)
VLDL: 38 mg/dL (ref 0.0–40.0)

## 2023-05-19 LAB — CBC WITH DIFFERENTIAL/PLATELET
Basophils Absolute: 0 10*3/uL (ref 0.0–0.1)
Basophils Relative: 0.8 % (ref 0.0–3.0)
Eosinophils Absolute: 0.3 10*3/uL (ref 0.0–0.7)
Eosinophils Relative: 5.2 % — ABNORMAL HIGH (ref 0.0–5.0)
HCT: 39.5 % (ref 36.0–46.0)
Hemoglobin: 13.2 g/dL (ref 12.0–15.0)
Lymphocytes Relative: 39.5 % (ref 12.0–46.0)
Lymphs Abs: 2.1 10*3/uL (ref 0.7–4.0)
MCHC: 33.3 g/dL (ref 30.0–36.0)
MCV: 90.2 fl (ref 78.0–100.0)
Monocytes Absolute: 0.4 10*3/uL (ref 0.1–1.0)
Monocytes Relative: 7.3 % (ref 3.0–12.0)
Neutro Abs: 2.6 10*3/uL (ref 1.4–7.7)
Neutrophils Relative %: 47.2 % (ref 43.0–77.0)
Platelets: 282 10*3/uL (ref 150.0–400.0)
RBC: 4.38 Mil/uL (ref 3.87–5.11)
RDW: 14.2 % (ref 11.5–15.5)
WBC: 5.4 10*3/uL (ref 4.0–10.5)

## 2023-05-19 LAB — COMPREHENSIVE METABOLIC PANEL
ALT: 13 U/L (ref 0–35)
AST: 18 U/L (ref 0–37)
Albumin: 4.1 g/dL (ref 3.5–5.2)
Alkaline Phosphatase: 70 U/L (ref 39–117)
BUN: 22 mg/dL (ref 6–23)
CO2: 28 mEq/L (ref 19–32)
Calcium: 9.4 mg/dL (ref 8.4–10.5)
Chloride: 106 mEq/L (ref 96–112)
Creatinine, Ser: 1.1 mg/dL (ref 0.40–1.20)
GFR: 50.33 mL/min — ABNORMAL LOW (ref >60.00)
Glucose, Bld: 86 mg/dL (ref 70–99)
Potassium: 4.1 mEq/L (ref 3.5–5.1)
Sodium: 143 mEq/L (ref 135–145)
Total Bilirubin: 0.5 mg/dL (ref 0.2–1.2)
Total Protein: 6.5 g/dL (ref 6.0–8.3)

## 2023-05-19 LAB — VITAMIN D 25 HYDROXY (VIT D DEFICIENCY, FRACTURES): VITD: 38.53 ng/mL (ref 30.00–100.00)

## 2023-07-08 ENCOUNTER — Encounter: Payer: Self-pay | Admitting: Internal Medicine

## 2023-07-10 ENCOUNTER — Other Ambulatory Visit: Payer: Self-pay | Admitting: Internal Medicine

## 2023-07-10 DIAGNOSIS — G4700 Insomnia, unspecified: Secondary | ICD-10-CM

## 2023-07-10 MED ORDER — ZOLPIDEM TARTRATE 10 MG PO TABS: 10.0000 mg | ORAL_TABLET | Freq: Every evening | ORAL | 1 refills | Status: DC | PRN

## 2023-07-18 ENCOUNTER — Other Ambulatory Visit: Payer: Self-pay | Admitting: Internal Medicine

## 2023-07-18 DIAGNOSIS — G4700 Insomnia, unspecified: Secondary | ICD-10-CM

## 2023-09-16 ENCOUNTER — Other Ambulatory Visit: Payer: Self-pay | Admitting: Internal Medicine

## 2023-09-16 DIAGNOSIS — G4700 Insomnia, unspecified: Secondary | ICD-10-CM

## 2023-11-13 ENCOUNTER — Other Ambulatory Visit: Payer: Self-pay | Admitting: Internal Medicine

## 2023-11-13 DIAGNOSIS — E782 Mixed hyperlipidemia: Secondary | ICD-10-CM

## 2024-01-05 ENCOUNTER — Telehealth: Payer: Self-pay

## 2024-01-05 NOTE — Telephone Encounter (Signed)
 Copied from CRM [id number]. Topic: General - Call Back - No Documentation >> Jan 05, 2024 11:32 AM Almira Coaster wrote: Reason for CRM: Patient states she received a call from the office; however, she could not answer at the moment. No voicemail was left.

## 2024-01-06 NOTE — Telephone Encounter (Signed)
 Im not sure.  I did not call the patient.

## 2024-03-11 ENCOUNTER — Other Ambulatory Visit: Payer: Self-pay | Admitting: Internal Medicine

## 2024-03-11 DIAGNOSIS — Z1231 Encounter for screening mammogram for malignant neoplasm of breast: Secondary | ICD-10-CM

## 2024-03-11 DIAGNOSIS — G4700 Insomnia, unspecified: Secondary | ICD-10-CM

## 2024-03-16 ENCOUNTER — Encounter (HOSPITAL_BASED_OUTPATIENT_CLINIC_OR_DEPARTMENT_OTHER): Payer: Self-pay | Admitting: Radiology

## 2024-03-16 ENCOUNTER — Ambulatory Visit (HOSPITAL_BASED_OUTPATIENT_CLINIC_OR_DEPARTMENT_OTHER)
Admission: RE | Admit: 2024-03-16 | Discharge: 2024-03-16 | Disposition: A | Discharge status: Home / Self Care | Source: Ambulatory Visit | Attending: Internal Medicine | Admitting: Internal Medicine

## 2024-03-16 DIAGNOSIS — Z1231 Encounter for screening mammogram for malignant neoplasm of breast: Secondary | ICD-10-CM | POA: Diagnosis not present

## 2024-05-18 ENCOUNTER — Ambulatory Visit (INDEPENDENT_AMBULATORY_CARE_PROVIDER_SITE_OTHER): Admitting: Internal Medicine

## 2024-05-18 ENCOUNTER — Encounter: Payer: Self-pay | Admitting: Internal Medicine

## 2024-05-18 VITALS — BP 120/80 | HR 75 | Temp 98.1°F | Ht 63.0 in | Wt 137.5 lb

## 2024-05-18 DIAGNOSIS — E559 Vitamin D deficiency, unspecified: Secondary | ICD-10-CM

## 2024-05-18 DIAGNOSIS — N1831 Chronic kidney disease, stage 3a: Secondary | ICD-10-CM

## 2024-05-18 DIAGNOSIS — Z0000 Encounter for general adult medical examination without abnormal findings: Secondary | ICD-10-CM

## 2024-05-18 DIAGNOSIS — E782 Mixed hyperlipidemia: Secondary | ICD-10-CM

## 2024-05-18 DIAGNOSIS — Z780 Asymptomatic menopausal state: Secondary | ICD-10-CM

## 2024-05-18 LAB — CBC WITH DIFFERENTIAL/PLATELET
Basophils Absolute: 0 K/uL (ref 0.0–0.1)
Basophils Relative: 0.5 % (ref 0.0–3.0)
Eosinophils Absolute: 0.3 K/uL (ref 0.0–0.7)
Eosinophils Relative: 5.8 % — ABNORMAL HIGH (ref 0.0–5.0)
HCT: 40.6 % (ref 36.0–46.0)
Hemoglobin: 13.5 g/dL (ref 12.0–15.0)
Lymphocytes Relative: 41.9 % (ref 12.0–46.0)
Lymphs Abs: 1.9 K/uL (ref 0.7–4.0)
MCHC: 33.2 g/dL (ref 30.0–36.0)
MCV: 90.8 fl (ref 78.0–100.0)
Monocytes Absolute: 0.4 K/uL (ref 0.1–1.0)
Monocytes Relative: 9.2 % (ref 3.0–12.0)
Neutro Abs: 1.9 K/uL (ref 1.4–7.7)
Neutrophils Relative %: 42.6 % — ABNORMAL LOW (ref 43.0–77.0)
Platelets: 322 K/uL (ref 150.0–400.0)
RBC: 4.47 Mil/uL (ref 3.87–5.11)
RDW: 14.2 % (ref 11.5–15.5)
WBC: 4.5 K/uL (ref 4.0–10.5)

## 2024-05-18 LAB — LIPID PANEL
Cholesterol: 217 mg/dL — ABNORMAL HIGH (ref 0–200)
HDL: 73.1 mg/dL (ref >39.00)
LDL Cholesterol: 117 mg/dL — ABNORMAL HIGH (ref 0–99)
NonHDL: 144.03
Total CHOL/HDL Ratio: 3
Triglycerides: 133 mg/dL (ref 0.0–149.0)
VLDL: 26.6 mg/dL (ref 0.0–40.0)

## 2024-05-18 LAB — COMPREHENSIVE METABOLIC PANEL WITH GFR
ALT: 15 U/L (ref 0–35)
AST: 20 U/L (ref 0–37)
Albumin: 4.7 g/dL (ref 3.5–5.2)
Alkaline Phosphatase: 54 U/L (ref 39–117)
BUN: 28 mg/dL — ABNORMAL HIGH (ref 6–23)
CO2: 28 meq/L (ref 19–32)
Calcium: 9.9 mg/dL (ref 8.4–10.5)
Chloride: 104 meq/L (ref 96–112)
Creatinine, Ser: 1.11 mg/dL (ref 0.40–1.20)
GFR: 49.43 mL/min — ABNORMAL LOW (ref >60.00)
Glucose, Bld: 95 mg/dL (ref 70–99)
Potassium: 4 meq/L (ref 3.5–5.1)
Sodium: 141 meq/L (ref 135–145)
Total Bilirubin: 0.5 mg/dL (ref 0.2–1.2)
Total Protein: 7.2 g/dL (ref 6.0–8.3)

## 2024-05-18 LAB — VITAMIN D 25 HYDROXY (VIT D DEFICIENCY, FRACTURES): VITD: 44.33 ng/mL (ref 30.00–100.00)

## 2024-05-18 NOTE — Progress Notes (Signed)
 Medicare Wellness question were answered 05/14/24 and 05/18/24.

## 2024-05-18 NOTE — Progress Notes (Signed)
 Established Patient Office Visit     CC/Reason for Visit: Subsequent Medicare wellness visit  HPI: Tracy Pena is a 73 y.o. female who is coming in today for the above mentioned reasons. Past Medical History is significant for: Hyperlipidemia, insomnia, osteoporosis.  Feeling well.  She discontinued Fosamax  and atorvastatin  because she thought they were contributing to her aches and pains.  Has routine eye and dental care.  Is due for Tdap and a bone density.  Cancer screening is up-to-date.   Past Medical/Surgical History: Past Medical History:  Diagnosis Date   Cataract    small   Chronic kidney disease    CKD   Complication of anesthesia    nausea after anesthesia   Decreased hearing    H/O cholesteatoma    [facility]   Hx of varicella    as a child   Hyperlipidemia    Osteopenia    3 YEARS AGO UPDATED 01/01/22   PONV (postoperative nausea and vomiting)     Past Surgical History:  Procedure Laterality Date   BREAST REDUCTION SURGERY Bilateral 12/06/2020   Procedure: MAMMARY REDUCTION  (BREAST);  Surgeon: Lowery Estefana RAMAN, DO;  Location: [facility];  Service: Plastics;  Laterality: Bilateral;  2.5 hours, please   COLONOSCOPY     INNER EAR SURGERY     at [facility] for Cholesteatoma artificial ear drum,2 ear surgeries updated 01/01/22   REPEAT CESAREAN SECTION     had 3    Social History:  reports that she has never smoked. She has never been exposed to tobacco smoke. She has never used smokeless tobacco. She reports current alcohol use of about 3.0 standard drinks of alcohol per week. She reports that she does not use drugs.  Allergies: No Known Allergies  Family History:  Family History  Problem Relation Age of Onset   Lung cancer Mother    Thyroid  disease Daughter        graves   Celiac disease Grandchild    Diabetes type I Neg Hx    Colon cancer Neg Hx    Colon polyps Neg Hx    Esophageal cancer Neg Hx    Stomach cancer Neg  Hx    Rectal cancer Neg Hx    Breast cancer Neg Hx      Current Outpatient Medications:    Calcium  Carbonate (CALCIUM  600 PO), Take by mouth., Disp: , Rfl:    traZODone  (DESYREL ) 50 MG tablet, TAKE 1 TABLET BY MOUTH NIGHTLY AT BEDTIME AS NEEDED FOR SLEEP, Disp: 30 tablet, Rfl: 3   zolpidem  (AMBIEN ) 10 MG tablet, TAKE 1 TABLET BY MOUTH NIGHTLY AT BEDTIME AS NEEDED, Disp: 30 tablet, Rfl: 1   alendronate  (FOSAMAX ) 70 MG tablet, Take 1 tablet (70 mg total) by mouth every 7 (seven) days. Take with a full glass of water on an empty stomach. (Patient not taking: Reported on 05/18/2024), Disp: 4 tablet, Rfl: 11   atorvastatin  (LIPITOR) 20 MG tablet, TAKE 1 TABLET BY MOUTH DAILY (Patient not taking: Reported on 05/18/2024), Disp: 90 tablet, Rfl: 1  Review of Systems:  Negative unless indicated in HPI.   Physical Exam: Vitals:   05/18/24 0945  BP: 120/80  Pulse: 75  Temp: 98.1 F (36.7 C)  TempSrc: Oral  SpO2: 98%  Weight: 137 lb 8 oz (62.4 kg)  Height: 5' 3 (1.6 m)    Body mass index is 24.36 kg/m.   Physical Exam Vitals reviewed.  Constitutional:  General: She is not in acute distress.    Appearance: Normal appearance. She is not ill-appearing, toxic-appearing or diaphoretic.  HENT:     Head: Normocephalic.     Right Ear: Tympanic membrane, ear canal and external ear normal. There is no impacted cerumen.     Left Ear: Tympanic membrane, ear canal and external ear normal. There is no impacted cerumen.     Nose: Nose normal.     Mouth/Throat:     Mouth: Mucous membranes are moist.     Pharynx: Oropharynx is clear. No oropharyngeal exudate or posterior oropharyngeal erythema.  Eyes:     General: No scleral icterus.       Right eye: No discharge.        Left eye: No discharge.     Conjunctiva/sclera: Conjunctivae normal.     Pupils: Pupils are equal, round, and reactive to light.  Neck:     Vascular: No carotid bruit.  Cardiovascular:     Rate and Rhythm: Normal rate  and regular rhythm.     Pulses: Normal pulses.     Heart sounds: Normal heart sounds.  Pulmonary:     Effort: Pulmonary effort is normal. No respiratory distress.     Breath sounds: Normal breath sounds.  Abdominal:     General: Abdomen is flat. Bowel sounds are normal.     Palpations: Abdomen is soft.  Musculoskeletal:        General: Normal range of motion.     Cervical back: Normal range of motion.  Skin:    General: Skin is warm and dry.  Neurological:     General: No focal deficit present.     Mental Status: She is alert and oriented to person, place, and time. Mental status is at baseline.  Psychiatric:        Mood and Affect: Mood normal.        Behavior: Behavior normal.        Thought Content: Thought content normal.        Judgment: Judgment normal.    Subsequent Medicare wellness visit   1. Risk factors, based on past  M,S,F - Cardiac Risk Factors include: advanced age (>29men, >68 women);dyslipidemia   2.  Physical activities: Dietary issues and exercise activities discussed:      3.  Depression/mood:  Flowsheet Row Office Visit from 05/15/2023 in [facility] Total Score 4     4.  ADL's:    05/14/2024   10:33 AM  In your present state of health, do you have any difficulty performing the following activities:  Hearing? 0  Vision? 0  Difficulty concentrating or making decisions? 0  Walking or climbing stairs? 0  Dressing or bathing? 0  Doing errands, shopping? 0  Preparing Food and eating ? N  Using the Toilet? N  In the past six months, have you accidently leaked urine? N  Do you have problems with loss of bowel control? N  Managing your Medications? N  Managing your Finances? N  Housekeeping or managing your Housekeeping? N     5.  Fall risk:     02/19/2022   11:21 AM 05/14/2023    5:03 PM 05/15/2023    9:07 AM 05/14/2024   10:33 AM 05/18/2024    9:41 AM  Fall Risk  Falls in the past year? 0 0 0 0 0  Was  there an injury with Fall? 0 0 0  0  Fall Risk  Category Calculator 0 0 0  0  Fall Risk Category (Retired) Low       (RETIRED) Patient Fall Risk Level Low fall risk       Patient at Risk for Falls Due to No Fall Risks  No Fall Risks    Fall risk Follow up Falls evaluation completed  Falls evaluation completed Falls evaluation completed  Falls evaluation completed     Data saved with a previous flowsheet row definition     6.  Home safety: No problems identified   7.  Height weight, and visual acuity: height and weight as above, vision/hearing: Vision Screening   Right eye Left eye Both eyes  Without correction     With correction 20/20 20/20 20/20      8.  Counseling: Counseling given: Not Answered    9. Lab orders based on risk factors: Laboratory update will be reviewed   10. Cognitive assessment:        05/18/2024    9:43 AM 05/14/2023    5:00 PM  6CIT Screen  What Year? 0 points 0 points  What month? 0 points 0 points  What time? 0 points 0 points  Count back from 20 0 points 0 points  Months in reverse 0 points 0 points  Repeat phrase 0 points 0 points  Total Score 0 points 0 points     11. Screening: Patient provided with a written and personalized 5-10 year screening schedule in the AVS. Health Maintenance  Topic Date Due   DTaP/Tdap/Td vaccine (3 - Tdap) 05/20/2022   COVID-19 Vaccine (4 - 2024-25 season) 06/29/2023   Medicare Annual Wellness Visit  05/18/2025   Mammogram  03/16/2026   Colon Cancer Screening  01/16/2032   Pneumococcal Vaccine for age over 24  Completed   DEXA scan (bone density measurement)  Completed   Hepatitis C Screening  Completed   Zoster (Shingles) Vaccine  Completed   Hepatitis B Vaccine  Aged Out   HPV Vaccine  Aged Out   Meningitis B Vaccine  Aged Out   Flu Shot  Discontinued    12. Provider List Update: Patient Care Team    Relationship Specialty Notifications Start End  Theophilus Andrews, Tully GRADE, MD PCP - General Internal  Medicine  05/13/19   Obie Princella HERO, MD (Inactive)  Gastroenterology  09/17/11   Merideth Almas, MD Referring Physician Otolaryngology  07/28/16    Comment: hx cholesteotoma  and dec hearing     13. Advance Directives: Does Patient Have a Medical Advance Directive?: Yes Does patient want to make changes to medical advance directive?: No - Patient declined  14. Opioids: Patient is not on any opioid prescriptions and has no risk factors for a substance use disorder.   15.   Goals      Protect My Health        - schedule and keep appointment for annual check-up    Why is this important?   Screening tests can find diseases early when they are easier to treat.  Your doctor or nurse will talk with you about which tests are important for you.  Getting shots for common diseases like the flu and shingles will help prevent them.     Notes:          I have personally reviewed and noted the following in the patient's chart:   Medical and social history Use of alcohol, tobacco or illicit drugs  Current medications and supplements Functional ability and status Nutritional  status Physical activity Advanced directives List of other physicians Hospitalizations, surgeries, and ER visits in previous 12 months Vitals Screenings to include cognitive, depression, and falls Referrals and appointments  In addition, I have reviewed and discussed with patient certain preventive protocols, quality metrics, and best practice recommendations. A written personalized care plan for preventive services as well as general preventive health recommendations were provided to patient.   Impression and Plan:  Medicare annual wellness visit, subsequent  Mixed hyperlipidemia -     Lipid panel; Future  Vitamin D  deficiency -     VITAMIN D  25 Hydroxy (Vit-D Deficiency, Fractures); Future  Stage 3a chronic kidney disease (HCC) -     CBC with Differential/Platelet; Future -     Comprehensive metabolic  panel with GFR; Future  Postmenopausal estrogen deficiency -     DG Bone Density; Future   -Recommend routine eye and dental care. -Healthy lifestyle discussed in detail. -Labs to be updated today. -Prostate cancer screening: Not applicable Health Maintenance  Topic Date Due   DTaP/Tdap/Td vaccine (3 - Tdap) 05/20/2022   COVID-19 Vaccine (4 - 2024-25 season) 06/29/2023   Medicare Annual Wellness Visit  05/18/2025   Mammogram  03/16/2026   Colon Cancer Screening  01/16/2032   Pneumococcal Vaccine for age over 64  Completed   DEXA scan (bone density measurement)  Completed   Hepatitis C Screening  Completed   Zoster (Shingles) Vaccine  Completed   Hepatitis B Vaccine  Aged Out   HPV Vaccine  Aged Out   Meningitis B Vaccine  Aged Out   Flu Shot  Discontinued     -Advised to update Tdap at pharmacy. - Check labs today to determine whether we need to resume statin. - DEXA requested.    Tully Theophilus Andrews, MD [facility]

## 2024-05-19 ENCOUNTER — Ambulatory Visit: Payer: Self-pay | Admitting: Internal Medicine

## 2024-05-19 DIAGNOSIS — E782 Mixed hyperlipidemia: Secondary | ICD-10-CM

## 2024-08-03 ENCOUNTER — Other Ambulatory Visit: Payer: Self-pay | Admitting: Internal Medicine

## 2024-08-03 DIAGNOSIS — H90A12 Conductive hearing loss, unilateral, left ear with restricted hearing on the contralateral side: Secondary | ICD-10-CM | POA: Diagnosis not present

## 2024-08-03 DIAGNOSIS — G4700 Insomnia, unspecified: Secondary | ICD-10-CM

## 2024-09-24 ENCOUNTER — Encounter: Payer: Self-pay | Admitting: Internal Medicine

## 2024-10-29 ENCOUNTER — Other Ambulatory Visit: Payer: Self-pay | Admitting: Internal Medicine

## 2024-10-29 DIAGNOSIS — G4700 Insomnia, unspecified: Secondary | ICD-10-CM

## 2025-02-21 ENCOUNTER — Ambulatory Visit (HOSPITAL_BASED_OUTPATIENT_CLINIC_OR_DEPARTMENT_OTHER)
